# Patient Record
Sex: Female | Born: 1935 | Race: White | Hispanic: No | Smoking: Former smoker
Health system: Southern US, Community
[De-identification: ages and names within clinical notes are randomized; demographics above are authoritative.]

## PROBLEM LIST (undated history)

## (undated) DIAGNOSIS — F419 Anxiety disorder, unspecified: Secondary | ICD-10-CM

## (undated) DIAGNOSIS — K219 Gastro-esophageal reflux disease without esophagitis: Secondary | ICD-10-CM

## (undated) DIAGNOSIS — E119 Type 2 diabetes mellitus without complications: Secondary | ICD-10-CM

## (undated) DIAGNOSIS — I10 Essential (primary) hypertension: Secondary | ICD-10-CM

## (undated) DIAGNOSIS — E041 Nontoxic single thyroid nodule: Secondary | ICD-10-CM

## (undated) DIAGNOSIS — C801 Malignant (primary) neoplasm, unspecified: Secondary | ICD-10-CM

## (undated) DIAGNOSIS — G629 Polyneuropathy, unspecified: Secondary | ICD-10-CM

## (undated) DIAGNOSIS — D510 Vitamin B12 deficiency anemia due to intrinsic factor deficiency: Secondary | ICD-10-CM

## (undated) DIAGNOSIS — F329 Major depressive disorder, single episode, unspecified: Secondary | ICD-10-CM

## (undated) DIAGNOSIS — C099 Malignant neoplasm of tonsil, unspecified: Secondary | ICD-10-CM

## (undated) HISTORY — PX: CHOLECYSTECTOMY: SHX55

## (undated) HISTORY — DX: Vitamin B12 deficiency anemia due to intrinsic factor deficiency: D51.0

## (undated) HISTORY — DX: Essential (primary) hypertension: I10

## (undated) HISTORY — DX: Polyneuropathy, unspecified: G62.9

## (undated) HISTORY — DX: Malignant (primary) neoplasm, unspecified: C80.1

## (undated) HISTORY — PX: BLADDER SURGERY: SHX569

## (undated) HISTORY — PX: APPENDECTOMY: SHX54

## (undated) HISTORY — PX: RECTAL SURGERY: SHX760

## (undated) HISTORY — DX: Malignant neoplasm of tonsil, unspecified: C09.9

## (undated) HISTORY — PX: ABDOMINAL HYSTERECTOMY: SHX81

---

## 2009-11-27 ENCOUNTER — Inpatient Hospital Stay (HOSPITAL_COMMUNITY): Admission: EM | Admit: 2009-11-27 | Discharge: 2009-12-01 | Payer: Self-pay | Admitting: Emergency Medicine

## 2010-03-06 ENCOUNTER — Emergency Department (HOSPITAL_COMMUNITY): Admission: EM | Admit: 2010-03-06 | Discharge: 2010-03-06 | Payer: Self-pay | Admitting: Emergency Medicine

## 2010-03-11 ENCOUNTER — Ambulatory Visit (HOSPITAL_COMMUNITY)
Admission: RE | Admit: 2010-03-11 | Discharge: 2010-03-11 | Payer: Self-pay | Source: Home / Self Care | Admitting: Family Medicine

## 2010-03-24 ENCOUNTER — Ambulatory Visit (HOSPITAL_COMMUNITY)
Admission: RE | Admit: 2010-03-24 | Discharge: 2010-03-24 | Payer: Self-pay | Source: Home / Self Care | Admitting: Family Medicine

## 2010-09-15 LAB — COMPREHENSIVE METABOLIC PANEL
ALT: 17 U/L (ref 0–35)
AST: 28 U/L (ref 0–37)
Albumin: 3.5 g/dL (ref 3.5–5.2)
Alkaline Phosphatase: 45 U/L (ref 39–117)
CO2: 25 mEq/L (ref 19–32)
Calcium: 8.8 mg/dL (ref 8.4–10.5)
Chloride: 102 mEq/L (ref 96–112)
GFR calc non Af Amer: >60 mL/min (ref >60)
Total Bilirubin: 0.5 mg/dL (ref 0.3–1.2)
Total Protein: 6.1 g/dL (ref 6.0–8.3)

## 2010-09-15 LAB — DIFFERENTIAL
Basophils Relative: 0 % (ref 0–1)
Eosinophils Relative: 2 % (ref 0–5)
Lymphs Abs: 1.1 10*3/uL (ref 0.7–4.0)
Monocytes Relative: 15 % — ABNORMAL HIGH (ref 3–12)
Neutrophils Relative %: 65 % (ref 43–77)

## 2010-09-15 LAB — URINALYSIS, ROUTINE W REFLEX MICROSCOPIC
Protein, ur: NEGATIVE mg/dL
Urobilinogen, UA: 0.2 mg/dL (ref 0.0–1.0)
pH: 6.5 (ref 5.0–8.0)

## 2010-09-15 LAB — CBC
Hemoglobin: 12 g/dL (ref 12.0–15.0)
RDW: 12.6 % (ref 11.5–15.5)
WBC: 6 10*3/uL (ref 4.0–10.5)

## 2010-09-19 LAB — HEMOCCULT GUIAC POC 1CARD (OFFICE): Fecal Occult Bld: NEGATIVE

## 2010-09-19 LAB — CBC
HCT: 32.6 % — ABNORMAL LOW (ref 36.0–46.0)
HCT: 33.8 % — ABNORMAL LOW (ref 36.0–46.0)
Hemoglobin: 11 g/dL — ABNORMAL LOW (ref 12.0–15.0)
Hemoglobin: 11.5 g/dL — ABNORMAL LOW (ref 12.0–15.0)
MCHC: 33.8 g/dL (ref 30.0–36.0)
MCHC: 34.5 g/dL (ref 30.0–36.0)
MCV: 93.9 fL (ref 78.0–100.0)
MCV: 94.5 fL (ref 78.0–100.0)
Platelets: 215 10*3/uL (ref 150–400)
Platelets: 148 10*3/uL — ABNORMAL LOW (ref 150–400)
Platelets: 174 10*3/uL (ref 150–400)
Platelets: 178 10*3/uL (ref 150–400)
RBC: 3.81 MIL/uL — ABNORMAL LOW (ref 3.87–5.11)
RBC: 3.45 MIL/uL — ABNORMAL LOW (ref 3.87–5.11)
RDW: 12.1 % (ref 11.5–15.5)
RDW: 12.5 % (ref 11.5–15.5)
WBC: 11.9 10*3/uL — ABNORMAL HIGH (ref 4.0–10.5)
WBC: 10.6 10*3/uL — ABNORMAL HIGH (ref 4.0–10.5)
WBC: 8.5 10*3/uL (ref 4.0–10.5)

## 2010-09-19 LAB — CLOSTRIDIUM DIFFICILE EIA: C difficile Toxins A+B, EIA: NEGATIVE

## 2010-09-19 LAB — DIFFERENTIAL
Basophils Absolute: 0 10*3/uL (ref 0.0–0.1)
Basophils Absolute: 0 10*3/uL (ref 0.0–0.1)
Basophils Relative: 0 % (ref 0–1)
Basophils Relative: 0 % (ref 0–1)
Basophils Relative: 0 % (ref 0–1)
Basophils Relative: 1 % (ref 0–1)
Eosinophils Absolute: 0.1 10*3/uL (ref 0.0–0.7)
Eosinophils Absolute: 0.2 10*3/uL (ref 0.0–0.7)
Eosinophils Relative: 2 % (ref 0–5)
Eosinophils Relative: 1 % (ref 0–5)
Eosinophils Relative: 3 % (ref 0–5)
Lymphocytes Relative: 11 % — ABNORMAL LOW (ref 12–46)
Lymphocytes Relative: 12 % (ref 12–46)
Lymphocytes Relative: 15 % (ref 12–46)
Lymphs Abs: 1.3 10*3/uL (ref 0.7–4.0)
Monocytes Absolute: 0.9 10*3/uL (ref 0.1–1.0)
Monocytes Absolute: 1.3 10*3/uL — ABNORMAL HIGH (ref 0.1–1.0)
Monocytes Relative: 8 % (ref 3–12)
Monocytes Relative: 11 % (ref 3–12)
Monocytes Relative: 9 % (ref 3–12)
Neutro Abs: 9.3 10*3/uL — ABNORMAL HIGH (ref 1.7–7.7)
Neutro Abs: 6 10*3/uL (ref 1.7–7.7)
Neutro Abs: 8 10*3/uL — ABNORMAL HIGH (ref 1.7–7.7)
Neutrophils Relative %: 71 % (ref 43–77)
Neutrophils Relative %: 76 % (ref 43–77)

## 2010-09-19 LAB — BASIC METABOLIC PANEL
BUN: 7 mg/dL (ref 6–23)
CO2: 23 mEq/L (ref 19–32)
Calcium: 7.6 mg/dL — ABNORMAL LOW (ref 8.4–10.5)
Chloride: 110 mEq/L (ref 96–112)
Creatinine, Ser: 0.7 mg/dL (ref 0.4–1.2)
GFR calc Af Amer: >60 mL/min (ref >60)
GFR calc non Af Amer: >60 mL/min (ref >60)
GFR calc non Af Amer: >60 mL/min (ref >60)
Glucose, Bld: 129 mg/dL — ABNORMAL HIGH (ref 70–99)
Glucose, Bld: 135 mg/dL — ABNORMAL HIGH (ref 70–99)
Potassium: 4.1 mEq/L (ref 3.5–5.1)
Potassium: 3.4 mEq/L — ABNORMAL LOW (ref 3.5–5.1)
Sodium: 139 mEq/L (ref 135–145)

## 2010-09-19 LAB — COMPREHENSIVE METABOLIC PANEL
Albumin: 3 g/dL — ABNORMAL LOW (ref 3.5–5.2)
Alkaline Phosphatase: 37 U/L — ABNORMAL LOW (ref 39–117)
Alkaline Phosphatase: 43 U/L (ref 39–117)
BUN: 10 mg/dL (ref 6–23)
CO2: 27 mEq/L (ref 19–32)
CO2: 27 mEq/L (ref 19–32)
Calcium: 8.7 mg/dL (ref 8.4–10.5)
Chloride: 100 mEq/L (ref 96–112)
Chloride: 104 mEq/L (ref 96–112)
Creatinine, Ser: 0.78 mg/dL (ref 0.4–1.2)
Creatinine, Ser: 0.83 mg/dL (ref 0.4–1.2)
GFR calc Af Amer: >60 mL/min (ref >60)
GFR calc non Af Amer: >60 mL/min (ref >60)
GFR calc non Af Amer: >60 mL/min (ref >60)
Glucose, Bld: 158 mg/dL — ABNORMAL HIGH (ref 70–99)
Potassium: 2.8 mEq/L — ABNORMAL LOW (ref 3.5–5.1)
Potassium: 3.3 mEq/L — ABNORMAL LOW (ref 3.5–5.1)
Sodium: 136 mEq/L (ref 135–145)
Total Bilirubin: 0.7 mg/dL (ref 0.3–1.2)

## 2010-09-19 LAB — URINALYSIS, ROUTINE W REFLEX MICROSCOPIC
Glucose, UA: NEGATIVE mg/dL
Nitrite: NEGATIVE
Protein, ur: NEGATIVE mg/dL
pH: 5 (ref 5.0–8.0)

## 2010-09-19 LAB — LIPASE, BLOOD: Lipase: 19 U/L (ref 11–59)

## 2010-09-19 LAB — GLUCOSE, CAPILLARY
Glucose-Capillary: 116 mg/dL — ABNORMAL HIGH (ref 70–99)
Glucose-Capillary: 125 mg/dL — ABNORMAL HIGH (ref 70–99)
Glucose-Capillary: 156 mg/dL — ABNORMAL HIGH (ref 70–99)
Glucose-Capillary: 176 mg/dL — ABNORMAL HIGH (ref 70–99)

## 2010-09-19 LAB — MRSA PCR SCREENING: MRSA by PCR: NEGATIVE

## 2010-09-19 LAB — MAGNESIUM: Magnesium: 1.4 mg/dL — ABNORMAL LOW (ref 1.5–2.5)

## 2010-09-19 LAB — CULTURE, BLOOD (ROUTINE X 2): Report Status: 6022011

## 2011-06-20 DIAGNOSIS — M159 Polyosteoarthritis, unspecified: Secondary | ICD-10-CM | POA: Diagnosis not present

## 2011-06-20 DIAGNOSIS — M069 Rheumatoid arthritis, unspecified: Secondary | ICD-10-CM | POA: Diagnosis not present

## 2011-06-20 DIAGNOSIS — I951 Orthostatic hypotension: Secondary | ICD-10-CM | POA: Diagnosis not present

## 2011-07-06 DIAGNOSIS — I10 Essential (primary) hypertension: Secondary | ICD-10-CM | POA: Diagnosis not present

## 2011-07-06 DIAGNOSIS — E785 Hyperlipidemia, unspecified: Secondary | ICD-10-CM | POA: Diagnosis not present

## 2011-07-06 DIAGNOSIS — E119 Type 2 diabetes mellitus without complications: Secondary | ICD-10-CM | POA: Diagnosis not present

## 2011-07-06 DIAGNOSIS — D518 Other vitamin B12 deficiency anemias: Secondary | ICD-10-CM | POA: Diagnosis not present

## 2011-07-10 DIAGNOSIS — M159 Polyosteoarthritis, unspecified: Secondary | ICD-10-CM | POA: Diagnosis not present

## 2011-07-10 DIAGNOSIS — I951 Orthostatic hypotension: Secondary | ICD-10-CM | POA: Diagnosis not present

## 2011-07-10 DIAGNOSIS — M069 Rheumatoid arthritis, unspecified: Secondary | ICD-10-CM | POA: Diagnosis not present

## 2011-07-11 DIAGNOSIS — C44320 Squamous cell carcinoma of skin of unspecified parts of face: Secondary | ICD-10-CM | POA: Diagnosis not present

## 2011-07-11 DIAGNOSIS — L570 Actinic keratosis: Secondary | ICD-10-CM | POA: Diagnosis not present

## 2011-07-17 DIAGNOSIS — G5790 Unspecified mononeuropathy of unspecified lower limb: Secondary | ICD-10-CM | POA: Diagnosis not present

## 2011-07-17 DIAGNOSIS — B351 Tinea unguium: Secondary | ICD-10-CM | POA: Diagnosis not present

## 2011-07-17 DIAGNOSIS — M2040 Other hammer toe(s) (acquired), unspecified foot: Secondary | ICD-10-CM | POA: Diagnosis not present

## 2011-07-17 DIAGNOSIS — E1149 Type 2 diabetes mellitus with other diabetic neurological complication: Secondary | ICD-10-CM | POA: Diagnosis not present

## 2011-07-21 DIAGNOSIS — M159 Polyosteoarthritis, unspecified: Secondary | ICD-10-CM | POA: Diagnosis not present

## 2011-07-21 DIAGNOSIS — M069 Rheumatoid arthritis, unspecified: Secondary | ICD-10-CM | POA: Diagnosis not present

## 2011-07-21 DIAGNOSIS — I951 Orthostatic hypotension: Secondary | ICD-10-CM | POA: Diagnosis not present

## 2011-07-25 DIAGNOSIS — S62009A Unspecified fracture of navicular [scaphoid] bone of unspecified wrist, initial encounter for closed fracture: Secondary | ICD-10-CM | POA: Diagnosis not present

## 2011-07-25 DIAGNOSIS — S60219A Contusion of unspecified wrist, initial encounter: Secondary | ICD-10-CM | POA: Diagnosis not present

## 2011-07-25 DIAGNOSIS — S59919A Unspecified injury of unspecified forearm, initial encounter: Secondary | ICD-10-CM | POA: Diagnosis not present

## 2011-07-28 DIAGNOSIS — M159 Polyosteoarthritis, unspecified: Secondary | ICD-10-CM | POA: Diagnosis not present

## 2011-07-28 DIAGNOSIS — M069 Rheumatoid arthritis, unspecified: Secondary | ICD-10-CM | POA: Diagnosis not present

## 2011-07-28 DIAGNOSIS — I951 Orthostatic hypotension: Secondary | ICD-10-CM | POA: Diagnosis not present

## 2011-08-01 DIAGNOSIS — S62009A Unspecified fracture of navicular [scaphoid] bone of unspecified wrist, initial encounter for closed fracture: Secondary | ICD-10-CM | POA: Diagnosis not present

## 2011-08-01 DIAGNOSIS — S59909A Unspecified injury of unspecified elbow, initial encounter: Secondary | ICD-10-CM | POA: Diagnosis not present

## 2011-08-01 DIAGNOSIS — S60219A Contusion of unspecified wrist, initial encounter: Secondary | ICD-10-CM | POA: Diagnosis not present

## 2011-08-04 DIAGNOSIS — I951 Orthostatic hypotension: Secondary | ICD-10-CM | POA: Diagnosis not present

## 2011-08-04 DIAGNOSIS — M159 Polyosteoarthritis, unspecified: Secondary | ICD-10-CM | POA: Diagnosis not present

## 2011-08-04 DIAGNOSIS — M069 Rheumatoid arthritis, unspecified: Secondary | ICD-10-CM | POA: Diagnosis not present

## 2011-08-08 DIAGNOSIS — S6990XA Unspecified injury of unspecified wrist, hand and finger(s), initial encounter: Secondary | ICD-10-CM | POA: Diagnosis not present

## 2011-08-08 DIAGNOSIS — S59909A Unspecified injury of unspecified elbow, initial encounter: Secondary | ICD-10-CM | POA: Diagnosis not present

## 2011-08-08 DIAGNOSIS — S60219A Contusion of unspecified wrist, initial encounter: Secondary | ICD-10-CM | POA: Diagnosis not present

## 2011-08-08 DIAGNOSIS — S62009A Unspecified fracture of navicular [scaphoid] bone of unspecified wrist, initial encounter for closed fracture: Secondary | ICD-10-CM | POA: Diagnosis not present

## 2011-08-09 DIAGNOSIS — M069 Rheumatoid arthritis, unspecified: Secondary | ICD-10-CM | POA: Diagnosis not present

## 2011-08-09 DIAGNOSIS — I951 Orthostatic hypotension: Secondary | ICD-10-CM | POA: Diagnosis not present

## 2011-08-09 DIAGNOSIS — M159 Polyosteoarthritis, unspecified: Secondary | ICD-10-CM | POA: Diagnosis not present

## 2011-08-10 DIAGNOSIS — M159 Polyosteoarthritis, unspecified: Secondary | ICD-10-CM | POA: Diagnosis not present

## 2011-08-10 DIAGNOSIS — M069 Rheumatoid arthritis, unspecified: Secondary | ICD-10-CM | POA: Diagnosis not present

## 2011-08-10 DIAGNOSIS — I951 Orthostatic hypotension: Secondary | ICD-10-CM | POA: Diagnosis not present

## 2011-08-12 DIAGNOSIS — M069 Rheumatoid arthritis, unspecified: Secondary | ICD-10-CM | POA: Diagnosis not present

## 2011-08-12 DIAGNOSIS — I951 Orthostatic hypotension: Secondary | ICD-10-CM | POA: Diagnosis not present

## 2011-08-12 DIAGNOSIS — M159 Polyosteoarthritis, unspecified: Secondary | ICD-10-CM | POA: Diagnosis not present

## 2011-08-14 DIAGNOSIS — I10 Essential (primary) hypertension: Secondary | ICD-10-CM | POA: Diagnosis not present

## 2011-08-14 DIAGNOSIS — E785 Hyperlipidemia, unspecified: Secondary | ICD-10-CM | POA: Diagnosis not present

## 2011-08-14 DIAGNOSIS — E119 Type 2 diabetes mellitus without complications: Secondary | ICD-10-CM | POA: Diagnosis not present

## 2011-08-14 DIAGNOSIS — D518 Other vitamin B12 deficiency anemias: Secondary | ICD-10-CM | POA: Diagnosis not present

## 2011-08-15 DIAGNOSIS — I951 Orthostatic hypotension: Secondary | ICD-10-CM | POA: Diagnosis not present

## 2011-08-15 DIAGNOSIS — M159 Polyosteoarthritis, unspecified: Secondary | ICD-10-CM | POA: Diagnosis not present

## 2011-08-15 DIAGNOSIS — M069 Rheumatoid arthritis, unspecified: Secondary | ICD-10-CM | POA: Diagnosis not present

## 2011-08-16 DIAGNOSIS — M069 Rheumatoid arthritis, unspecified: Secondary | ICD-10-CM | POA: Diagnosis not present

## 2011-08-16 DIAGNOSIS — M159 Polyosteoarthritis, unspecified: Secondary | ICD-10-CM | POA: Diagnosis not present

## 2011-08-16 DIAGNOSIS — I951 Orthostatic hypotension: Secondary | ICD-10-CM | POA: Diagnosis not present

## 2011-08-18 DIAGNOSIS — I951 Orthostatic hypotension: Secondary | ICD-10-CM | POA: Diagnosis not present

## 2011-08-18 DIAGNOSIS — M069 Rheumatoid arthritis, unspecified: Secondary | ICD-10-CM | POA: Diagnosis not present

## 2011-08-18 DIAGNOSIS — M159 Polyosteoarthritis, unspecified: Secondary | ICD-10-CM | POA: Diagnosis not present

## 2011-08-19 DIAGNOSIS — M159 Polyosteoarthritis, unspecified: Secondary | ICD-10-CM | POA: Diagnosis not present

## 2011-08-19 DIAGNOSIS — Z4789 Encounter for other orthopedic aftercare: Secondary | ICD-10-CM | POA: Diagnosis not present

## 2011-08-19 DIAGNOSIS — S62109A Fracture of unspecified carpal bone, unspecified wrist, initial encounter for closed fracture: Secondary | ICD-10-CM | POA: Diagnosis not present

## 2011-08-19 DIAGNOSIS — E119 Type 2 diabetes mellitus without complications: Secondary | ICD-10-CM | POA: Diagnosis not present

## 2011-08-19 DIAGNOSIS — M069 Rheumatoid arthritis, unspecified: Secondary | ICD-10-CM | POA: Diagnosis not present

## 2011-08-19 DIAGNOSIS — I159 Secondary hypertension, unspecified: Secondary | ICD-10-CM | POA: Diagnosis not present

## 2011-08-21 DIAGNOSIS — Z4789 Encounter for other orthopedic aftercare: Secondary | ICD-10-CM | POA: Diagnosis not present

## 2011-08-21 DIAGNOSIS — M069 Rheumatoid arthritis, unspecified: Secondary | ICD-10-CM | POA: Diagnosis not present

## 2011-08-21 DIAGNOSIS — I159 Secondary hypertension, unspecified: Secondary | ICD-10-CM | POA: Diagnosis not present

## 2011-08-21 DIAGNOSIS — M159 Polyosteoarthritis, unspecified: Secondary | ICD-10-CM | POA: Diagnosis not present

## 2011-08-21 DIAGNOSIS — E119 Type 2 diabetes mellitus without complications: Secondary | ICD-10-CM | POA: Diagnosis not present

## 2011-08-21 DIAGNOSIS — S62109A Fracture of unspecified carpal bone, unspecified wrist, initial encounter for closed fracture: Secondary | ICD-10-CM | POA: Diagnosis not present

## 2011-08-22 DIAGNOSIS — Z4789 Encounter for other orthopedic aftercare: Secondary | ICD-10-CM | POA: Diagnosis not present

## 2011-08-22 DIAGNOSIS — M069 Rheumatoid arthritis, unspecified: Secondary | ICD-10-CM | POA: Diagnosis not present

## 2011-08-22 DIAGNOSIS — I159 Secondary hypertension, unspecified: Secondary | ICD-10-CM | POA: Diagnosis not present

## 2011-08-22 DIAGNOSIS — S62109A Fracture of unspecified carpal bone, unspecified wrist, initial encounter for closed fracture: Secondary | ICD-10-CM | POA: Diagnosis not present

## 2011-08-22 DIAGNOSIS — E119 Type 2 diabetes mellitus without complications: Secondary | ICD-10-CM | POA: Diagnosis not present

## 2011-08-22 DIAGNOSIS — M159 Polyosteoarthritis, unspecified: Secondary | ICD-10-CM | POA: Diagnosis not present

## 2011-08-24 DIAGNOSIS — I159 Secondary hypertension, unspecified: Secondary | ICD-10-CM | POA: Diagnosis not present

## 2011-08-24 DIAGNOSIS — E119 Type 2 diabetes mellitus without complications: Secondary | ICD-10-CM | POA: Diagnosis not present

## 2011-08-24 DIAGNOSIS — Z4789 Encounter for other orthopedic aftercare: Secondary | ICD-10-CM | POA: Diagnosis not present

## 2011-08-24 DIAGNOSIS — M069 Rheumatoid arthritis, unspecified: Secondary | ICD-10-CM | POA: Diagnosis not present

## 2011-08-24 DIAGNOSIS — M159 Polyosteoarthritis, unspecified: Secondary | ICD-10-CM | POA: Diagnosis not present

## 2011-08-24 DIAGNOSIS — S62109A Fracture of unspecified carpal bone, unspecified wrist, initial encounter for closed fracture: Secondary | ICD-10-CM | POA: Diagnosis not present

## 2011-08-25 DIAGNOSIS — M159 Polyosteoarthritis, unspecified: Secondary | ICD-10-CM | POA: Diagnosis not present

## 2011-08-25 DIAGNOSIS — M069 Rheumatoid arthritis, unspecified: Secondary | ICD-10-CM | POA: Diagnosis not present

## 2011-08-25 DIAGNOSIS — I159 Secondary hypertension, unspecified: Secondary | ICD-10-CM | POA: Diagnosis not present

## 2011-08-25 DIAGNOSIS — Z4789 Encounter for other orthopedic aftercare: Secondary | ICD-10-CM | POA: Diagnosis not present

## 2011-08-25 DIAGNOSIS — S62109A Fracture of unspecified carpal bone, unspecified wrist, initial encounter for closed fracture: Secondary | ICD-10-CM | POA: Diagnosis not present

## 2011-08-25 DIAGNOSIS — E119 Type 2 diabetes mellitus without complications: Secondary | ICD-10-CM | POA: Diagnosis not present

## 2011-08-28 DIAGNOSIS — R4789 Other speech disturbances: Secondary | ICD-10-CM | POA: Diagnosis not present

## 2011-08-28 DIAGNOSIS — I10 Essential (primary) hypertension: Secondary | ICD-10-CM | POA: Diagnosis not present

## 2011-08-28 DIAGNOSIS — Z85819 Personal history of malignant neoplasm of unspecified site of lip, oral cavity, and pharynx: Secondary | ICD-10-CM | POA: Diagnosis not present

## 2011-08-28 DIAGNOSIS — N390 Urinary tract infection, site not specified: Secondary | ICD-10-CM | POA: Diagnosis not present

## 2011-08-28 DIAGNOSIS — Z9181 History of falling: Secondary | ICD-10-CM | POA: Diagnosis not present

## 2011-08-28 DIAGNOSIS — R262 Difficulty in walking, not elsewhere classified: Secondary | ICD-10-CM | POA: Diagnosis not present

## 2011-08-28 DIAGNOSIS — IMO0001 Reserved for inherently not codable concepts without codable children: Secondary | ICD-10-CM | POA: Diagnosis not present

## 2011-08-28 DIAGNOSIS — F3289 Other specified depressive episodes: Secondary | ICD-10-CM | POA: Diagnosis not present

## 2011-08-28 DIAGNOSIS — E119 Type 2 diabetes mellitus without complications: Secondary | ICD-10-CM | POA: Diagnosis not present

## 2011-08-28 DIAGNOSIS — F329 Major depressive disorder, single episode, unspecified: Secondary | ICD-10-CM | POA: Diagnosis not present

## 2011-08-28 DIAGNOSIS — G8190 Hemiplegia, unspecified affecting unspecified side: Secondary | ICD-10-CM | POA: Diagnosis not present

## 2011-08-28 DIAGNOSIS — I669 Occlusion and stenosis of unspecified cerebral artery: Secondary | ICD-10-CM | POA: Diagnosis not present

## 2011-08-28 DIAGNOSIS — G609 Hereditary and idiopathic neuropathy, unspecified: Secondary | ICD-10-CM | POA: Diagnosis not present

## 2011-08-28 DIAGNOSIS — R279 Unspecified lack of coordination: Secondary | ICD-10-CM | POA: Diagnosis not present

## 2011-08-28 DIAGNOSIS — R269 Unspecified abnormalities of gait and mobility: Secondary | ICD-10-CM | POA: Diagnosis not present

## 2011-08-28 DIAGNOSIS — Z5189 Encounter for other specified aftercare: Secondary | ICD-10-CM | POA: Diagnosis not present

## 2011-08-28 DIAGNOSIS — W19XXXA Unspecified fall, initial encounter: Secondary | ICD-10-CM | POA: Diagnosis not present

## 2011-08-28 DIAGNOSIS — I6350 Cerebral infarction due to unspecified occlusion or stenosis of unspecified cerebral artery: Secondary | ICD-10-CM | POA: Diagnosis not present

## 2011-08-28 DIAGNOSIS — M1990 Unspecified osteoarthritis, unspecified site: Secondary | ICD-10-CM | POA: Diagnosis not present

## 2011-08-28 DIAGNOSIS — R51 Headache: Secondary | ICD-10-CM | POA: Diagnosis not present

## 2011-08-28 DIAGNOSIS — I6789 Other cerebrovascular disease: Secondary | ICD-10-CM | POA: Diagnosis not present

## 2011-08-28 DIAGNOSIS — F411 Generalized anxiety disorder: Secondary | ICD-10-CM | POA: Diagnosis not present

## 2011-08-28 DIAGNOSIS — D649 Anemia, unspecified: Secondary | ICD-10-CM | POA: Diagnosis not present

## 2011-08-28 DIAGNOSIS — R55 Syncope and collapse: Secondary | ICD-10-CM | POA: Diagnosis not present

## 2011-08-28 DIAGNOSIS — S5290XD Unspecified fracture of unspecified forearm, subsequent encounter for closed fracture with routine healing: Secondary | ICD-10-CM | POA: Diagnosis not present

## 2011-08-31 DIAGNOSIS — M1990 Unspecified osteoarthritis, unspecified site: Secondary | ICD-10-CM | POA: Diagnosis not present

## 2011-08-31 DIAGNOSIS — E1149 Type 2 diabetes mellitus with other diabetic neurological complication: Secondary | ICD-10-CM | POA: Diagnosis not present

## 2011-08-31 DIAGNOSIS — Z5189 Encounter for other specified aftercare: Secondary | ICD-10-CM | POA: Diagnosis not present

## 2011-08-31 DIAGNOSIS — I10 Essential (primary) hypertension: Secondary | ICD-10-CM | POA: Diagnosis not present

## 2011-08-31 DIAGNOSIS — R262 Difficulty in walking, not elsewhere classified: Secondary | ICD-10-CM | POA: Diagnosis not present

## 2011-08-31 DIAGNOSIS — W19XXXA Unspecified fall, initial encounter: Secondary | ICD-10-CM | POA: Diagnosis not present

## 2011-08-31 DIAGNOSIS — R269 Unspecified abnormalities of gait and mobility: Secondary | ICD-10-CM | POA: Diagnosis not present

## 2011-08-31 DIAGNOSIS — D649 Anemia, unspecified: Secondary | ICD-10-CM | POA: Diagnosis not present

## 2011-08-31 DIAGNOSIS — N390 Urinary tract infection, site not specified: Secondary | ICD-10-CM | POA: Diagnosis not present

## 2011-08-31 DIAGNOSIS — G5790 Unspecified mononeuropathy of unspecified lower limb: Secondary | ICD-10-CM | POA: Diagnosis not present

## 2011-08-31 DIAGNOSIS — R4789 Other speech disturbances: Secondary | ICD-10-CM | POA: Diagnosis not present

## 2011-08-31 DIAGNOSIS — S5290XD Unspecified fracture of unspecified forearm, subsequent encounter for closed fracture with routine healing: Secondary | ICD-10-CM | POA: Diagnosis not present

## 2011-08-31 DIAGNOSIS — F411 Generalized anxiety disorder: Secondary | ICD-10-CM | POA: Diagnosis not present

## 2011-08-31 DIAGNOSIS — J309 Allergic rhinitis, unspecified: Secondary | ICD-10-CM | POA: Diagnosis not present

## 2011-08-31 DIAGNOSIS — R279 Unspecified lack of coordination: Secondary | ICD-10-CM | POA: Diagnosis not present

## 2011-08-31 DIAGNOSIS — B351 Tinea unguium: Secondary | ICD-10-CM | POA: Diagnosis not present

## 2011-08-31 DIAGNOSIS — Z4789 Encounter for other orthopedic aftercare: Secondary | ICD-10-CM | POA: Diagnosis not present

## 2011-08-31 DIAGNOSIS — IMO0001 Reserved for inherently not codable concepts without codable children: Secondary | ICD-10-CM | POA: Diagnosis not present

## 2011-08-31 DIAGNOSIS — H8110 Benign paroxysmal vertigo, unspecified ear: Secondary | ICD-10-CM | POA: Diagnosis not present

## 2011-08-31 DIAGNOSIS — G5600 Carpal tunnel syndrome, unspecified upper limb: Secondary | ICD-10-CM | POA: Diagnosis not present

## 2011-08-31 DIAGNOSIS — E119 Type 2 diabetes mellitus without complications: Secondary | ICD-10-CM | POA: Diagnosis not present

## 2011-09-05 DIAGNOSIS — Z4789 Encounter for other orthopedic aftercare: Secondary | ICD-10-CM | POA: Diagnosis not present

## 2011-09-05 DIAGNOSIS — E119 Type 2 diabetes mellitus without complications: Secondary | ICD-10-CM | POA: Diagnosis not present

## 2011-09-05 DIAGNOSIS — N390 Urinary tract infection, site not specified: Secondary | ICD-10-CM | POA: Diagnosis not present

## 2011-09-05 DIAGNOSIS — R269 Unspecified abnormalities of gait and mobility: Secondary | ICD-10-CM | POA: Diagnosis not present

## 2011-09-05 DIAGNOSIS — J309 Allergic rhinitis, unspecified: Secondary | ICD-10-CM | POA: Diagnosis not present

## 2011-09-18 DIAGNOSIS — E1149 Type 2 diabetes mellitus with other diabetic neurological complication: Secondary | ICD-10-CM | POA: Diagnosis not present

## 2011-09-18 DIAGNOSIS — B351 Tinea unguium: Secondary | ICD-10-CM | POA: Diagnosis not present

## 2011-09-18 DIAGNOSIS — G5790 Unspecified mononeuropathy of unspecified lower limb: Secondary | ICD-10-CM | POA: Diagnosis not present

## 2011-09-22 DIAGNOSIS — G5600 Carpal tunnel syndrome, unspecified upper limb: Secondary | ICD-10-CM | POA: Diagnosis not present

## 2011-09-22 DIAGNOSIS — H8110 Benign paroxysmal vertigo, unspecified ear: Secondary | ICD-10-CM | POA: Diagnosis not present

## 2011-10-02 DIAGNOSIS — I159 Secondary hypertension, unspecified: Secondary | ICD-10-CM | POA: Diagnosis not present

## 2011-10-02 DIAGNOSIS — M069 Rheumatoid arthritis, unspecified: Secondary | ICD-10-CM | POA: Diagnosis not present

## 2011-10-02 DIAGNOSIS — Z4789 Encounter for other orthopedic aftercare: Secondary | ICD-10-CM | POA: Diagnosis not present

## 2011-10-02 DIAGNOSIS — S62109A Fracture of unspecified carpal bone, unspecified wrist, initial encounter for closed fracture: Secondary | ICD-10-CM | POA: Diagnosis not present

## 2011-10-02 DIAGNOSIS — M159 Polyosteoarthritis, unspecified: Secondary | ICD-10-CM | POA: Diagnosis not present

## 2011-10-02 DIAGNOSIS — E119 Type 2 diabetes mellitus without complications: Secondary | ICD-10-CM | POA: Diagnosis not present

## 2011-10-03 DIAGNOSIS — S62109A Fracture of unspecified carpal bone, unspecified wrist, initial encounter for closed fracture: Secondary | ICD-10-CM | POA: Diagnosis not present

## 2011-10-03 DIAGNOSIS — I159 Secondary hypertension, unspecified: Secondary | ICD-10-CM | POA: Diagnosis not present

## 2011-10-03 DIAGNOSIS — E119 Type 2 diabetes mellitus without complications: Secondary | ICD-10-CM | POA: Diagnosis not present

## 2011-10-03 DIAGNOSIS — Z4789 Encounter for other orthopedic aftercare: Secondary | ICD-10-CM | POA: Diagnosis not present

## 2011-10-03 DIAGNOSIS — M069 Rheumatoid arthritis, unspecified: Secondary | ICD-10-CM | POA: Diagnosis not present

## 2011-10-03 DIAGNOSIS — M159 Polyosteoarthritis, unspecified: Secondary | ICD-10-CM | POA: Diagnosis not present

## 2011-10-05 DIAGNOSIS — S60219A Contusion of unspecified wrist, initial encounter: Secondary | ICD-10-CM | POA: Diagnosis not present

## 2011-10-05 DIAGNOSIS — Z4789 Encounter for other orthopedic aftercare: Secondary | ICD-10-CM | POA: Diagnosis not present

## 2011-10-05 DIAGNOSIS — M069 Rheumatoid arthritis, unspecified: Secondary | ICD-10-CM | POA: Diagnosis not present

## 2011-10-05 DIAGNOSIS — S59909A Unspecified injury of unspecified elbow, initial encounter: Secondary | ICD-10-CM | POA: Diagnosis not present

## 2011-10-05 DIAGNOSIS — S62009A Unspecified fracture of navicular [scaphoid] bone of unspecified wrist, initial encounter for closed fracture: Secondary | ICD-10-CM | POA: Diagnosis not present

## 2011-10-05 DIAGNOSIS — E119 Type 2 diabetes mellitus without complications: Secondary | ICD-10-CM | POA: Diagnosis not present

## 2011-10-05 DIAGNOSIS — I159 Secondary hypertension, unspecified: Secondary | ICD-10-CM | POA: Diagnosis not present

## 2011-10-05 DIAGNOSIS — S59919A Unspecified injury of unspecified forearm, initial encounter: Secondary | ICD-10-CM | POA: Diagnosis not present

## 2011-10-05 DIAGNOSIS — M159 Polyosteoarthritis, unspecified: Secondary | ICD-10-CM | POA: Diagnosis not present

## 2011-10-05 DIAGNOSIS — S62109A Fracture of unspecified carpal bone, unspecified wrist, initial encounter for closed fracture: Secondary | ICD-10-CM | POA: Diagnosis not present

## 2011-10-06 DIAGNOSIS — M659 Synovitis and tenosynovitis, unspecified: Secondary | ICD-10-CM | POA: Diagnosis not present

## 2011-10-06 DIAGNOSIS — M79609 Pain in unspecified limb: Secondary | ICD-10-CM | POA: Diagnosis not present

## 2011-10-09 DIAGNOSIS — E119 Type 2 diabetes mellitus without complications: Secondary | ICD-10-CM | POA: Diagnosis not present

## 2011-10-09 DIAGNOSIS — I10 Essential (primary) hypertension: Secondary | ICD-10-CM | POA: Diagnosis not present

## 2011-10-09 DIAGNOSIS — D518 Other vitamin B12 deficiency anemias: Secondary | ICD-10-CM | POA: Diagnosis not present

## 2011-10-23 DIAGNOSIS — G4700 Insomnia, unspecified: Secondary | ICD-10-CM | POA: Diagnosis not present

## 2011-10-23 DIAGNOSIS — I10 Essential (primary) hypertension: Secondary | ICD-10-CM | POA: Diagnosis not present

## 2011-10-23 DIAGNOSIS — G5790 Unspecified mononeuropathy of unspecified lower limb: Secondary | ICD-10-CM | POA: Diagnosis not present

## 2011-10-25 DIAGNOSIS — I10 Essential (primary) hypertension: Secondary | ICD-10-CM | POA: Diagnosis not present

## 2011-10-25 DIAGNOSIS — G609 Hereditary and idiopathic neuropathy, unspecified: Secondary | ICD-10-CM | POA: Diagnosis not present

## 2011-10-25 DIAGNOSIS — D510 Vitamin B12 deficiency anemia due to intrinsic factor deficiency: Secondary | ICD-10-CM | POA: Diagnosis not present

## 2011-10-25 DIAGNOSIS — Z794 Long term (current) use of insulin: Secondary | ICD-10-CM | POA: Diagnosis not present

## 2011-10-25 DIAGNOSIS — E119 Type 2 diabetes mellitus without complications: Secondary | ICD-10-CM | POA: Diagnosis not present

## 2011-10-25 DIAGNOSIS — R279 Unspecified lack of coordination: Secondary | ICD-10-CM | POA: Diagnosis not present

## 2011-10-28 DIAGNOSIS — D510 Vitamin B12 deficiency anemia due to intrinsic factor deficiency: Secondary | ICD-10-CM | POA: Diagnosis not present

## 2011-10-28 DIAGNOSIS — Z794 Long term (current) use of insulin: Secondary | ICD-10-CM | POA: Diagnosis not present

## 2011-10-28 DIAGNOSIS — I10 Essential (primary) hypertension: Secondary | ICD-10-CM | POA: Diagnosis not present

## 2011-10-28 DIAGNOSIS — G609 Hereditary and idiopathic neuropathy, unspecified: Secondary | ICD-10-CM | POA: Diagnosis not present

## 2011-10-28 DIAGNOSIS — R279 Unspecified lack of coordination: Secondary | ICD-10-CM | POA: Diagnosis not present

## 2011-10-28 DIAGNOSIS — E119 Type 2 diabetes mellitus without complications: Secondary | ICD-10-CM | POA: Diagnosis not present

## 2011-11-01 DIAGNOSIS — I10 Essential (primary) hypertension: Secondary | ICD-10-CM | POA: Diagnosis not present

## 2011-11-01 DIAGNOSIS — Z794 Long term (current) use of insulin: Secondary | ICD-10-CM | POA: Diagnosis not present

## 2011-11-01 DIAGNOSIS — E119 Type 2 diabetes mellitus without complications: Secondary | ICD-10-CM | POA: Diagnosis not present

## 2011-11-01 DIAGNOSIS — R279 Unspecified lack of coordination: Secondary | ICD-10-CM | POA: Diagnosis not present

## 2011-11-01 DIAGNOSIS — G609 Hereditary and idiopathic neuropathy, unspecified: Secondary | ICD-10-CM | POA: Diagnosis not present

## 2011-11-01 DIAGNOSIS — D510 Vitamin B12 deficiency anemia due to intrinsic factor deficiency: Secondary | ICD-10-CM | POA: Diagnosis not present

## 2011-11-02 DIAGNOSIS — E119 Type 2 diabetes mellitus without complications: Secondary | ICD-10-CM | POA: Diagnosis not present

## 2011-11-02 DIAGNOSIS — G609 Hereditary and idiopathic neuropathy, unspecified: Secondary | ICD-10-CM | POA: Diagnosis not present

## 2011-11-02 DIAGNOSIS — I10 Essential (primary) hypertension: Secondary | ICD-10-CM | POA: Diagnosis not present

## 2011-11-02 DIAGNOSIS — R279 Unspecified lack of coordination: Secondary | ICD-10-CM | POA: Diagnosis not present

## 2011-11-02 DIAGNOSIS — D510 Vitamin B12 deficiency anemia due to intrinsic factor deficiency: Secondary | ICD-10-CM | POA: Diagnosis not present

## 2011-11-02 DIAGNOSIS — Z794 Long term (current) use of insulin: Secondary | ICD-10-CM | POA: Diagnosis not present

## 2011-11-04 DIAGNOSIS — D510 Vitamin B12 deficiency anemia due to intrinsic factor deficiency: Secondary | ICD-10-CM | POA: Diagnosis not present

## 2011-11-04 DIAGNOSIS — R279 Unspecified lack of coordination: Secondary | ICD-10-CM | POA: Diagnosis not present

## 2011-11-04 DIAGNOSIS — Z794 Long term (current) use of insulin: Secondary | ICD-10-CM | POA: Diagnosis not present

## 2011-11-04 DIAGNOSIS — E119 Type 2 diabetes mellitus without complications: Secondary | ICD-10-CM | POA: Diagnosis not present

## 2011-11-04 DIAGNOSIS — G609 Hereditary and idiopathic neuropathy, unspecified: Secondary | ICD-10-CM | POA: Diagnosis not present

## 2011-11-04 DIAGNOSIS — I10 Essential (primary) hypertension: Secondary | ICD-10-CM | POA: Diagnosis not present

## 2011-11-06 DIAGNOSIS — D510 Vitamin B12 deficiency anemia due to intrinsic factor deficiency: Secondary | ICD-10-CM | POA: Diagnosis not present

## 2011-11-06 DIAGNOSIS — R279 Unspecified lack of coordination: Secondary | ICD-10-CM | POA: Diagnosis not present

## 2011-11-06 DIAGNOSIS — Z794 Long term (current) use of insulin: Secondary | ICD-10-CM | POA: Diagnosis not present

## 2011-11-06 DIAGNOSIS — G609 Hereditary and idiopathic neuropathy, unspecified: Secondary | ICD-10-CM | POA: Diagnosis not present

## 2011-11-06 DIAGNOSIS — E119 Type 2 diabetes mellitus without complications: Secondary | ICD-10-CM | POA: Diagnosis not present

## 2011-11-06 DIAGNOSIS — I10 Essential (primary) hypertension: Secondary | ICD-10-CM | POA: Diagnosis not present

## 2011-11-06 DIAGNOSIS — I69993 Ataxia following unspecified cerebrovascular disease: Secondary | ICD-10-CM | POA: Diagnosis not present

## 2011-11-08 DIAGNOSIS — D510 Vitamin B12 deficiency anemia due to intrinsic factor deficiency: Secondary | ICD-10-CM | POA: Diagnosis not present

## 2011-11-08 DIAGNOSIS — Z794 Long term (current) use of insulin: Secondary | ICD-10-CM | POA: Diagnosis not present

## 2011-11-08 DIAGNOSIS — G609 Hereditary and idiopathic neuropathy, unspecified: Secondary | ICD-10-CM | POA: Diagnosis not present

## 2011-11-08 DIAGNOSIS — R279 Unspecified lack of coordination: Secondary | ICD-10-CM | POA: Diagnosis not present

## 2011-11-08 DIAGNOSIS — I10 Essential (primary) hypertension: Secondary | ICD-10-CM | POA: Diagnosis not present

## 2011-11-08 DIAGNOSIS — E119 Type 2 diabetes mellitus without complications: Secondary | ICD-10-CM | POA: Diagnosis not present

## 2011-11-10 DIAGNOSIS — D510 Vitamin B12 deficiency anemia due to intrinsic factor deficiency: Secondary | ICD-10-CM | POA: Diagnosis not present

## 2011-11-10 DIAGNOSIS — R279 Unspecified lack of coordination: Secondary | ICD-10-CM | POA: Diagnosis not present

## 2011-11-10 DIAGNOSIS — Z794 Long term (current) use of insulin: Secondary | ICD-10-CM | POA: Diagnosis not present

## 2011-11-10 DIAGNOSIS — I10 Essential (primary) hypertension: Secondary | ICD-10-CM | POA: Diagnosis not present

## 2011-11-10 DIAGNOSIS — G609 Hereditary and idiopathic neuropathy, unspecified: Secondary | ICD-10-CM | POA: Diagnosis not present

## 2011-11-10 DIAGNOSIS — E119 Type 2 diabetes mellitus without complications: Secondary | ICD-10-CM | POA: Diagnosis not present

## 2011-11-14 DIAGNOSIS — I10 Essential (primary) hypertension: Secondary | ICD-10-CM | POA: Diagnosis not present

## 2011-11-14 DIAGNOSIS — D510 Vitamin B12 deficiency anemia due to intrinsic factor deficiency: Secondary | ICD-10-CM | POA: Diagnosis not present

## 2011-11-14 DIAGNOSIS — E119 Type 2 diabetes mellitus without complications: Secondary | ICD-10-CM | POA: Diagnosis not present

## 2011-11-14 DIAGNOSIS — R279 Unspecified lack of coordination: Secondary | ICD-10-CM | POA: Diagnosis not present

## 2011-11-14 DIAGNOSIS — G609 Hereditary and idiopathic neuropathy, unspecified: Secondary | ICD-10-CM | POA: Diagnosis not present

## 2011-11-14 DIAGNOSIS — Z794 Long term (current) use of insulin: Secondary | ICD-10-CM | POA: Diagnosis not present

## 2011-11-15 DIAGNOSIS — I10 Essential (primary) hypertension: Secondary | ICD-10-CM | POA: Diagnosis not present

## 2011-11-15 DIAGNOSIS — Z794 Long term (current) use of insulin: Secondary | ICD-10-CM | POA: Diagnosis not present

## 2011-11-15 DIAGNOSIS — R279 Unspecified lack of coordination: Secondary | ICD-10-CM | POA: Diagnosis not present

## 2011-11-15 DIAGNOSIS — G609 Hereditary and idiopathic neuropathy, unspecified: Secondary | ICD-10-CM | POA: Diagnosis not present

## 2011-11-15 DIAGNOSIS — E119 Type 2 diabetes mellitus without complications: Secondary | ICD-10-CM | POA: Diagnosis not present

## 2011-11-15 DIAGNOSIS — D510 Vitamin B12 deficiency anemia due to intrinsic factor deficiency: Secondary | ICD-10-CM | POA: Diagnosis not present

## 2011-11-20 DIAGNOSIS — R279 Unspecified lack of coordination: Secondary | ICD-10-CM | POA: Diagnosis not present

## 2011-11-20 DIAGNOSIS — D510 Vitamin B12 deficiency anemia due to intrinsic factor deficiency: Secondary | ICD-10-CM | POA: Diagnosis not present

## 2011-11-20 DIAGNOSIS — G609 Hereditary and idiopathic neuropathy, unspecified: Secondary | ICD-10-CM | POA: Diagnosis not present

## 2011-11-20 DIAGNOSIS — Z794 Long term (current) use of insulin: Secondary | ICD-10-CM | POA: Diagnosis not present

## 2011-11-20 DIAGNOSIS — I10 Essential (primary) hypertension: Secondary | ICD-10-CM | POA: Diagnosis not present

## 2011-11-20 DIAGNOSIS — E119 Type 2 diabetes mellitus without complications: Secondary | ICD-10-CM | POA: Diagnosis not present

## 2011-11-22 DIAGNOSIS — I10 Essential (primary) hypertension: Secondary | ICD-10-CM | POA: Diagnosis not present

## 2011-11-22 DIAGNOSIS — Z794 Long term (current) use of insulin: Secondary | ICD-10-CM | POA: Diagnosis not present

## 2011-11-22 DIAGNOSIS — G609 Hereditary and idiopathic neuropathy, unspecified: Secondary | ICD-10-CM | POA: Diagnosis not present

## 2011-11-22 DIAGNOSIS — D510 Vitamin B12 deficiency anemia due to intrinsic factor deficiency: Secondary | ICD-10-CM | POA: Diagnosis not present

## 2011-11-22 DIAGNOSIS — E119 Type 2 diabetes mellitus without complications: Secondary | ICD-10-CM | POA: Diagnosis not present

## 2011-11-22 DIAGNOSIS — R279 Unspecified lack of coordination: Secondary | ICD-10-CM | POA: Diagnosis not present

## 2011-11-24 DIAGNOSIS — R358 Other polyuria: Secondary | ICD-10-CM | POA: Diagnosis not present

## 2011-11-24 DIAGNOSIS — I10 Essential (primary) hypertension: Secondary | ICD-10-CM | POA: Diagnosis not present

## 2011-11-24 DIAGNOSIS — R3589 Other polyuria: Secondary | ICD-10-CM | POA: Diagnosis not present

## 2011-11-24 DIAGNOSIS — R52 Pain, unspecified: Secondary | ICD-10-CM | POA: Diagnosis not present

## 2011-11-24 DIAGNOSIS — R300 Dysuria: Secondary | ICD-10-CM | POA: Diagnosis not present

## 2011-11-24 DIAGNOSIS — J301 Allergic rhinitis due to pollen: Secondary | ICD-10-CM | POA: Diagnosis not present

## 2011-11-28 DIAGNOSIS — E119 Type 2 diabetes mellitus without complications: Secondary | ICD-10-CM | POA: Diagnosis not present

## 2011-11-28 DIAGNOSIS — Z794 Long term (current) use of insulin: Secondary | ICD-10-CM | POA: Diagnosis not present

## 2011-11-28 DIAGNOSIS — I10 Essential (primary) hypertension: Secondary | ICD-10-CM | POA: Diagnosis not present

## 2011-11-28 DIAGNOSIS — R279 Unspecified lack of coordination: Secondary | ICD-10-CM | POA: Diagnosis not present

## 2011-11-28 DIAGNOSIS — D510 Vitamin B12 deficiency anemia due to intrinsic factor deficiency: Secondary | ICD-10-CM | POA: Diagnosis not present

## 2011-11-28 DIAGNOSIS — G609 Hereditary and idiopathic neuropathy, unspecified: Secondary | ICD-10-CM | POA: Diagnosis not present

## 2011-11-29 DIAGNOSIS — Z794 Long term (current) use of insulin: Secondary | ICD-10-CM | POA: Diagnosis not present

## 2011-11-29 DIAGNOSIS — D510 Vitamin B12 deficiency anemia due to intrinsic factor deficiency: Secondary | ICD-10-CM | POA: Diagnosis not present

## 2011-11-29 DIAGNOSIS — G609 Hereditary and idiopathic neuropathy, unspecified: Secondary | ICD-10-CM | POA: Diagnosis not present

## 2011-11-29 DIAGNOSIS — I10 Essential (primary) hypertension: Secondary | ICD-10-CM | POA: Diagnosis not present

## 2011-11-29 DIAGNOSIS — R279 Unspecified lack of coordination: Secondary | ICD-10-CM | POA: Diagnosis not present

## 2011-11-29 DIAGNOSIS — E119 Type 2 diabetes mellitus without complications: Secondary | ICD-10-CM | POA: Diagnosis not present

## 2011-12-06 DIAGNOSIS — G609 Hereditary and idiopathic neuropathy, unspecified: Secondary | ICD-10-CM | POA: Diagnosis not present

## 2011-12-06 DIAGNOSIS — R279 Unspecified lack of coordination: Secondary | ICD-10-CM | POA: Diagnosis not present

## 2011-12-06 DIAGNOSIS — Z794 Long term (current) use of insulin: Secondary | ICD-10-CM | POA: Diagnosis not present

## 2011-12-06 DIAGNOSIS — E119 Type 2 diabetes mellitus without complications: Secondary | ICD-10-CM | POA: Diagnosis not present

## 2011-12-06 DIAGNOSIS — I10 Essential (primary) hypertension: Secondary | ICD-10-CM | POA: Diagnosis not present

## 2011-12-06 DIAGNOSIS — D510 Vitamin B12 deficiency anemia due to intrinsic factor deficiency: Secondary | ICD-10-CM | POA: Diagnosis not present

## 2011-12-25 DIAGNOSIS — M2550 Pain in unspecified joint: Secondary | ICD-10-CM | POA: Diagnosis not present

## 2011-12-25 DIAGNOSIS — R7309 Other abnormal glucose: Secondary | ICD-10-CM | POA: Diagnosis not present

## 2011-12-25 DIAGNOSIS — R32 Unspecified urinary incontinence: Secondary | ICD-10-CM | POA: Diagnosis not present

## 2011-12-25 DIAGNOSIS — D510 Vitamin B12 deficiency anemia due to intrinsic factor deficiency: Secondary | ICD-10-CM | POA: Diagnosis not present

## 2012-01-09 DIAGNOSIS — I10 Essential (primary) hypertension: Secondary | ICD-10-CM | POA: Diagnosis not present

## 2012-01-09 DIAGNOSIS — M2550 Pain in unspecified joint: Secondary | ICD-10-CM | POA: Diagnosis not present

## 2012-01-29 DIAGNOSIS — M545 Low back pain: Secondary | ICD-10-CM | POA: Diagnosis not present

## 2012-01-29 DIAGNOSIS — D510 Vitamin B12 deficiency anemia due to intrinsic factor deficiency: Secondary | ICD-10-CM | POA: Diagnosis not present

## 2012-01-29 DIAGNOSIS — I10 Essential (primary) hypertension: Secondary | ICD-10-CM | POA: Diagnosis not present

## 2012-02-15 DIAGNOSIS — R609 Edema, unspecified: Secondary | ICD-10-CM | POA: Diagnosis not present

## 2012-03-06 DIAGNOSIS — Z23 Encounter for immunization: Secondary | ICD-10-CM | POA: Diagnosis not present

## 2012-03-06 DIAGNOSIS — D518 Other vitamin B12 deficiency anemias: Secondary | ICD-10-CM | POA: Diagnosis not present

## 2012-03-06 DIAGNOSIS — E119 Type 2 diabetes mellitus without complications: Secondary | ICD-10-CM | POA: Diagnosis not present

## 2012-03-06 DIAGNOSIS — I10 Essential (primary) hypertension: Secondary | ICD-10-CM | POA: Diagnosis not present

## 2012-04-04 DIAGNOSIS — D518 Other vitamin B12 deficiency anemias: Secondary | ICD-10-CM | POA: Diagnosis not present

## 2012-04-04 DIAGNOSIS — E119 Type 2 diabetes mellitus without complications: Secondary | ICD-10-CM | POA: Diagnosis not present

## 2012-04-04 DIAGNOSIS — I10 Essential (primary) hypertension: Secondary | ICD-10-CM | POA: Diagnosis not present

## 2012-05-01 DIAGNOSIS — R5383 Other fatigue: Secondary | ICD-10-CM | POA: Diagnosis not present

## 2012-05-01 DIAGNOSIS — D518 Other vitamin B12 deficiency anemias: Secondary | ICD-10-CM | POA: Diagnosis not present

## 2012-05-01 DIAGNOSIS — E119 Type 2 diabetes mellitus without complications: Secondary | ICD-10-CM | POA: Diagnosis not present

## 2012-05-06 DIAGNOSIS — E042 Nontoxic multinodular goiter: Secondary | ICD-10-CM | POA: Diagnosis not present

## 2012-05-06 DIAGNOSIS — I10 Essential (primary) hypertension: Secondary | ICD-10-CM | POA: Diagnosis not present

## 2012-05-06 DIAGNOSIS — D518 Other vitamin B12 deficiency anemias: Secondary | ICD-10-CM | POA: Diagnosis not present

## 2012-05-22 DIAGNOSIS — R498 Other voice and resonance disorders: Secondary | ICD-10-CM | POA: Diagnosis not present

## 2012-05-27 DIAGNOSIS — E119 Type 2 diabetes mellitus without complications: Secondary | ICD-10-CM | POA: Diagnosis not present

## 2012-05-28 DIAGNOSIS — D485 Neoplasm of uncertain behavior of skin: Secondary | ICD-10-CM | POA: Diagnosis not present

## 2012-05-28 DIAGNOSIS — L578 Other skin changes due to chronic exposure to nonionizing radiation: Secondary | ICD-10-CM | POA: Diagnosis not present

## 2012-05-28 DIAGNOSIS — Z85820 Personal history of malignant melanoma of skin: Secondary | ICD-10-CM | POA: Diagnosis not present

## 2012-05-28 DIAGNOSIS — L820 Inflamed seborrheic keratosis: Secondary | ICD-10-CM | POA: Diagnosis not present

## 2012-05-28 DIAGNOSIS — L570 Actinic keratosis: Secondary | ICD-10-CM | POA: Diagnosis not present

## 2012-05-28 DIAGNOSIS — C44711 Basal cell carcinoma of skin of unspecified lower limb, including hip: Secondary | ICD-10-CM | POA: Diagnosis not present

## 2012-05-28 DIAGNOSIS — Z85828 Personal history of other malignant neoplasm of skin: Secondary | ICD-10-CM | POA: Diagnosis not present

## 2012-06-04 DIAGNOSIS — E042 Nontoxic multinodular goiter: Secondary | ICD-10-CM | POA: Diagnosis not present

## 2012-06-04 DIAGNOSIS — D518 Other vitamin B12 deficiency anemias: Secondary | ICD-10-CM | POA: Diagnosis not present

## 2012-06-04 DIAGNOSIS — R1310 Dysphagia, unspecified: Secondary | ICD-10-CM | POA: Diagnosis not present

## 2012-06-05 DIAGNOSIS — L820 Inflamed seborrheic keratosis: Secondary | ICD-10-CM | POA: Diagnosis not present

## 2012-06-05 DIAGNOSIS — C44711 Basal cell carcinoma of skin of unspecified lower limb, including hip: Secondary | ICD-10-CM | POA: Diagnosis not present

## 2012-07-02 DIAGNOSIS — D518 Other vitamin B12 deficiency anemias: Secondary | ICD-10-CM | POA: Diagnosis not present

## 2012-07-10 DIAGNOSIS — R197 Diarrhea, unspecified: Secondary | ICD-10-CM | POA: Diagnosis not present

## 2012-07-10 DIAGNOSIS — R1310 Dysphagia, unspecified: Secondary | ICD-10-CM | POA: Diagnosis not present

## 2012-07-10 DIAGNOSIS — Z86010 Personal history of colonic polyps: Secondary | ICD-10-CM | POA: Diagnosis not present

## 2012-07-17 DIAGNOSIS — R1310 Dysphagia, unspecified: Secondary | ICD-10-CM | POA: Diagnosis not present

## 2012-07-17 DIAGNOSIS — R197 Diarrhea, unspecified: Secondary | ICD-10-CM | POA: Diagnosis not present

## 2012-07-17 DIAGNOSIS — K2980 Duodenitis without bleeding: Secondary | ICD-10-CM | POA: Diagnosis not present

## 2012-07-17 DIAGNOSIS — D490 Neoplasm of unspecified behavior of digestive system: Secondary | ICD-10-CM | POA: Diagnosis not present

## 2012-08-20 DIAGNOSIS — I6529 Occlusion and stenosis of unspecified carotid artery: Secondary | ICD-10-CM | POA: Diagnosis not present

## 2012-08-22 DIAGNOSIS — I10 Essential (primary) hypertension: Secondary | ICD-10-CM | POA: Diagnosis not present

## 2012-08-22 DIAGNOSIS — E119 Type 2 diabetes mellitus without complications: Secondary | ICD-10-CM | POA: Diagnosis not present

## 2012-08-22 DIAGNOSIS — J019 Acute sinusitis, unspecified: Secondary | ICD-10-CM | POA: Diagnosis not present

## 2012-08-22 DIAGNOSIS — D518 Other vitamin B12 deficiency anemias: Secondary | ICD-10-CM | POA: Diagnosis not present

## 2012-09-10 DIAGNOSIS — I10 Essential (primary) hypertension: Secondary | ICD-10-CM | POA: Diagnosis not present

## 2012-09-10 DIAGNOSIS — E785 Hyperlipidemia, unspecified: Secondary | ICD-10-CM | POA: Diagnosis not present

## 2012-09-17 DIAGNOSIS — D518 Other vitamin B12 deficiency anemias: Secondary | ICD-10-CM | POA: Diagnosis not present

## 2012-09-17 DIAGNOSIS — J019 Acute sinusitis, unspecified: Secondary | ICD-10-CM | POA: Diagnosis not present

## 2012-09-17 DIAGNOSIS — I10 Essential (primary) hypertension: Secondary | ICD-10-CM | POA: Diagnosis not present

## 2012-09-17 DIAGNOSIS — E119 Type 2 diabetes mellitus without complications: Secondary | ICD-10-CM | POA: Diagnosis not present

## 2012-10-16 DIAGNOSIS — D518 Other vitamin B12 deficiency anemias: Secondary | ICD-10-CM | POA: Diagnosis not present

## 2012-10-28 DIAGNOSIS — S82409A Unspecified fracture of shaft of unspecified fibula, initial encounter for closed fracture: Secondary | ICD-10-CM | POA: Diagnosis not present

## 2012-11-12 DIAGNOSIS — S8290XD Unspecified fracture of unspecified lower leg, subsequent encounter for closed fracture with routine healing: Secondary | ICD-10-CM | POA: Diagnosis not present

## 2012-11-18 ENCOUNTER — Encounter: Payer: Self-pay | Admitting: *Deleted

## 2012-11-20 ENCOUNTER — Ambulatory Visit (INDEPENDENT_AMBULATORY_CARE_PROVIDER_SITE_OTHER): Payer: Medicare Other | Admitting: Family Medicine

## 2012-11-20 ENCOUNTER — Encounter: Payer: Self-pay | Admitting: Family Medicine

## 2012-11-20 VITALS — BP 130/68 | HR 80

## 2012-11-20 DIAGNOSIS — E119 Type 2 diabetes mellitus without complications: Secondary | ICD-10-CM | POA: Diagnosis not present

## 2012-11-20 DIAGNOSIS — E785 Hyperlipidemia, unspecified: Secondary | ICD-10-CM

## 2012-11-20 DIAGNOSIS — R109 Unspecified abdominal pain: Secondary | ICD-10-CM | POA: Diagnosis not present

## 2012-11-20 DIAGNOSIS — D510 Vitamin B12 deficiency anemia due to intrinsic factor deficiency: Secondary | ICD-10-CM | POA: Diagnosis not present

## 2012-11-20 DIAGNOSIS — N289 Disorder of kidney and ureter, unspecified: Secondary | ICD-10-CM | POA: Diagnosis not present

## 2012-11-20 DIAGNOSIS — Z0000 Encounter for general adult medical examination without abnormal findings: Secondary | ICD-10-CM

## 2012-11-20 MED ORDER — PRAVASTATIN SODIUM 20 MG PO TABS: 20.0000 mg | ORAL_TABLET | Freq: Every day | ORAL | Status: DC

## 2012-11-20 MED ORDER — HYOSCYAMINE SULFATE 0.125 MG SL SUBL: 0.1250 mg | SUBLINGUAL_TABLET | Freq: Three times a day (TID) | SUBLINGUAL | Status: DC | PRN

## 2012-11-20 MED ORDER — LOSARTAN POTASSIUM 100 MG PO TABS: 100.0000 mg | ORAL_TABLET | Freq: Every day | ORAL | Status: DC

## 2012-11-20 MED ORDER — FLUTICASONE PROPIONATE 50 MCG/ACT NA SUSP: 1.0000 | Freq: Every day | NASAL | Status: DC

## 2012-11-20 MED ORDER — GLIPIZIDE 5 MG PO TABS: 5.0000 mg | ORAL_TABLET | Freq: Two times a day (BID) | ORAL | Status: DC

## 2012-11-20 MED ORDER — CITALOPRAM HYDROBROMIDE 20 MG PO TABS: 20.0000 mg | ORAL_TABLET | Freq: Every day | ORAL | Status: DC

## 2012-11-20 MED ORDER — NABUMETONE 500 MG PO TABS: 500.0000 mg | ORAL_TABLET | Freq: Two times a day (BID) | ORAL | Status: DC | PRN

## 2012-11-20 MED ORDER — TRAMADOL HCL 50 MG PO TABS: 50.0000 mg | ORAL_TABLET | Freq: Three times a day (TID) | ORAL | Status: DC | PRN

## 2012-11-20 MED ORDER — METFORMIN HCL 500 MG PO TABS: 500.0000 mg | ORAL_TABLET | Freq: Two times a day (BID) | ORAL | Status: DC

## 2012-11-20 MED ORDER — MECLIZINE HCL 12.5 MG PO TABS: 12.5000 mg | ORAL_TABLET | ORAL | Status: DC | PRN

## 2012-11-20 MED ORDER — PANTOPRAZOLE SODIUM 20 MG PO TBEC: 20.0000 mg | DELAYED_RELEASE_TABLET | Freq: Every day | ORAL | Status: DC

## 2012-11-20 MED ORDER — METOPROLOL SUCCINATE ER 25 MG PO TB24: 25.0000 mg | ORAL_TABLET | Freq: Every day | ORAL | Status: DC

## 2012-11-20 MED ORDER — CETIRIZINE HCL 10 MG PO TABS: 10.0000 mg | ORAL_TABLET | Freq: Every day | ORAL | Status: DC

## 2012-11-20 MED ORDER — BUSPIRONE HCL 15 MG PO TABS: ORAL_TABLET | ORAL | Status: DC

## 2012-11-20 MED ORDER — CYANOCOBALAMIN 1000 MCG/ML IJ SOLN
1000.0000 ug | Freq: Once | INTRAMUSCULAR | Status: AC
  Administered 2012-11-20: 1000 ug via INTRAMUSCULAR

## 2012-11-20 MED ORDER — CLONIDINE HCL 0.2 MG PO TABS: 0.2000 mg | ORAL_TABLET | Freq: Two times a day (BID) | ORAL | Status: DC

## 2012-11-20 NOTE — Progress Notes (Signed)
  Subjective:    Patient ID: Meghan Duran, female    DOB: May 06, 1924, 79 y.o.   MRN: [id number]  Diabetes She presents for her follow-up diabetic visit. She has type 2 diabetes mellitus. She is compliant with treatment all of the time. Her breakfast blood glucose range is generally 110-130 mg/dl. Her dinner blood glucose is taken between 4-5 pm. Her dinner blood glucose range is generally 110-130 mg/dl. She sees a podiatrist.Eye exam current: November 2013.  Hyperlipidemia-she does try to follow a good diet. Takes a medicine on a regular basis. Had recent lab work. Hypertension-takes a medicine on a regular basis denies any problem no headaches. Needs refills. Abdominal bloating intermittently she has a feeling of bloating it's not severe pain no bloody stools denies vomiting diarrhea sweats or chills with it. Has fracture of the right ankle she is followed by orthopedics difficulty getting around with her boot She will be going into a nursing home/rest home [facility] she will followup here every 3-4 months. She has a history of pernicious anemia need some monthly B12 shots  Family history noncontributory please see sheet   Dr. Charlett Blake June 2 wering moonboot for 2 more weeks Review of Systems She denies chest tightness pressure pain shortness of breath no swelling in the legs. Some joint pains and discomfort please see above.    Objective:   Physical Exam Blood pressure good, lungs are clear no crackles heart is regular pulse normal abdomen is soft extremities has moonboot on the right foot left side is normal       Assessment & Plan:  DM-Overall she's doing good with this follow diet closely. Take her medications. Will see back in the proximal Lane 3-4 months. Recheck A1c. hyperlip-Continue diet continue medication. Monitor blood work twice yearly HTN-Overall doing good continue current medications. Watch diet closely abd pain-Bloating. Try Levsin SL when necessary see if this doesn't  help no need for any extensive testing currently.

## 2012-12-03 DIAGNOSIS — S8290XD Unspecified fracture of unspecified lower leg, subsequent encounter for closed fracture with routine healing: Secondary | ICD-10-CM | POA: Diagnosis not present

## 2012-12-10 DIAGNOSIS — M19079 Primary osteoarthritis, unspecified ankle and foot: Secondary | ICD-10-CM | POA: Diagnosis not present

## 2012-12-13 ENCOUNTER — Other Ambulatory Visit (INDEPENDENT_AMBULATORY_CARE_PROVIDER_SITE_OTHER): Payer: Medicare Other | Admitting: *Deleted

## 2012-12-13 DIAGNOSIS — D509 Iron deficiency anemia, unspecified: Secondary | ICD-10-CM | POA: Diagnosis not present

## 2012-12-13 DIAGNOSIS — N390 Urinary tract infection, site not specified: Secondary | ICD-10-CM | POA: Diagnosis not present

## 2012-12-13 DIAGNOSIS — Z0000 Encounter for general adult medical examination without abnormal findings: Secondary | ICD-10-CM | POA: Diagnosis not present

## 2012-12-13 DIAGNOSIS — D510 Vitamin B12 deficiency anemia due to intrinsic factor deficiency: Secondary | ICD-10-CM | POA: Diagnosis not present

## 2012-12-13 LAB — POC HEMOCCULT BLD/STL (HOME/3-CARD/SCREEN)
Card #2 Fecal Occult Blod, POC: POSITIVE
Card #3 Fecal Occult Blood, POC: POSITIVE

## 2012-12-23 NOTE — Progress Notes (Signed)
[facility] with Lupita Leash her daughter 12/23/12

## 2012-12-24 ENCOUNTER — Other Ambulatory Visit: Payer: Self-pay | Admitting: Family Medicine

## 2012-12-24 ENCOUNTER — Ambulatory Visit (INDEPENDENT_AMBULATORY_CARE_PROVIDER_SITE_OTHER): Payer: Medicare Other | Admitting: *Deleted

## 2012-12-24 DIAGNOSIS — D510 Vitamin B12 deficiency anemia due to intrinsic factor deficiency: Secondary | ICD-10-CM

## 2012-12-24 DIAGNOSIS — Z0000 Encounter for general adult medical examination without abnormal findings: Secondary | ICD-10-CM | POA: Diagnosis not present

## 2012-12-24 DIAGNOSIS — D509 Iron deficiency anemia, unspecified: Secondary | ICD-10-CM | POA: Diagnosis not present

## 2012-12-24 DIAGNOSIS — R195 Other fecal abnormalities: Secondary | ICD-10-CM

## 2012-12-24 DIAGNOSIS — N390 Urinary tract infection, site not specified: Secondary | ICD-10-CM | POA: Diagnosis not present

## 2012-12-24 MED ORDER — CYANOCOBALAMIN 1000 MCG/ML IJ SOLN
1000.0000 ug | Freq: Once | INTRAMUSCULAR | Status: AC
  Administered 2012-12-24: 1000 ug via INTRAMUSCULAR

## 2012-12-25 ENCOUNTER — Ambulatory Visit (INDEPENDENT_AMBULATORY_CARE_PROVIDER_SITE_OTHER): Payer: Medicare Other | Admitting: Family Medicine

## 2012-12-25 ENCOUNTER — Encounter: Payer: Self-pay | Admitting: Family Medicine

## 2012-12-25 VITALS — BP 162/78 | Temp 98.0°F | Ht 65.5 in | Wt 197.1 lb

## 2012-12-25 DIAGNOSIS — N390 Urinary tract infection, site not specified: Secondary | ICD-10-CM

## 2012-12-25 DIAGNOSIS — D510 Vitamin B12 deficiency anemia due to intrinsic factor deficiency: Secondary | ICD-10-CM | POA: Diagnosis not present

## 2012-12-25 DIAGNOSIS — Z0000 Encounter for general adult medical examination without abnormal findings: Secondary | ICD-10-CM | POA: Diagnosis not present

## 2012-12-25 DIAGNOSIS — D509 Iron deficiency anemia, unspecified: Secondary | ICD-10-CM | POA: Diagnosis not present

## 2012-12-25 LAB — POCT URINALYSIS DIPSTICK

## 2012-12-25 MED ORDER — NITROFURANTOIN MONOHYD MACRO 100 MG PO CAPS: 100.0000 mg | ORAL_CAPSULE | Freq: Two times a day (BID) | ORAL | Status: AC

## 2012-12-25 NOTE — Progress Notes (Signed)
  Subjective:    Patient ID: Meghan Duran, female    DOB: 1924/05/11, 79 y.o.   MRN: [id number]  Urinary Tract Infection  This is a new problem. The current episode started 1 to 4 weeks ago. The problem occurs every urination. The problem has been unchanged. The quality of the pain is described as aching. The pain is mild. There has been no fever. Associated symptoms include frequency. Associated symptoms comments: Back pain. She has tried nothing for the symptoms. The treatment provided no relief.   Recent Hemoccult cards positive denies abdominal pain sweats chills vomiting.   Review of Systems  Genitourinary: Positive for frequency.   patient recently had 3 Hemoccult cards which were positive she states this is been ongoing problems she relates that her intestinal Dr. in [place] has done an endoscopy in 2 years ago did a colonoscopy and she was told no further testing necessary and she would just watch it. She denies heavy bleeding she does state she has hemorrhoids. She denies sweats chills. She relates she is 79 years old and does not want further tests     Objective:   Physical Exam  Lungs are clear no respiratory distress heart regular pulse normal skin warm dry extremities no edema Urinalysis with wbc's      Assessment & Plan:  UTI-Macrobid twice a day for 7 days Hemoccult positive cards-patient does not want further testing in terms a colonoscopy or GI referral. Lab tests were ordered if they come back dramatically iron deficient she might be willing to see gastroenterology. She hopes to see her doctor in [place]. This would be later this fall or December.

## 2012-12-27 ENCOUNTER — Encounter: Payer: Self-pay | Admitting: *Deleted

## 2012-12-27 ENCOUNTER — Telehealth: Payer: Self-pay | Admitting: Family Medicine

## 2012-12-27 ENCOUNTER — Other Ambulatory Visit: Payer: Self-pay | Admitting: *Deleted

## 2012-12-27 MED ORDER — CIPROFLOXACIN HCL 250 MG PO TABS: 250.0000 mg | ORAL_TABLET | Freq: Two times a day (BID) | ORAL | Status: AC

## 2012-12-27 NOTE — Telephone Encounter (Signed)
pts daughter called stating she is having an issue with her Macrobid that was prescribed this week, e.g. Diarrhea and slightly swollen lips. Please advise

## 2012-12-27 NOTE — Telephone Encounter (Signed)
Stop macrobid. Document allergy. cipro 250 bid for five days

## 2012-12-27 NOTE — Telephone Encounter (Signed)
cipro 250 one bid for 5 days sent to [facility] to delivery to [place]. D/c macrobid. Allergy documented in chart. Pt's daughter notified on her voicemail.

## 2013-01-08 ENCOUNTER — Telehealth: Payer: Self-pay | Admitting: Family Medicine

## 2013-01-08 MED ORDER — SULFAMETHOXAZOLE-TMP DS 800-160 MG PO TABS: 1.0000 | ORAL_TABLET | Freq: Two times a day (BID) | ORAL | Status: DC

## 2013-01-08 NOTE — Telephone Encounter (Signed)
Meghan Duran states Meghan Duran still has symptoms of UTI.  Has uncontrollable Urine, Painful Urination, etc.  Completed all antibiotics.  Can we please call in a new round of medication to [facility] in [place]  Please call Daughter for question.  Thanks

## 2013-01-08 NOTE — Telephone Encounter (Signed)
Use Bactrim DS one bid for 7 days, plz call in, fu if ongoing

## 2013-01-08 NOTE — Telephone Encounter (Signed)
Med sent electronically to [facility]. Patient notified.

## 2013-01-14 ENCOUNTER — Ambulatory Visit: Payer: Medicare Other | Admitting: Family Medicine

## 2013-01-21 ENCOUNTER — Ambulatory Visit (INDEPENDENT_AMBULATORY_CARE_PROVIDER_SITE_OTHER): Payer: Medicare Other

## 2013-01-21 DIAGNOSIS — D649 Anemia, unspecified: Secondary | ICD-10-CM | POA: Diagnosis not present

## 2013-01-21 MED ORDER — CYANOCOBALAMIN 1000 MCG/ML IJ SOLN
1000.0000 ug | Freq: Once | INTRAMUSCULAR | Status: AC
  Administered 2013-01-21: 1000 ug via INTRAMUSCULAR

## 2013-02-06 ENCOUNTER — Telehealth: Payer: Self-pay | Admitting: *Deleted

## 2013-02-06 NOTE — Telephone Encounter (Signed)
Form on chart dropped off by daughter for diabetic shoes. Does she need appt first or can you fill out form. Last seen 11/20/12 for diabetic check up.

## 2013-02-07 NOTE — Telephone Encounter (Signed)
Left message with gentleman to have Meghan Duran return our call.

## 2013-02-07 NOTE — Telephone Encounter (Signed)
Needs face to face for [place] to specifically address if she desire she can do this on her next visit or set up a visit to do so

## 2013-02-11 NOTE — Telephone Encounter (Signed)
daughter informed and scheduled office visit.

## 2013-02-17 ENCOUNTER — Other Ambulatory Visit: Payer: Self-pay | Admitting: Family Medicine

## 2013-02-17 DIAGNOSIS — D509 Iron deficiency anemia, unspecified: Secondary | ICD-10-CM | POA: Diagnosis not present

## 2013-02-17 DIAGNOSIS — N390 Urinary tract infection, site not specified: Secondary | ICD-10-CM | POA: Diagnosis not present

## 2013-02-17 LAB — CBC WITH DIFFERENTIAL/PLATELET
Basophils Relative: 1 % (ref 0–1)
Hemoglobin: 11.8 g/dL — ABNORMAL LOW (ref 12.0–15.0)
MCHC: 31.9 g/dL (ref 30.0–36.0)
Monocytes Relative: 9 % (ref 3–12)
Neutro Abs: 3 10*3/uL (ref 1.7–7.7)
Neutrophils Relative %: 55 % (ref 43–77)
RBC: 4.03 MIL/uL (ref 3.87–5.11)
WBC: 5.5 10*3/uL (ref 4.0–10.5)

## 2013-02-17 LAB — IRON AND TIBC
%SAT: 31 % (ref 20–55)
TIBC: 315 ug/dL (ref 250–470)

## 2013-02-18 LAB — FERRITIN: Ferritin: 29 ng/mL (ref 10–291)

## 2013-02-19 ENCOUNTER — Ambulatory Visit (INDEPENDENT_AMBULATORY_CARE_PROVIDER_SITE_OTHER): Payer: Medicare Other | Admitting: Family Medicine

## 2013-02-19 ENCOUNTER — Ambulatory Visit: Payer: Medicare Other

## 2013-02-19 ENCOUNTER — Encounter: Payer: Self-pay | Admitting: Family Medicine

## 2013-02-19 VITALS — BP 132/72 | Ht 65.0 in | Wt 203.8 lb

## 2013-02-19 DIAGNOSIS — R300 Dysuria: Secondary | ICD-10-CM | POA: Diagnosis not present

## 2013-02-19 DIAGNOSIS — E119 Type 2 diabetes mellitus without complications: Secondary | ICD-10-CM | POA: Diagnosis not present

## 2013-02-19 DIAGNOSIS — D510 Vitamin B12 deficiency anemia due to intrinsic factor deficiency: Secondary | ICD-10-CM

## 2013-02-19 DIAGNOSIS — R195 Other fecal abnormalities: Secondary | ICD-10-CM | POA: Insufficient documentation

## 2013-02-19 DIAGNOSIS — N289 Disorder of kidney and ureter, unspecified: Secondary | ICD-10-CM

## 2013-02-19 DIAGNOSIS — E1142 Type 2 diabetes mellitus with diabetic polyneuropathy: Secondary | ICD-10-CM

## 2013-02-19 DIAGNOSIS — E1149 Type 2 diabetes mellitus with other diabetic neurological complication: Secondary | ICD-10-CM

## 2013-02-19 DIAGNOSIS — E1140 Type 2 diabetes mellitus with diabetic neuropathy, unspecified: Secondary | ICD-10-CM | POA: Insufficient documentation

## 2013-02-19 LAB — POCT URINALYSIS DIPSTICK: pH, UA: 5

## 2013-02-19 MED ORDER — CYANOCOBALAMIN 1000 MCG/ML IJ SOLN
1000.0000 ug | Freq: Once | INTRAMUSCULAR | Status: AC
  Administered 2013-02-19: 1000 ug via INTRAMUSCULAR

## 2013-02-19 NOTE — Progress Notes (Signed)
  Subjective:    Patient ID: Meghan Duran, female    DOB: 1923/09/13, 79 y.o.   MRN: [id number]  HPI Patient is here for an medication check up, prescription for diabetic shoes, and B12 shot She does have pernicious anemia she needs her B12 shot She relates she has been doing good job watching her diet and taking her medications for diabetes denies excessive thirst urination or blurred vision She does relate bilateral foot pain some burning moderate numbness in the feet cannot feel her feet. She has ataxia issues has difficult time walking without a cane or a walker. May well benefit from some physical therapy for gait training. Patient has concerns about foot and leg pain, sore spot on top of head that feels like an knot, frequent urination, and wants to discuss getting physical therapy  She does stay at a nursing home currently. She possibly will be going back to [place] in December. Does not smoke. Review of Systems See above. Denies chest tightness pressure pain shortness breath.   [facility] Objective:   Physical Exam Lungs are clear no crackles heart is regular. She does have some tenderness on the left superior scalp. There are no lesions seen. No blisters. Extremities trace edema in the ankles foot exam noted on the diabetic foot exam evaluation. Patient does have difficulty in standing she has ataxia I believe she would benefit from physical therapy.  Proper dietary measures and importance of medications discussed       Assessment & Plan:  Phys Th-referral to [facility] in [facility] for ataxia gait training.  Heme-patient relates she has not seen any blood in her stools her Hemoccult cards were positive x3 she does not want a colonoscopy but she agrees to trying to stop anti-inflammatory for the next few weeks to recheck Hemoccult cards then followup in a few weeks. Quite possibly if those are negative we don't need to do a colonoscopy if positive we  will ReoPro to the issue again Fu 1 month  Watch for shingles-and discomfort on top her head could be early sign of shingles family was warned what to watch for they'll let us now  Diabetes controlled good followup 3 months on this  Pernicious anemia B12 today to again in one month  Bilateral neuropathy in the feet along with foot pain and discomfort and bunion on the left foot I believe this patient would benefit from diabetic shoes.  No evidence of UTI on urinary exam

## 2013-02-19 NOTE — Patient Instructions (Signed)
Stop Relafen for now (nabumetone)  Do the stool cards   Then recheck 1 month   (playing Let's Make a Deal )

## 2013-02-20 ENCOUNTER — Ambulatory Visit: Payer: Medicare Other | Admitting: Family Medicine

## 2013-02-25 ENCOUNTER — Telehealth: Payer: Self-pay | Admitting: Family Medicine

## 2013-02-25 NOTE — Telephone Encounter (Signed)
Patients daughter is calling to see if the referral for PT at [facility] has been done. I cannot see anything in the chart.

## 2013-02-25 NOTE — Telephone Encounter (Signed)
Left message on voicemail notifying Meghan Duran that order was faxed to [facility] for PT and that the receptionist will be contacting her to schedule appt.

## 2013-02-26 ENCOUNTER — Telehealth: Payer: Self-pay | Admitting: Family Medicine

## 2013-02-26 NOTE — Telephone Encounter (Signed)
Was completed and faxed

## 2013-02-26 NOTE — Telephone Encounter (Signed)
Form needs to be completed for Diabetic Shoes.  Please send information to [facility].

## 2013-03-05 ENCOUNTER — Other Ambulatory Visit (INDEPENDENT_AMBULATORY_CARE_PROVIDER_SITE_OTHER): Payer: Medicare Other | Admitting: *Deleted

## 2013-03-05 DIAGNOSIS — Z0000 Encounter for general adult medical examination without abnormal findings: Secondary | ICD-10-CM

## 2013-03-05 DIAGNOSIS — I69993 Ataxia following unspecified cerebrovascular disease: Secondary | ICD-10-CM | POA: Diagnosis not present

## 2013-03-05 DIAGNOSIS — R262 Difficulty in walking, not elsewhere classified: Secondary | ICD-10-CM | POA: Diagnosis not present

## 2013-03-05 DIAGNOSIS — M6281 Muscle weakness (generalized): Secondary | ICD-10-CM | POA: Diagnosis not present

## 2013-03-12 DIAGNOSIS — M6281 Muscle weakness (generalized): Secondary | ICD-10-CM | POA: Diagnosis not present

## 2013-03-12 DIAGNOSIS — R262 Difficulty in walking, not elsewhere classified: Secondary | ICD-10-CM | POA: Diagnosis not present

## 2013-03-12 DIAGNOSIS — I69993 Ataxia following unspecified cerebrovascular disease: Secondary | ICD-10-CM | POA: Diagnosis not present

## 2013-03-25 ENCOUNTER — Encounter: Payer: Self-pay | Admitting: Family Medicine

## 2013-03-25 ENCOUNTER — Ambulatory Visit (INDEPENDENT_AMBULATORY_CARE_PROVIDER_SITE_OTHER): Payer: Medicare Other | Admitting: Family Medicine

## 2013-03-25 VITALS — BP 128/60 | Ht 65.0 in | Wt 203.8 lb

## 2013-03-25 DIAGNOSIS — R195 Other fecal abnormalities: Secondary | ICD-10-CM

## 2013-03-25 DIAGNOSIS — Z23 Encounter for immunization: Secondary | ICD-10-CM | POA: Diagnosis not present

## 2013-03-25 DIAGNOSIS — D510 Vitamin B12 deficiency anemia due to intrinsic factor deficiency: Secondary | ICD-10-CM | POA: Diagnosis not present

## 2013-03-25 DIAGNOSIS — R109 Unspecified abdominal pain: Secondary | ICD-10-CM

## 2013-03-25 MED ORDER — CITALOPRAM HYDROBROMIDE 40 MG PO TABS: 40.0000 mg | ORAL_TABLET | Freq: Every day | ORAL | Status: DC

## 2013-03-25 MED ORDER — CYANOCOBALAMIN 1000 MCG/ML IJ SOLN
1000.0000 ug | Freq: Once | INTRAMUSCULAR | Status: AC
  Administered 2013-03-25: 1000 ug via INTRAMUSCULAR

## 2013-03-25 MED ORDER — PANTOPRAZOLE SODIUM 40 MG PO TBEC: 40.0000 mg | DELAYED_RELEASE_TABLET | Freq: Every day | ORAL | Status: DC

## 2013-03-25 NOTE — Progress Notes (Signed)
  Subjective:    Patient ID: Meghan Duran, female    DOB: Nov 28, 1923, 79 y.o.   MRN: [id number]  HPI Patient is here today b/c she is having pain in her left leg.  Patient also complains of some abdominal pains also some hip pains states at times she's not feeling good she is eating well. She denies any rectal bleeding. She also says she is feeling depressed.  Patient with intermittent spells of feeling depressed not suicidal. In a stressful situation living in a rest home where there is a lot of conflict going on.  PMH patient with diabetic neuropathy renal insufficiency diabetes depression issues Family history noncontributory ROS negative for chest pains tightness shortness of breath. Denies rectal bleeding denies diarrhea Review of Systems See above    Objective:   Physical Exam  Lungs are clear hearts regular abdomen is soft no guarding or rebound her weight is stable extremities no edema      Assessment & Plan:  #1 mild depression increase Celexa from 20 mg to 40 mg followup in one month #2 pernicious anemia B12 shot given #3 patient with history of anemia hemoglobin today looks good her weight is stable I do not feel that this patient needs any type of CAT scan or colonoscopy current #4 leg pain nonspecific followup accordingly no testing necessary currently #5 abdominal pain nonspecific increase acid blocker from 20 mg to 40 mg if rectal bleeding vomiting or other problems followup immediately

## 2013-04-07 DIAGNOSIS — C44529 Squamous cell carcinoma of skin of other part of trunk: Secondary | ICD-10-CM | POA: Diagnosis not present

## 2013-04-07 DIAGNOSIS — L723 Sebaceous cyst: Secondary | ICD-10-CM | POA: Diagnosis not present

## 2013-04-07 DIAGNOSIS — L821 Other seborrheic keratosis: Secondary | ICD-10-CM | POA: Diagnosis not present

## 2013-04-07 DIAGNOSIS — D485 Neoplasm of uncertain behavior of skin: Secondary | ICD-10-CM | POA: Diagnosis not present

## 2013-04-07 DIAGNOSIS — L570 Actinic keratosis: Secondary | ICD-10-CM | POA: Diagnosis not present

## 2013-04-22 ENCOUNTER — Encounter: Payer: Self-pay | Admitting: Family Medicine

## 2013-04-22 ENCOUNTER — Ambulatory Visit (INDEPENDENT_AMBULATORY_CARE_PROVIDER_SITE_OTHER): Payer: Medicare Other | Admitting: Family Medicine

## 2013-04-22 VITALS — BP 132/80 | Ht 65.0 in | Wt 201.0 lb

## 2013-04-22 DIAGNOSIS — Z79899 Other long term (current) drug therapy: Secondary | ICD-10-CM | POA: Diagnosis not present

## 2013-04-22 DIAGNOSIS — R531 Weakness: Secondary | ICD-10-CM

## 2013-04-22 DIAGNOSIS — D649 Anemia, unspecified: Secondary | ICD-10-CM | POA: Diagnosis not present

## 2013-04-22 DIAGNOSIS — E119 Type 2 diabetes mellitus without complications: Secondary | ICD-10-CM

## 2013-04-22 DIAGNOSIS — N289 Disorder of kidney and ureter, unspecified: Secondary | ICD-10-CM

## 2013-04-22 DIAGNOSIS — R5381 Other malaise: Secondary | ICD-10-CM

## 2013-04-22 DIAGNOSIS — E782 Mixed hyperlipidemia: Secondary | ICD-10-CM

## 2013-04-22 MED ORDER — CYANOCOBALAMIN 1000 MCG/ML IJ SOLN
1000.0000 ug | Freq: Once | INTRAMUSCULAR | Status: AC
  Administered 2013-04-22: 1000 ug via INTRAMUSCULAR

## 2013-04-22 NOTE — Patient Instructions (Addendum)
If needing Ibuprofen then hold off on Relafen ( Nubematone)  Tylenol 500 mg, 1 to 2 tablet as needed very 6 hours as needed , no greater than 6 in a day is safe

## 2013-04-22 NOTE — Progress Notes (Addendum)
  Subjective:    Patient ID: Meghan Duran, female    DOB: [birth year], 79 y.o.   MRN: [id number]  HPIHere for a check up. A1C today 4.4. Patient denies low blood sugars. She denies any complications she does try to eat healthy. Concerns about fibromyalgia pain and nerve pain. She described having several days worse she was having significant pain and discomfort in the trapezius muscles as well as the left arm and also the right leg and even in the left leg she attributes this to fibromyalgia as well as possible pinched nerve. She states your last for several days then it gradually gets better then it'll come back again. She typically will take either ibuprofen or Tylenol for her. She has a history of diabetes renal insufficiency, pernicious anemia, hyperlipidemia. Patient had Hemoccult cards earlier this year which were negative. Family history noncontributory Social stays at her rest home This patient has been essentially having a very difficult time ambulating because of the pain in her legs and weakness. She also has a difficult time getting out of the rest home and trying to get in a car is very difficult. PMH see above Family history noncontributory Social doesn't smoke  B12 shot given today.  Review of Systems See above denies chest tightness pressure pain shortness breath vomiting diarrhea rectal bleeding    Objective:   Physical Exam Face to face evaluation was done for the patient in order to help approved for physical therapy  Patient looks to feel okay today lungs are clear no crackles heart is regular. Extremities no edema skin warm dry pulses are normal in the arms Patient has difficult time standing and difficult time walking because of weakness and pain in her legs  It should be noted when the patient came in that a diabetic foot exam was completed. She does have dense neuropathy in her feet. She also has some mild deformities with bunions. She also tends to have a flat foot.  Diminished pulses are also noted in the feet. No ulcers or infections are noted. Patient would benefit from diabetic shoes with inserts.      Assessment & Plan:  Diabetes - Plan: POCT glycosylated hemoglobin (Hb A1C), Lipid panel, Microalbumin, urine  Anemia - Plan: cyanocobalamin ((VITAMIN B-12)) injection 1,000 mcg  Weakness - Plan: Ambulatory referral to Home Health  Renal insufficiency - Plan: Basic metabolic panel  Encounter for long-term (current) use of other medications - Plan: Hepatic function panel  Mixed hyperlipidemia  Patient was cautioned to avoid using too much anti-inflammatories. We will consult physical therapy through home health to see patient will qualify for hopefully evaluation and treat. More than likely to physical therapy per week for the next 3-4 weeks to help with strengthening and treatment of ataxia.

## 2013-04-30 ENCOUNTER — Other Ambulatory Visit: Payer: Self-pay | Admitting: Family Medicine

## 2013-04-30 DIAGNOSIS — C44529 Squamous cell carcinoma of skin of other part of trunk: Secondary | ICD-10-CM | POA: Diagnosis not present

## 2013-04-30 DIAGNOSIS — D045 Carcinoma in situ of skin of trunk: Secondary | ICD-10-CM | POA: Diagnosis not present

## 2013-04-30 NOTE — Telephone Encounter (Signed)
May refill all of these x4

## 2013-05-14 ENCOUNTER — Telehealth: Payer: Self-pay | Admitting: Family Medicine

## 2013-05-14 NOTE — Telephone Encounter (Signed)
Pt needs sent to [facility] the notes from the office appt that states she needs diabetic shoes

## 2013-05-15 DIAGNOSIS — Z79899 Other long term (current) drug therapy: Secondary | ICD-10-CM | POA: Diagnosis not present

## 2013-05-15 DIAGNOSIS — N289 Disorder of kidney and ureter, unspecified: Secondary | ICD-10-CM | POA: Diagnosis not present

## 2013-05-15 DIAGNOSIS — E119 Type 2 diabetes mellitus without complications: Secondary | ICD-10-CM | POA: Diagnosis not present

## 2013-05-15 LAB — BASIC METABOLIC PANEL
BUN: 18 mg/dL (ref 6–23)
CO2: 26 mEq/L (ref 19–32)
Chloride: 103 mEq/L (ref 96–112)
Glucose, Bld: 164 mg/dL — ABNORMAL HIGH (ref 70–99)
Potassium: 4.8 mEq/L (ref 3.5–5.3)

## 2013-05-15 LAB — LIPID PANEL
Cholesterol: 240 mg/dL — ABNORMAL HIGH (ref 0–200)
LDL Cholesterol: 163 mg/dL — ABNORMAL HIGH (ref 0–99)
Total CHOL/HDL Ratio: 5.1 Ratio
VLDL: 30 mg/dL (ref 0–40)

## 2013-05-15 LAB — HEPATIC FUNCTION PANEL
Bilirubin, Direct: 0.1 mg/dL (ref 0.0–0.3)
Indirect Bilirubin: 0.5 mg/dL (ref 0.0–0.9)
Total Bilirubin: 0.6 mg/dL (ref 0.3–1.2)

## 2013-05-21 ENCOUNTER — Other Ambulatory Visit: Payer: Self-pay | Admitting: Family Medicine

## 2013-05-27 ENCOUNTER — Other Ambulatory Visit: Payer: Self-pay | Admitting: Family Medicine

## 2013-05-27 ENCOUNTER — Ambulatory Visit (INDEPENDENT_AMBULATORY_CARE_PROVIDER_SITE_OTHER): Payer: Medicare Other | Admitting: Family Medicine

## 2013-05-27 ENCOUNTER — Encounter: Payer: Self-pay | Admitting: Family Medicine

## 2013-05-27 VITALS — BP 130/68 | Ht 65.0 in | Wt 201.0 lb

## 2013-05-27 DIAGNOSIS — E538 Deficiency of other specified B group vitamins: Secondary | ICD-10-CM

## 2013-05-27 DIAGNOSIS — N289 Disorder of kidney and ureter, unspecified: Secondary | ICD-10-CM

## 2013-05-27 DIAGNOSIS — E785 Hyperlipidemia, unspecified: Secondary | ICD-10-CM

## 2013-05-27 DIAGNOSIS — D510 Vitamin B12 deficiency anemia due to intrinsic factor deficiency: Secondary | ICD-10-CM

## 2013-05-27 DIAGNOSIS — E119 Type 2 diabetes mellitus without complications: Secondary | ICD-10-CM

## 2013-05-27 MED ORDER — GLIPIZIDE 5 MG PO TABS: 2.5000 mg | ORAL_TABLET | Freq: Two times a day (BID) | ORAL | Status: DC

## 2013-05-27 MED ORDER — CYANOCOBALAMIN 1000 MCG/ML IJ SOLN
1000.0000 ug | Freq: Once | INTRAMUSCULAR | Status: AC
  Administered 2013-05-27: 1000 ug via INTRAMUSCULAR

## 2013-05-27 MED ORDER — PRAVASTATIN SODIUM 40 MG PO TABS: 40.0000 mg | ORAL_TABLET | Freq: Every evening | ORAL | Status: DC

## 2013-05-27 NOTE — Progress Notes (Signed)
  Subjective:    Patient ID: Meghan Duran, female    DOB: 12-Apr-1924, 79 y.o.   MRN: [id number]  HPI Patient is here today for a follow up visit on her diabetes. A1C was done on 04/22/13 and it was 4.4. Patient also needs B12 injection done today. Patient states her only concern is fatigue. She has been experiencing fatigue for about 3 months now. Also notes difficulty with her acid reflux and diverticulitis. Patient has also completed her routine blood work.  Sleeping a lot Feels fatiqued Trying to eat properly, no low spells with sugar Bowels moving well pmh reviewed [facility] reviewed Had B 12 today  Review of Systems  Constitutional: Negative for fever, activity change and appetite change.  HENT: Negative for congestion and ear pain.   Respiratory: Negative for apnea and cough.   Gastrointestinal: Negative for abdominal pain.       Objective:   Physical Exam  Constitutional: She appears well-developed and well-nourished.  HENT:  Head: Normocephalic.  Eyes: Pupils are equal, round, and reactive to light.  Neck: No thyromegaly present.  Cardiovascular: Normal rate, regular rhythm and normal heart sounds.   No murmur heard. Pulmonary/Chest: Effort normal and breath sounds normal. No respiratory distress. She has no wheezes.  Abdominal: Soft. She exhibits no distension and no mass. There is no tenderness.  Musculoskeletal: She exhibits edema (trace in the ankles). She exhibits no tenderness.  Lymphadenopathy:    She has no cervical adenopathy.  Neurological: She is alert. She exhibits normal muscle tone.  Skin: Skin is warm and dry.  Psychiatric: She has a normal mood and affect. Her behavior is normal.          Assessment & Plan:  #1 hyperlipidemia-subpar control increase pravastatin to 40 mg daily. Recheck lipid liver profile in 3-4 months. If any problems report back. #2 pernicious anemia B12 shot given today continue this #3 diabetes under very good control patient  denies low sugar spells. Patient states that she tries the healthy. I would recommend that her diabetic medicine be reduced down to a half a tablet glipizide twice daily. We will notify the patient of this changed. #4 trace pedal edema-no need to change medications.

## 2013-06-05 DIAGNOSIS — I70209 Unspecified atherosclerosis of native arteries of extremities, unspecified extremity: Secondary | ICD-10-CM | POA: Diagnosis not present

## 2013-06-05 DIAGNOSIS — M2040 Other hammer toe(s) (acquired), unspecified foot: Secondary | ICD-10-CM | POA: Diagnosis not present

## 2013-06-05 DIAGNOSIS — B351 Tinea unguium: Secondary | ICD-10-CM | POA: Diagnosis not present

## 2013-06-05 DIAGNOSIS — E1159 Type 2 diabetes mellitus with other circulatory complications: Secondary | ICD-10-CM | POA: Diagnosis not present

## 2013-06-23 ENCOUNTER — Other Ambulatory Visit: Payer: Self-pay | Admitting: Family Medicine

## 2013-06-23 DIAGNOSIS — M159 Polyosteoarthritis, unspecified: Secondary | ICD-10-CM | POA: Diagnosis not present

## 2013-06-23 DIAGNOSIS — D518 Other vitamin B12 deficiency anemias: Secondary | ICD-10-CM | POA: Diagnosis not present

## 2013-07-15 DIAGNOSIS — H04129 Dry eye syndrome of unspecified lacrimal gland: Secondary | ICD-10-CM | POA: Diagnosis not present

## 2013-07-15 DIAGNOSIS — E119 Type 2 diabetes mellitus without complications: Secondary | ICD-10-CM | POA: Diagnosis not present

## 2013-07-17 ENCOUNTER — Other Ambulatory Visit: Payer: Self-pay | Admitting: Family Medicine

## 2013-07-22 DIAGNOSIS — I951 Orthostatic hypotension: Secondary | ICD-10-CM | POA: Diagnosis not present

## 2013-07-22 DIAGNOSIS — R5381 Other malaise: Secondary | ICD-10-CM | POA: Diagnosis not present

## 2013-07-22 DIAGNOSIS — D518 Other vitamin B12 deficiency anemias: Secondary | ICD-10-CM | POA: Diagnosis not present

## 2013-07-22 DIAGNOSIS — R5383 Other fatigue: Secondary | ICD-10-CM | POA: Diagnosis not present

## 2013-07-22 DIAGNOSIS — E785 Hyperlipidemia, unspecified: Secondary | ICD-10-CM | POA: Diagnosis not present

## 2013-07-22 DIAGNOSIS — I10 Essential (primary) hypertension: Secondary | ICD-10-CM | POA: Diagnosis not present

## 2013-07-24 DIAGNOSIS — D518 Other vitamin B12 deficiency anemias: Secondary | ICD-10-CM | POA: Diagnosis not present

## 2013-07-24 DIAGNOSIS — E119 Type 2 diabetes mellitus without complications: Secondary | ICD-10-CM | POA: Diagnosis not present

## 2013-07-24 DIAGNOSIS — I10 Essential (primary) hypertension: Secondary | ICD-10-CM | POA: Diagnosis not present

## 2013-07-24 DIAGNOSIS — E785 Hyperlipidemia, unspecified: Secondary | ICD-10-CM | POA: Diagnosis not present

## 2013-07-25 DIAGNOSIS — R42 Dizziness and giddiness: Secondary | ICD-10-CM | POA: Diagnosis not present

## 2013-07-25 DIAGNOSIS — J019 Acute sinusitis, unspecified: Secondary | ICD-10-CM | POA: Diagnosis not present

## 2013-07-25 DIAGNOSIS — G609 Hereditary and idiopathic neuropathy, unspecified: Secondary | ICD-10-CM | POA: Diagnosis not present

## 2013-07-25 DIAGNOSIS — Z9181 History of falling: Secondary | ICD-10-CM | POA: Diagnosis not present

## 2013-07-25 DIAGNOSIS — I10 Essential (primary) hypertension: Secondary | ICD-10-CM | POA: Diagnosis not present

## 2013-07-25 DIAGNOSIS — E119 Type 2 diabetes mellitus without complications: Secondary | ICD-10-CM | POA: Diagnosis not present

## 2013-07-28 DIAGNOSIS — G609 Hereditary and idiopathic neuropathy, unspecified: Secondary | ICD-10-CM | POA: Diagnosis not present

## 2013-07-28 DIAGNOSIS — J019 Acute sinusitis, unspecified: Secondary | ICD-10-CM | POA: Diagnosis not present

## 2013-07-28 DIAGNOSIS — Z9181 History of falling: Secondary | ICD-10-CM | POA: Diagnosis not present

## 2013-07-28 DIAGNOSIS — E119 Type 2 diabetes mellitus without complications: Secondary | ICD-10-CM | POA: Diagnosis not present

## 2013-07-28 DIAGNOSIS — I10 Essential (primary) hypertension: Secondary | ICD-10-CM | POA: Diagnosis not present

## 2013-07-28 DIAGNOSIS — R42 Dizziness and giddiness: Secondary | ICD-10-CM | POA: Diagnosis not present

## 2013-07-29 DIAGNOSIS — G609 Hereditary and idiopathic neuropathy, unspecified: Secondary | ICD-10-CM | POA: Diagnosis not present

## 2013-07-29 DIAGNOSIS — R42 Dizziness and giddiness: Secondary | ICD-10-CM | POA: Diagnosis not present

## 2013-07-29 DIAGNOSIS — J019 Acute sinusitis, unspecified: Secondary | ICD-10-CM | POA: Diagnosis not present

## 2013-07-29 DIAGNOSIS — Z9181 History of falling: Secondary | ICD-10-CM | POA: Diagnosis not present

## 2013-07-29 DIAGNOSIS — I10 Essential (primary) hypertension: Secondary | ICD-10-CM | POA: Diagnosis not present

## 2013-07-29 DIAGNOSIS — E119 Type 2 diabetes mellitus without complications: Secondary | ICD-10-CM | POA: Diagnosis not present

## 2013-07-30 DIAGNOSIS — G609 Hereditary and idiopathic neuropathy, unspecified: Secondary | ICD-10-CM | POA: Diagnosis not present

## 2013-07-30 DIAGNOSIS — R42 Dizziness and giddiness: Secondary | ICD-10-CM | POA: Diagnosis not present

## 2013-07-30 DIAGNOSIS — Z9181 History of falling: Secondary | ICD-10-CM | POA: Diagnosis not present

## 2013-07-30 DIAGNOSIS — I10 Essential (primary) hypertension: Secondary | ICD-10-CM | POA: Diagnosis not present

## 2013-07-30 DIAGNOSIS — E119 Type 2 diabetes mellitus without complications: Secondary | ICD-10-CM | POA: Diagnosis not present

## 2013-07-30 DIAGNOSIS — J019 Acute sinusitis, unspecified: Secondary | ICD-10-CM | POA: Diagnosis not present

## 2013-08-01 DIAGNOSIS — G609 Hereditary and idiopathic neuropathy, unspecified: Secondary | ICD-10-CM | POA: Diagnosis not present

## 2013-08-01 DIAGNOSIS — J019 Acute sinusitis, unspecified: Secondary | ICD-10-CM | POA: Diagnosis not present

## 2013-08-01 DIAGNOSIS — E119 Type 2 diabetes mellitus without complications: Secondary | ICD-10-CM | POA: Diagnosis not present

## 2013-08-01 DIAGNOSIS — Z9181 History of falling: Secondary | ICD-10-CM | POA: Diagnosis not present

## 2013-08-01 DIAGNOSIS — R42 Dizziness and giddiness: Secondary | ICD-10-CM | POA: Diagnosis not present

## 2013-08-01 DIAGNOSIS — I10 Essential (primary) hypertension: Secondary | ICD-10-CM | POA: Diagnosis not present

## 2013-08-04 DIAGNOSIS — J019 Acute sinusitis, unspecified: Secondary | ICD-10-CM | POA: Diagnosis not present

## 2013-08-04 DIAGNOSIS — Z9181 History of falling: Secondary | ICD-10-CM | POA: Diagnosis not present

## 2013-08-04 DIAGNOSIS — E119 Type 2 diabetes mellitus without complications: Secondary | ICD-10-CM | POA: Diagnosis not present

## 2013-08-04 DIAGNOSIS — I10 Essential (primary) hypertension: Secondary | ICD-10-CM | POA: Diagnosis not present

## 2013-08-04 DIAGNOSIS — R42 Dizziness and giddiness: Secondary | ICD-10-CM | POA: Diagnosis not present

## 2013-08-04 DIAGNOSIS — G609 Hereditary and idiopathic neuropathy, unspecified: Secondary | ICD-10-CM | POA: Diagnosis not present

## 2013-08-06 DIAGNOSIS — J019 Acute sinusitis, unspecified: Secondary | ICD-10-CM | POA: Diagnosis not present

## 2013-08-06 DIAGNOSIS — Z9181 History of falling: Secondary | ICD-10-CM | POA: Diagnosis not present

## 2013-08-06 DIAGNOSIS — I10 Essential (primary) hypertension: Secondary | ICD-10-CM | POA: Diagnosis not present

## 2013-08-06 DIAGNOSIS — G609 Hereditary and idiopathic neuropathy, unspecified: Secondary | ICD-10-CM | POA: Diagnosis not present

## 2013-08-06 DIAGNOSIS — E119 Type 2 diabetes mellitus without complications: Secondary | ICD-10-CM | POA: Diagnosis not present

## 2013-08-06 DIAGNOSIS — R42 Dizziness and giddiness: Secondary | ICD-10-CM | POA: Diagnosis not present

## 2013-08-07 DIAGNOSIS — I70209 Unspecified atherosclerosis of native arteries of extremities, unspecified extremity: Secondary | ICD-10-CM | POA: Diagnosis not present

## 2013-08-07 DIAGNOSIS — B351 Tinea unguium: Secondary | ICD-10-CM | POA: Diagnosis not present

## 2013-08-07 DIAGNOSIS — E1159 Type 2 diabetes mellitus with other circulatory complications: Secondary | ICD-10-CM | POA: Diagnosis not present

## 2013-08-08 DIAGNOSIS — I10 Essential (primary) hypertension: Secondary | ICD-10-CM | POA: Diagnosis not present

## 2013-08-08 DIAGNOSIS — R42 Dizziness and giddiness: Secondary | ICD-10-CM | POA: Diagnosis not present

## 2013-08-08 DIAGNOSIS — J019 Acute sinusitis, unspecified: Secondary | ICD-10-CM | POA: Diagnosis not present

## 2013-08-08 DIAGNOSIS — G609 Hereditary and idiopathic neuropathy, unspecified: Secondary | ICD-10-CM | POA: Diagnosis not present

## 2013-08-08 DIAGNOSIS — E119 Type 2 diabetes mellitus without complications: Secondary | ICD-10-CM | POA: Diagnosis not present

## 2013-08-08 DIAGNOSIS — Z9181 History of falling: Secondary | ICD-10-CM | POA: Diagnosis not present

## 2013-08-11 DIAGNOSIS — G609 Hereditary and idiopathic neuropathy, unspecified: Secondary | ICD-10-CM | POA: Diagnosis not present

## 2013-08-11 DIAGNOSIS — Z9181 History of falling: Secondary | ICD-10-CM | POA: Diagnosis not present

## 2013-08-11 DIAGNOSIS — J019 Acute sinusitis, unspecified: Secondary | ICD-10-CM | POA: Diagnosis not present

## 2013-08-11 DIAGNOSIS — I10 Essential (primary) hypertension: Secondary | ICD-10-CM | POA: Diagnosis not present

## 2013-08-11 DIAGNOSIS — E119 Type 2 diabetes mellitus without complications: Secondary | ICD-10-CM | POA: Diagnosis not present

## 2013-08-11 DIAGNOSIS — R42 Dizziness and giddiness: Secondary | ICD-10-CM | POA: Diagnosis not present

## 2013-08-14 DIAGNOSIS — R1310 Dysphagia, unspecified: Secondary | ICD-10-CM | POA: Diagnosis not present

## 2013-08-18 DIAGNOSIS — Z9181 History of falling: Secondary | ICD-10-CM | POA: Diagnosis not present

## 2013-08-18 DIAGNOSIS — E119 Type 2 diabetes mellitus without complications: Secondary | ICD-10-CM | POA: Diagnosis not present

## 2013-08-18 DIAGNOSIS — J019 Acute sinusitis, unspecified: Secondary | ICD-10-CM | POA: Diagnosis not present

## 2013-08-18 DIAGNOSIS — R42 Dizziness and giddiness: Secondary | ICD-10-CM | POA: Diagnosis not present

## 2013-08-18 DIAGNOSIS — G609 Hereditary and idiopathic neuropathy, unspecified: Secondary | ICD-10-CM | POA: Diagnosis not present

## 2013-08-18 DIAGNOSIS — I10 Essential (primary) hypertension: Secondary | ICD-10-CM | POA: Diagnosis not present

## 2013-08-19 DIAGNOSIS — J019 Acute sinusitis, unspecified: Secondary | ICD-10-CM | POA: Diagnosis not present

## 2013-08-19 DIAGNOSIS — Z9181 History of falling: Secondary | ICD-10-CM | POA: Diagnosis not present

## 2013-08-19 DIAGNOSIS — E119 Type 2 diabetes mellitus without complications: Secondary | ICD-10-CM | POA: Diagnosis not present

## 2013-08-19 DIAGNOSIS — I10 Essential (primary) hypertension: Secondary | ICD-10-CM | POA: Diagnosis not present

## 2013-08-19 DIAGNOSIS — G609 Hereditary and idiopathic neuropathy, unspecified: Secondary | ICD-10-CM | POA: Diagnosis not present

## 2013-08-19 DIAGNOSIS — R42 Dizziness and giddiness: Secondary | ICD-10-CM | POA: Diagnosis not present

## 2013-08-23 DIAGNOSIS — R42 Dizziness and giddiness: Secondary | ICD-10-CM | POA: Diagnosis not present

## 2013-08-23 DIAGNOSIS — I10 Essential (primary) hypertension: Secondary | ICD-10-CM | POA: Diagnosis not present

## 2013-08-23 DIAGNOSIS — J019 Acute sinusitis, unspecified: Secondary | ICD-10-CM | POA: Diagnosis not present

## 2013-08-23 DIAGNOSIS — G609 Hereditary and idiopathic neuropathy, unspecified: Secondary | ICD-10-CM | POA: Diagnosis not present

## 2013-08-23 DIAGNOSIS — E119 Type 2 diabetes mellitus without complications: Secondary | ICD-10-CM | POA: Diagnosis not present

## 2013-08-23 DIAGNOSIS — Z9181 History of falling: Secondary | ICD-10-CM | POA: Diagnosis not present

## 2013-08-25 DIAGNOSIS — D518 Other vitamin B12 deficiency anemias: Secondary | ICD-10-CM | POA: Diagnosis not present

## 2013-08-25 DIAGNOSIS — R1084 Generalized abdominal pain: Secondary | ICD-10-CM | POA: Diagnosis not present

## 2013-08-26 DIAGNOSIS — Z9181 History of falling: Secondary | ICD-10-CM | POA: Diagnosis not present

## 2013-08-26 DIAGNOSIS — R42 Dizziness and giddiness: Secondary | ICD-10-CM | POA: Diagnosis not present

## 2013-08-26 DIAGNOSIS — I10 Essential (primary) hypertension: Secondary | ICD-10-CM | POA: Diagnosis not present

## 2013-08-26 DIAGNOSIS — E119 Type 2 diabetes mellitus without complications: Secondary | ICD-10-CM | POA: Diagnosis not present

## 2013-08-26 DIAGNOSIS — J019 Acute sinusitis, unspecified: Secondary | ICD-10-CM | POA: Diagnosis not present

## 2013-08-26 DIAGNOSIS — G609 Hereditary and idiopathic neuropathy, unspecified: Secondary | ICD-10-CM | POA: Diagnosis not present

## 2013-08-27 DIAGNOSIS — I6529 Occlusion and stenosis of unspecified carotid artery: Secondary | ICD-10-CM | POA: Diagnosis not present

## 2013-08-27 DIAGNOSIS — I10 Essential (primary) hypertension: Secondary | ICD-10-CM | POA: Diagnosis not present

## 2013-08-28 ENCOUNTER — Other Ambulatory Visit: Payer: Self-pay | Admitting: Family Medicine

## 2013-08-28 DIAGNOSIS — R42 Dizziness and giddiness: Secondary | ICD-10-CM | POA: Diagnosis not present

## 2013-08-28 DIAGNOSIS — J019 Acute sinusitis, unspecified: Secondary | ICD-10-CM | POA: Diagnosis not present

## 2013-08-28 DIAGNOSIS — E119 Type 2 diabetes mellitus without complications: Secondary | ICD-10-CM | POA: Diagnosis not present

## 2013-08-28 DIAGNOSIS — G609 Hereditary and idiopathic neuropathy, unspecified: Secondary | ICD-10-CM | POA: Diagnosis not present

## 2013-08-28 DIAGNOSIS — Z9181 History of falling: Secondary | ICD-10-CM | POA: Diagnosis not present

## 2013-08-28 DIAGNOSIS — I10 Essential (primary) hypertension: Secondary | ICD-10-CM | POA: Diagnosis not present

## 2013-09-01 DIAGNOSIS — I10 Essential (primary) hypertension: Secondary | ICD-10-CM | POA: Diagnosis not present

## 2013-09-01 DIAGNOSIS — R42 Dizziness and giddiness: Secondary | ICD-10-CM | POA: Diagnosis not present

## 2013-09-01 DIAGNOSIS — J019 Acute sinusitis, unspecified: Secondary | ICD-10-CM | POA: Diagnosis not present

## 2013-09-01 DIAGNOSIS — Z9181 History of falling: Secondary | ICD-10-CM | POA: Diagnosis not present

## 2013-09-01 DIAGNOSIS — G609 Hereditary and idiopathic neuropathy, unspecified: Secondary | ICD-10-CM | POA: Diagnosis not present

## 2013-09-01 DIAGNOSIS — E119 Type 2 diabetes mellitus without complications: Secondary | ICD-10-CM | POA: Diagnosis not present

## 2013-09-03 DIAGNOSIS — I10 Essential (primary) hypertension: Secondary | ICD-10-CM | POA: Diagnosis not present

## 2013-09-03 DIAGNOSIS — J019 Acute sinusitis, unspecified: Secondary | ICD-10-CM | POA: Diagnosis not present

## 2013-09-03 DIAGNOSIS — G609 Hereditary and idiopathic neuropathy, unspecified: Secondary | ICD-10-CM | POA: Diagnosis not present

## 2013-09-03 DIAGNOSIS — Z9181 History of falling: Secondary | ICD-10-CM | POA: Diagnosis not present

## 2013-09-03 DIAGNOSIS — R42 Dizziness and giddiness: Secondary | ICD-10-CM | POA: Diagnosis not present

## 2013-09-03 DIAGNOSIS — E119 Type 2 diabetes mellitus without complications: Secondary | ICD-10-CM | POA: Diagnosis not present

## 2013-09-05 DIAGNOSIS — I10 Essential (primary) hypertension: Secondary | ICD-10-CM | POA: Diagnosis not present

## 2013-09-05 DIAGNOSIS — E119 Type 2 diabetes mellitus without complications: Secondary | ICD-10-CM | POA: Diagnosis not present

## 2013-09-05 DIAGNOSIS — Z9181 History of falling: Secondary | ICD-10-CM | POA: Diagnosis not present

## 2013-09-05 DIAGNOSIS — J019 Acute sinusitis, unspecified: Secondary | ICD-10-CM | POA: Diagnosis not present

## 2013-09-05 DIAGNOSIS — G609 Hereditary and idiopathic neuropathy, unspecified: Secondary | ICD-10-CM | POA: Diagnosis not present

## 2013-09-05 DIAGNOSIS — R42 Dizziness and giddiness: Secondary | ICD-10-CM | POA: Diagnosis not present

## 2013-09-08 DIAGNOSIS — I10 Essential (primary) hypertension: Secondary | ICD-10-CM | POA: Diagnosis not present

## 2013-09-08 DIAGNOSIS — R42 Dizziness and giddiness: Secondary | ICD-10-CM | POA: Diagnosis not present

## 2013-09-08 DIAGNOSIS — E119 Type 2 diabetes mellitus without complications: Secondary | ICD-10-CM | POA: Diagnosis not present

## 2013-09-08 DIAGNOSIS — G609 Hereditary and idiopathic neuropathy, unspecified: Secondary | ICD-10-CM | POA: Diagnosis not present

## 2013-09-08 DIAGNOSIS — J019 Acute sinusitis, unspecified: Secondary | ICD-10-CM | POA: Diagnosis not present

## 2013-09-08 DIAGNOSIS — Z9181 History of falling: Secondary | ICD-10-CM | POA: Diagnosis not present

## 2013-09-15 DIAGNOSIS — D518 Other vitamin B12 deficiency anemias: Secondary | ICD-10-CM | POA: Diagnosis not present

## 2013-09-17 DIAGNOSIS — E119 Type 2 diabetes mellitus without complications: Secondary | ICD-10-CM | POA: Diagnosis not present

## 2013-09-17 DIAGNOSIS — Z9181 History of falling: Secondary | ICD-10-CM | POA: Diagnosis not present

## 2013-09-17 DIAGNOSIS — I10 Essential (primary) hypertension: Secondary | ICD-10-CM | POA: Diagnosis not present

## 2013-09-17 DIAGNOSIS — G609 Hereditary and idiopathic neuropathy, unspecified: Secondary | ICD-10-CM | POA: Diagnosis not present

## 2013-09-17 DIAGNOSIS — J019 Acute sinusitis, unspecified: Secondary | ICD-10-CM | POA: Diagnosis not present

## 2013-09-17 DIAGNOSIS — R42 Dizziness and giddiness: Secondary | ICD-10-CM | POA: Diagnosis not present

## 2013-09-21 ENCOUNTER — Observation Stay (HOSPITAL_COMMUNITY)
Admission: EM | Admit: 2013-09-21 | Discharge: 2013-09-23 | Disposition: A | Discharge status: Home / Self Care | Payer: Medicare Other | Attending: Family Medicine | Admitting: Family Medicine

## 2013-09-21 ENCOUNTER — Emergency Department (HOSPITAL_COMMUNITY): Payer: Medicare Other

## 2013-09-21 ENCOUNTER — Encounter (HOSPITAL_COMMUNITY): Payer: Self-pay | Admitting: Emergency Medicine

## 2013-09-21 DIAGNOSIS — F329 Major depressive disorder, single episode, unspecified: Secondary | ICD-10-CM | POA: Diagnosis not present

## 2013-09-21 DIAGNOSIS — E860 Dehydration: Secondary | ICD-10-CM

## 2013-09-21 DIAGNOSIS — I10 Essential (primary) hypertension: Secondary | ICD-10-CM

## 2013-09-21 DIAGNOSIS — F3289 Other specified depressive episodes: Secondary | ICD-10-CM | POA: Insufficient documentation

## 2013-09-21 DIAGNOSIS — F32A Depression, unspecified: Secondary | ICD-10-CM | POA: Diagnosis present

## 2013-09-21 DIAGNOSIS — N179 Acute kidney failure, unspecified: Secondary | ICD-10-CM | POA: Diagnosis not present

## 2013-09-21 DIAGNOSIS — E785 Hyperlipidemia, unspecified: Secondary | ICD-10-CM

## 2013-09-21 DIAGNOSIS — F411 Generalized anxiety disorder: Secondary | ICD-10-CM | POA: Diagnosis not present

## 2013-09-21 DIAGNOSIS — N289 Disorder of kidney and ureter, unspecified: Secondary | ICD-10-CM | POA: Diagnosis not present

## 2013-09-21 DIAGNOSIS — E1140 Type 2 diabetes mellitus with diabetic neuropathy, unspecified: Secondary | ICD-10-CM

## 2013-09-21 DIAGNOSIS — E119 Type 2 diabetes mellitus without complications: Secondary | ICD-10-CM

## 2013-09-21 DIAGNOSIS — F419 Anxiety disorder, unspecified: Secondary | ICD-10-CM

## 2013-09-21 DIAGNOSIS — N390 Urinary tract infection, site not specified: Secondary | ICD-10-CM

## 2013-09-21 DIAGNOSIS — E1142 Type 2 diabetes mellitus with diabetic polyneuropathy: Secondary | ICD-10-CM | POA: Insufficient documentation

## 2013-09-21 DIAGNOSIS — K219 Gastro-esophageal reflux disease without esophagitis: Secondary | ICD-10-CM

## 2013-09-21 DIAGNOSIS — R109 Unspecified abdominal pain: Secondary | ICD-10-CM

## 2013-09-21 DIAGNOSIS — E041 Nontoxic single thyroid nodule: Secondary | ICD-10-CM

## 2013-09-21 DIAGNOSIS — R531 Weakness: Secondary | ICD-10-CM | POA: Diagnosis present

## 2013-09-21 DIAGNOSIS — G459 Transient cerebral ischemic attack, unspecified: Principal | ICD-10-CM

## 2013-09-21 DIAGNOSIS — Z87891 Personal history of nicotine dependence: Secondary | ICD-10-CM | POA: Diagnosis not present

## 2013-09-21 DIAGNOSIS — E669 Obesity, unspecified: Secondary | ICD-10-CM | POA: Diagnosis not present

## 2013-09-21 DIAGNOSIS — R29818 Other symptoms and signs involving the nervous system: Secondary | ICD-10-CM | POA: Diagnosis not present

## 2013-09-21 DIAGNOSIS — D510 Vitamin B12 deficiency anemia due to intrinsic factor deficiency: Secondary | ICD-10-CM | POA: Diagnosis not present

## 2013-09-21 DIAGNOSIS — R195 Other fecal abnormalities: Secondary | ICD-10-CM

## 2013-09-21 DIAGNOSIS — E1149 Type 2 diabetes mellitus with other diabetic neurological complication: Secondary | ICD-10-CM | POA: Diagnosis not present

## 2013-09-21 HISTORY — DX: Gastro-esophageal reflux disease without esophagitis: K21.9

## 2013-09-21 HISTORY — DX: Essential (primary) hypertension: I10

## 2013-09-21 HISTORY — DX: Major depressive disorder, single episode, unspecified: F32.9

## 2013-09-21 HISTORY — DX: Type 2 diabetes mellitus without complications: E11.9

## 2013-09-21 HISTORY — DX: Nontoxic single thyroid nodule: E04.1

## 2013-09-21 HISTORY — DX: Anxiety disorder, unspecified: F41.9

## 2013-09-21 LAB — DIFFERENTIAL
Basophils Absolute: 0.1 10*3/uL (ref 0.0–0.1)
Basophils Relative: 1 % (ref 0–1)
Eosinophils Absolute: 0.4 10*3/uL (ref 0.0–0.7)
Eosinophils Relative: 5 % (ref 0–5)
LYMPHS ABS: 2.4 10*3/uL (ref 0.7–4.0)
LYMPHS PCT: 32 % (ref 12–46)
MONO ABS: 0.9 10*3/uL (ref 0.1–1.0)
Monocytes Relative: 12 % (ref 3–12)
NEUTROS ABS: 3.9 10*3/uL (ref 1.7–7.7)
Neutrophils Relative %: 51 % (ref 43–77)

## 2013-09-21 LAB — CBC
HEMATOCRIT: 37.1 % (ref 36.0–46.0)
HEMOGLOBIN: 12.2 g/dL (ref 12.0–15.0)
MCH: 31.2 pg (ref 26.0–34.0)
MCHC: 32.9 g/dL (ref 30.0–36.0)
MCV: 94.9 fL (ref 78.0–100.0)
Platelets: 232 10*3/uL (ref 150–400)
RBC: 3.91 MIL/uL (ref 3.87–5.11)
RDW: 13 % (ref 11.5–15.5)
WBC: 7.7 10*3/uL (ref 4.0–10.5)

## 2013-09-21 LAB — I-STAT CHEM 8, ED
BUN: 28 mg/dL — AB (ref 6–23)
CALCIUM ION: 1.17 mmol/L (ref 1.13–1.30)
CHLORIDE: 105 meq/L (ref 96–112)
CREATININE: 1.5 mg/dL — AB (ref 0.50–1.10)
Glucose, Bld: 85 mg/dL (ref 70–99)
HCT: 38 % (ref 36.0–46.0)
Hemoglobin: 12.9 g/dL (ref 12.0–15.0)
Potassium: 3.9 mEq/L (ref 3.7–5.3)
Sodium: 142 mEq/L (ref 137–147)
TCO2: 23 mmol/L (ref 0–100)

## 2013-09-21 LAB — APTT: aPTT: 27 seconds (ref 24–37)

## 2013-09-21 LAB — PROTIME-INR
INR: 0.88 (ref 0.00–1.49)
Prothrombin Time: 11.8 seconds (ref 11.6–15.2)

## 2013-09-21 NOTE — ED Provider Notes (Signed)
CSN: [id number]     Arrival date & time 09/21/13  2252 History  This chart was scribed for Sharyon Cable, MD by Zettie Pho, ED Scribe. This patient was seen in room APA05/APA05 and the patient's care was started at 11:04 PM.    Chief Complaint  Patient presents with  . Stroke Symptoms   Patient is a 80 y.o. female presenting with extremity weakness. The history is provided by the patient. No language interpreter was used.  Extremity Weakness This is a new problem. The current episode started less than 1 hour ago. The problem occurs constantly. The problem has been gradually improving. Pertinent negatives include no headaches and no shortness of breath. Nothing aggravates the symptoms. Nothing relieves the symptoms. She has tried nothing for the symptoms.   HPI Comments: Meghan Duran is a 80 y.o. female who presents to the Emergency Department complaining of stroke-like symptoms including weakness to the right arm and difficulty speaking onset about 30 minutes ago that she states has been gradually improving. Patient reports that she uses a walker for normal, daily ambulation, but that she tripped and fell earlier today, causing her to have some persistent, unsteady gait. Patient denies loss of consciousness. Her daughter reports that the patient has had intermittent episodes of diaphoresis. She denies headache, cough, shortness of breath. She denies history of stroke. Patient has a history of HTN, cancer, pernicious anemia, and neuropathy.   Past Medical History  Diagnosis Date  . Hypertension   . Cancer   . Pernicious anemia   . Neuropathy     Feet and legs   No past surgical history on file. Family History  Problem Relation Age of Onset  . Diabetes Sister   . Heart attack Sister    History  Substance Use Topics  . Smoking status: Former Smoker    Quit date: 11/20/1985  . Smokeless tobacco: Not on file     Comment: Over 30 years ago  . Alcohol Use: No   OB History    Grav Para Term Preterm Abortions TAB SAB Ect Mult Living                 Review of Systems  Respiratory: Negative for cough and shortness of breath.   Musculoskeletal: Positive for extremity weakness and gait problem.  Neurological: Positive for speech difficulty and weakness. Negative for headaches.  All other systems reviewed and are negative.   Allergies  Amoxil; Cymbalta; Macrobid; and Tramadol  Home Medications   Current Outpatient Rx  Name  Route  Sig  Dispense  Refill  . busPIRone (BUSPAR) 15 MG tablet      TAKE ONE TABLET BY MOUTH TWICE DAILY   60 tablet   4   . cetirizine (ZYRTEC) 10 MG tablet   Oral   Take 1 tablet (10 mg total) by mouth daily.   30 tablet   5   . citalopram (CELEXA) 40 MG tablet      TAKE ONE TABLET BY MOUTH ONCE DAILY   30 tablet   5   . cloNIDine (CATAPRES) 0.2 MG tablet      TAKE ONE TABLET BY MOUTH TWICE DAILY   60 tablet   5   . fluticasone (FLONASE) 50 MCG/ACT nasal spray   Nasal   Place 1 spray into the nose daily.   16 g   5   . glipiZIDE (GLUCOTROL) 5 MG tablet   Oral   Take 0.5 tablets (2.5 mg total) by  mouth 2 (two) times daily before a meal.   30 tablet   5     New dose, reduced due to A1C @4 .4   . hyoscyamine (LEVSIN SL) 0.125 MG SL tablet   Sublingual   Place 1 tablet (0.125 mg total) under the tongue 3 (three) times daily as needed for cramping.   60 tablet   5   . losartan (COZAAR) 100 MG tablet      TAKE ONE TABLET BY MOUTH ONCE DAILY   30 tablet   5   . losartan (COZAAR) 100 MG tablet      TAKE ONE TABLET BY MOUTH ONCE DAILY   30 tablet   5   . meclizine (ANTIVERT) 12.5 MG tablet      TAKE ONE TABLET BY MOUTH TWICE DAILY AS NEEDED   60 tablet   4   . metFORMIN (GLUCOPHAGE) 500 MG tablet      TAKE ONE TABLET BY MOUTH TWICE DAILY WITH MEALS   60 tablet   5   . metoprolol succinate (TOPROL-XL) 25 MG 24 hr tablet      TAKE ONE TABLET BY MOUTH ONCE DAILY   30 tablet   4   . nabumetone  (RELAFEN) 500 MG tablet      TAKE ONE TABLET BY MOUTH TWICE DAILY AS NEEDED FOR PAIN   60 tablet   4   . pantoprazole (PROTONIX) 40 MG tablet   Oral   Take 1 tablet (40 mg total) by mouth daily.   30 tablet   5     May need to update her bubble packs   . pravastatin (PRAVACHOL) 40 MG tablet   Oral   Take 1 tablet (40 mg total) by mouth every evening.   30 tablet   11     New dose   . QC ALL DAY ALLERGY 10 MG tablet      TAKE ONE TABLET BY MOUTH DAILY   30 tablet   4   . TRUETRACK TEST test strip      TEST ONCE A DAY   100 each   4    Triage Vitals: BP 142/68  Pulse 82  Temp(Src) 97.7 F (36.5 C) (Oral)  Resp 16  Ht 5' 5.5" (1.664 m)  Wt 194 lb (87.998 kg)  BMI 31.78 kg/m2  SpO2 100%  Physical Exam  Nursing note and vitals reviewed.  CONSTITUTIONAL: Well developed/well nourished HEAD: Normocephalic/atraumatic EYES: EOMI/PERRL, no nystagmus, no visual field deficit  ENMT: Mucous membranes moist NECK: supple no meningeal signs, no bruits Spine - no CTL tenderness, No bruising/crepitance/stepoffs noted to spine CV: S1/S2 noted, no murmurs/rubs/gallops noted LUNGS: Lungs are clear to auscultation bilaterally, no apparent distress ABDOMEN: soft, nontender, no rebound or guarding GU:no cva tenderness NEURO:Awake/alert, facies symmetric, no arm or leg drift is noted Equal strength with shoulder abduction, elbow flex/extension, wrist flex/extension in upper extremities and equal hand grips bilaterally Equal 5/5 strength with hip flexion,knee flex/extension, foot dorsi/plantar flexion Cranial nerves 3/4/5/6/01/08/09/11/12 tested and intact Sensation to light touch intact in all extremities EXTREMITIES: pulses normal, full ROM, no signs of trauma to upper or lower extremities SKIN: warm, color normal PSYCH: no abnormalities of mood noted  ED Course  Procedures   DIAGNOSTIC STUDIES: Oxygen Saturation is 100% on room air, normal by my interpretation.     COORDINATION OF CARE: 11:07 PM- Will order blood labs (ethanol, I-stat chem 8, protime-INR, APTT, CBC, CMP, troponin), UA, EKG, and a head CT.  Discussed that patient may need to be admitted. Discussed treatment plan with patient at bedside and patient verbalized agreement.   tPA in stroke considered but not given due to: Symptoms resolved  12:27 AM- Patient reports that she still currently feels a little weak with associated paresthesias to the right forearm and hand. Upon recheck, patient still has normal strength and sensation throughout. Discussed that CT results were normal and that symptoms are likely due to a TIA. Discussed that the patient will need to be admitted to the hospital for further evaluation/treatment. Discussed that UA also indicates a UTI, but patient denies any associated symptoms. Will start patient on cipro to treat. Discussed treatment plan with patient at bedside and patient verbalized agreement.    1:11 AM D/w dr Grandville Silos, [facility] Will admit for TIA workup as well as UTI   Labs Review Labs Reviewed  COMPREHENSIVE METABOLIC PANEL - Abnormal; Notable for the following:    BUN 31 (*)    Creatinine, Ser 1.43 (*)    GFR calc non Af Amer 32 (*)    GFR calc Af Amer 37 (*)    All other components within normal limits  URINE RAPID DRUG SCREEN (HOSP PERFORMED) - Abnormal; Notable for the following:    Amphetamines POSITIVE (*)    All other components within normal limits  URINALYSIS, ROUTINE W REFLEX MICROSCOPIC - Abnormal; Notable for the following:    Specific Gravity, Urine >1.030 (*)    Hgb urine dipstick TRACE (*)    Bilirubin Urine SMALL (*)    Leukocytes, UA SMALL (*)    All other components within normal limits  URINE MICROSCOPIC-ADD ON - Abnormal; Notable for the following:    Squamous Epithelial / LPF FEW (*)    Bacteria, UA MANY (*)    All other components within normal limits  I-STAT CHEM 8, ED - Abnormal; Notable for the following:    BUN 28 (*)     Creatinine, Ser 1.50 (*)    All other components within normal limits  ETHANOL  PROTIME-INR  APTT  CBC  DIFFERENTIAL  TROPONIN I   Imaging Review Ct Head Wo Contrast  09/22/2013   CLINICAL DATA:  Right arm weakness.  Slurred speech.  EXAM: CT HEAD WITHOUT CONTRAST  TECHNIQUE: Contiguous axial images were obtained from the base of the skull through the vertex without intravenous contrast.  COMPARISON:  Head CT 03/06/2010.  FINDINGS: Mild cerebral and cerebellar atrophy. Patchy and confluent areas of decreased attenuation are noted throughout the deep and periventricular white matter of the cerebral hemispheres bilaterally, compatible with chronic microvascular ischemic disease. No acute intracranial abnormalities. Specifically, no evidence of acute intracranial hemorrhage, no definite findings of acute/subacute cerebral ischemia, no mass, mass effect, hydrocephalus or abnormal intra or extra-axial fluid collections. Visualized paranasal sinuses and mastoids are well pneumatized, with exception of opacification of the right frontal sinus. No acute displaced skull fractures are identified.  IMPRESSION: 1. No acute intracranial abnormalities. 2. Mild cerebral and cerebellar atrophy with chronic microvascular ischemic changes in the cerebral white matter. 3. Opacification of the right frontal sinus. Although this may be chronic, clinical correlation for signs and symptoms of acute sinusitis is recommended.   Electronically Signed   By: Vinnie Langton M.D.   On: 09/22/2013 00:20     EKG Interpretation   Date/Time:  Sunday September 21 2013 23:20:18 EDT Ventricular Rate:  81 PR Interval:  178 QRS Duration: 76 QT Interval:  410 QTC Calculation: 476 R Axis:  50 Text Interpretation:  Normal sinus rhythm Normal ECG When compared with  ECG of 06-Mar-2010 11:26, QT has lengthened Confirmed by Christy Gentles  MD,  Zandon Talton ([id number]) on 09/21/2013 11:36:10 PM      MDM   Final diagnoses:  TIA (transient  ischemic attack)  Dehydration  UTI (lower urinary tract infection)    Nursing notes including past medical history and social history reviewed and considered in documentation Labs/vital reviewed and considered   I personally performed the services described in this documentation, which was scribed in my presence. The recorded information has been reviewed and is accurate.       Sharyon Cable, MD 09/22/13 0111

## 2013-09-21 NOTE — ED Notes (Signed)
Patient states she went to get up and was unable to move right arm and had slurred speech at approximately 2215 tonight. States symptoms have resolved prior to arrival to ED.

## 2013-09-22 ENCOUNTER — Observation Stay (HOSPITAL_COMMUNITY): Payer: Medicare Other

## 2013-09-22 ENCOUNTER — Encounter (HOSPITAL_COMMUNITY): Payer: Self-pay | Admitting: Internal Medicine

## 2013-09-22 DIAGNOSIS — R259 Unspecified abnormal involuntary movements: Secondary | ICD-10-CM | POA: Diagnosis not present

## 2013-09-22 DIAGNOSIS — N179 Acute kidney failure, unspecified: Secondary | ICD-10-CM

## 2013-09-22 DIAGNOSIS — K219 Gastro-esophageal reflux disease without esophagitis: Secondary | ICD-10-CM

## 2013-09-22 DIAGNOSIS — E041 Nontoxic single thyroid nodule: Secondary | ICD-10-CM | POA: Diagnosis present

## 2013-09-22 DIAGNOSIS — F329 Major depressive disorder, single episode, unspecified: Secondary | ICD-10-CM | POA: Diagnosis present

## 2013-09-22 DIAGNOSIS — I6340 Cerebral infarction due to embolism of unspecified cerebral artery: Secondary | ICD-10-CM | POA: Diagnosis not present

## 2013-09-22 DIAGNOSIS — G459 Transient cerebral ischemic attack, unspecified: Secondary | ICD-10-CM | POA: Diagnosis not present

## 2013-09-22 DIAGNOSIS — F419 Anxiety disorder, unspecified: Secondary | ICD-10-CM | POA: Diagnosis present

## 2013-09-22 DIAGNOSIS — I6529 Occlusion and stenosis of unspecified carotid artery: Secondary | ICD-10-CM | POA: Diagnosis not present

## 2013-09-22 DIAGNOSIS — R29818 Other symptoms and signs involving the nervous system: Secondary | ICD-10-CM | POA: Diagnosis not present

## 2013-09-22 DIAGNOSIS — I10 Essential (primary) hypertension: Secondary | ICD-10-CM

## 2013-09-22 DIAGNOSIS — R091 Pleurisy: Secondary | ICD-10-CM | POA: Diagnosis not present

## 2013-09-22 DIAGNOSIS — N390 Urinary tract infection, site not specified: Secondary | ICD-10-CM | POA: Diagnosis not present

## 2013-09-22 DIAGNOSIS — I672 Cerebral atherosclerosis: Secondary | ICD-10-CM | POA: Diagnosis not present

## 2013-09-22 DIAGNOSIS — M6281 Muscle weakness (generalized): Secondary | ICD-10-CM | POA: Diagnosis not present

## 2013-09-22 DIAGNOSIS — R269 Unspecified abnormalities of gait and mobility: Secondary | ICD-10-CM | POA: Diagnosis not present

## 2013-09-22 DIAGNOSIS — R531 Weakness: Secondary | ICD-10-CM | POA: Diagnosis present

## 2013-09-22 DIAGNOSIS — F32A Depression, unspecified: Secondary | ICD-10-CM

## 2013-09-22 DIAGNOSIS — I059 Rheumatic mitral valve disease, unspecified: Secondary | ICD-10-CM | POA: Diagnosis not present

## 2013-09-22 DIAGNOSIS — E119 Type 2 diabetes mellitus without complications: Secondary | ICD-10-CM

## 2013-09-22 HISTORY — DX: Depression, unspecified: F32.A

## 2013-09-22 HISTORY — DX: Essential (primary) hypertension: I10

## 2013-09-22 HISTORY — DX: Gastro-esophageal reflux disease without esophagitis: K21.9

## 2013-09-22 HISTORY — DX: Anxiety disorder, unspecified: F41.9

## 2013-09-22 LAB — LIPID PANEL
CHOLESTEROL: 123 mg/dL (ref 0–200)
HDL: 49 mg/dL (ref >39)
LDL Cholesterol: 56 mg/dL (ref 0–99)
Total CHOL/HDL Ratio: 2.5 RATIO
Triglycerides: 90 mg/dL (ref <150)
VLDL: 18 mg/dL (ref 0–40)

## 2013-09-22 LAB — COMPREHENSIVE METABOLIC PANEL
ALT: 16 U/L (ref 0–35)
AST: 22 U/L (ref 0–37)
Albumin: 3.7 g/dL (ref 3.5–5.2)
Alkaline Phosphatase: 52 U/L (ref 39–117)
BILIRUBIN TOTAL: 0.3 mg/dL (ref 0.3–1.2)
BUN: 31 mg/dL — ABNORMAL HIGH (ref 6–23)
CALCIUM: 9.4 mg/dL (ref 8.4–10.5)
CHLORIDE: 103 meq/L (ref 96–112)
CO2: 25 meq/L (ref 19–32)
Creatinine, Ser: 1.43 mg/dL — ABNORMAL HIGH (ref 0.50–1.10)
GFR, EST AFRICAN AMERICAN: 37 mL/min — AB (ref >90)
GFR, EST NON AFRICAN AMERICAN: 32 mL/min — AB (ref >90)
GLUCOSE: 91 mg/dL (ref 70–99)
Potassium: 4.2 mEq/L (ref 3.7–5.3)
Sodium: 142 mEq/L (ref 137–147)
Total Protein: 7 g/dL (ref 6.0–8.3)

## 2013-09-22 LAB — RAPID URINE DRUG SCREEN, HOSP PERFORMED
Amphetamines: POSITIVE — AB
BENZODIAZEPINES: NOT DETECTED
Barbiturates: NOT DETECTED
Cocaine: NOT DETECTED
Opiates: NOT DETECTED
Tetrahydrocannabinol: NOT DETECTED

## 2013-09-22 LAB — HEMOGLOBIN A1C
HEMOGLOBIN A1C: 6.3 % — AB (ref <5.7)
HEMOGLOBIN A1C: 6.3 % — AB (ref <5.7)
MEAN PLASMA GLUCOSE: 134 mg/dL — AB (ref <117)
MEAN PLASMA GLUCOSE: 134 mg/dL — AB (ref <117)

## 2013-09-22 LAB — BASIC METABOLIC PANEL
BUN: 31 mg/dL — ABNORMAL HIGH (ref 6–23)
CO2: 26 meq/L (ref 19–32)
Calcium: 8.2 mg/dL — ABNORMAL LOW (ref 8.4–10.5)
Chloride: 103 mEq/L (ref 96–112)
Creatinine, Ser: 1.39 mg/dL — ABNORMAL HIGH (ref 0.50–1.10)
GFR calc Af Amer: 38 mL/min — ABNORMAL LOW (ref >90)
GFR calc non Af Amer: 33 mL/min — ABNORMAL LOW (ref >90)
GLUCOSE: 180 mg/dL — AB (ref 70–99)
Potassium: 3.9 mEq/L (ref 3.7–5.3)
Sodium: 140 mEq/L (ref 137–147)

## 2013-09-22 LAB — CBC
HCT: 29.5 % — ABNORMAL LOW (ref 36.0–46.0)
HEMOGLOBIN: 9.9 g/dL — AB (ref 12.0–15.0)
MCH: 32.1 pg (ref 26.0–34.0)
MCHC: 33.6 g/dL (ref 30.0–36.0)
MCV: 95.8 fL (ref 78.0–100.0)
Platelets: 157 10*3/uL (ref 150–400)
RBC: 3.08 MIL/uL — AB (ref 3.87–5.11)
RDW: 13.1 % (ref 11.5–15.5)
WBC: 5.5 10*3/uL (ref 4.0–10.5)

## 2013-09-22 LAB — URINE MICROSCOPIC-ADD ON

## 2013-09-22 LAB — TROPONIN I

## 2013-09-22 LAB — URINALYSIS, ROUTINE W REFLEX MICROSCOPIC
GLUCOSE, UA: NEGATIVE mg/dL
KETONES UR: NEGATIVE mg/dL
NITRITE: NEGATIVE
PH: 5.5 (ref 5.0–8.0)
Protein, ur: NEGATIVE mg/dL
Specific Gravity, Urine: >1.03 — ABNORMAL HIGH (ref 1.005–1.030)
Urobilinogen, UA: 0.2 mg/dL (ref 0.0–1.0)

## 2013-09-22 LAB — GLUCOSE, CAPILLARY
GLUCOSE-CAPILLARY: 113 mg/dL — AB (ref 70–99)
GLUCOSE-CAPILLARY: 122 mg/dL — AB (ref 70–99)
Glucose-Capillary: 122 mg/dL — ABNORMAL HIGH (ref 70–99)
Glucose-Capillary: 129 mg/dL — ABNORMAL HIGH (ref 70–99)

## 2013-09-22 LAB — SODIUM, URINE, RANDOM: Sodium, Ur: 56 mEq/L

## 2013-09-22 LAB — ETHANOL: Alcohol, Ethyl (B): <11 mg/dL (ref 0–11)

## 2013-09-22 LAB — CREATININE, URINE, RANDOM: Creatinine, Urine: 133.43 mg/dL

## 2013-09-22 MED ORDER — SIMVASTATIN 20 MG PO TABS
20.0000 mg | ORAL_TABLET | Freq: Every day | ORAL | Status: DC
Start: 2013-09-22 — End: 2013-09-23
  Administered 2013-09-22: 20 mg via ORAL
  Filled 2013-09-22: qty 1

## 2013-09-22 MED ORDER — SODIUM CHLORIDE 0.9 % IV BOLUS (SEPSIS)
500.0000 mL | Freq: Once | INTRAVENOUS | Status: AC
  Administered 2013-09-22: 500 mL via INTRAVENOUS

## 2013-09-22 MED ORDER — HYOSCYAMINE SULFATE 0.125 MG SL SUBL: 0.1250 mg | SUBLINGUAL_TABLET | Freq: Three times a day (TID) | SUBLINGUAL | Status: DC | PRN

## 2013-09-22 MED ORDER — SENNOSIDES-DOCUSATE SODIUM 8.6-50 MG PO TABS: 1.0000 | ORAL_TABLET | Freq: Every evening | ORAL | Status: DC | PRN

## 2013-09-22 MED ORDER — SODIUM CHLORIDE 0.9 % IV SOLN
INTRAVENOUS | Status: DC
  Administered 2013-09-22 (×2): via INTRAVENOUS

## 2013-09-22 MED ORDER — CLONIDINE HCL 0.2 MG PO TABS
0.2000 mg | ORAL_TABLET | Freq: Two times a day (BID) | ORAL | Status: DC
  Administered 2013-09-22 – 2013-09-23 (×4): 0.2 mg via ORAL
  Filled 2013-09-22 (×4): qty 1

## 2013-09-22 MED ORDER — ASPIRIN 300 MG RE SUPP
300.0000 mg | Freq: Every day | RECTAL | Status: DC
  Filled 2013-09-22 (×3): qty 1

## 2013-09-22 MED ORDER — CIPROFLOXACIN IN D5W 400 MG/200ML IV SOLN
400.0000 mg | Freq: Once | INTRAVENOUS | Status: AC
  Administered 2013-09-22: 400 mg via INTRAVENOUS
  Filled 2013-09-22: qty 200

## 2013-09-22 MED ORDER — LOSARTAN POTASSIUM 50 MG PO TABS
100.0000 mg | ORAL_TABLET | Freq: Every day | ORAL | Status: DC
  Administered 2013-09-22 – 2013-09-23 (×2): 100 mg via ORAL
  Filled 2013-09-22 (×2): qty 2

## 2013-09-22 MED ORDER — ENOXAPARIN SODIUM 40 MG/0.4ML ~~LOC~~ SOLN
40.0000 mg | SUBCUTANEOUS | Status: DC
  Administered 2013-09-23: 40 mg via SUBCUTANEOUS
  Filled 2013-09-22: qty 0.4

## 2013-09-22 MED ORDER — ASPIRIN 325 MG PO TABS
325.0000 mg | ORAL_TABLET | Freq: Every day | ORAL | Status: DC
  Administered 2013-09-22 – 2013-09-23 (×2): 325 mg via ORAL
  Filled 2013-09-22 (×2): qty 1

## 2013-09-22 MED ORDER — NABUMETONE 500 MG PO TABS
500.0000 mg | ORAL_TABLET | Freq: Two times a day (BID) | ORAL | Status: DC | PRN
  Filled 2013-09-22: qty 1

## 2013-09-22 MED ORDER — PANTOPRAZOLE SODIUM 40 MG PO TBEC: 40.0000 mg | DELAYED_RELEASE_TABLET | Freq: Every day | ORAL | Status: DC

## 2013-09-22 MED ORDER — PANTOPRAZOLE SODIUM 40 MG PO TBEC
40.0000 mg | DELAYED_RELEASE_TABLET | Freq: Every day | ORAL | Status: DC
Start: 2013-09-22 — End: 2013-09-23
  Administered 2013-09-22 – 2013-09-23 (×2): 40 mg via ORAL
  Filled 2013-09-22 (×2): qty 1

## 2013-09-22 MED ORDER — ENOXAPARIN SODIUM 30 MG/0.3ML ~~LOC~~ SOLN
30.0000 mg | SUBCUTANEOUS | Status: DC
  Administered 2013-09-22: 30 mg via SUBCUTANEOUS
  Filled 2013-09-22: qty 0.3

## 2013-09-22 MED ORDER — ACETAMINOPHEN 650 MG RE SUPP: 650.0000 mg | Freq: Four times a day (QID) | RECTAL | Status: DC | PRN

## 2013-09-22 MED ORDER — CIPROFLOXACIN IN D5W 200 MG/100ML IV SOLN
200.0000 mg | Freq: Two times a day (BID) | INTRAVENOUS | Status: DC
  Filled 2013-09-22: qty 100

## 2013-09-22 MED ORDER — ACETAMINOPHEN 325 MG PO TABS: 650.0000 mg | ORAL_TABLET | Freq: Four times a day (QID) | ORAL | Status: DC | PRN

## 2013-09-22 MED ORDER — STROKE: EARLY STAGES OF RECOVERY BOOK
Freq: Once | Status: AC
  Administered 2013-09-22: 1
  Filled 2013-09-22: qty 1

## 2013-09-22 MED ORDER — INSULIN ASPART 100 UNIT/ML ~~LOC~~ SOLN
0.0000 [IU] | Freq: Three times a day (TID) | SUBCUTANEOUS | Status: DC
  Administered 2013-09-22 – 2013-09-23 (×3): 1 [IU] via SUBCUTANEOUS
  Administered 2013-09-23: 2 [IU] via SUBCUTANEOUS

## 2013-09-22 MED ORDER — FLUTICASONE PROPIONATE 50 MCG/ACT NA SUSP
1.0000 | Freq: Every day | NASAL | Status: DC
  Administered 2013-09-22 – 2013-09-23 (×2): 1 via NASAL
  Filled 2013-09-22: qty 16

## 2013-09-22 MED ORDER — METOPROLOL SUCCINATE ER 25 MG PO TB24
25.0000 mg | ORAL_TABLET | Freq: Every day | ORAL | Status: DC
  Administered 2013-09-22 – 2013-09-23 (×2): 25 mg via ORAL
  Filled 2013-09-22 (×2): qty 1

## 2013-09-22 MED ORDER — LORATADINE 10 MG PO TABS
10.0000 mg | ORAL_TABLET | Freq: Every day | ORAL | Status: DC
  Administered 2013-09-22 – 2013-09-23 (×2): 10 mg via ORAL
  Filled 2013-09-22 (×2): qty 1

## 2013-09-22 MED ORDER — CIPROFLOXACIN HCL 250 MG PO TABS
250.0000 mg | ORAL_TABLET | Freq: Two times a day (BID) | ORAL | Status: DC
  Administered 2013-09-22 – 2013-09-23 (×3): 250 mg via ORAL
  Filled 2013-09-22 (×3): qty 1

## 2013-09-22 MED ORDER — BUSPIRONE HCL 5 MG PO TABS
15.0000 mg | ORAL_TABLET | Freq: Two times a day (BID) | ORAL | Status: DC
  Administered 2013-09-22 – 2013-09-23 (×3): 15 mg via ORAL
  Filled 2013-09-22 (×3): qty 3

## 2013-09-22 MED ORDER — CITALOPRAM HYDROBROMIDE 20 MG PO TABS
40.0000 mg | ORAL_TABLET | Freq: Every day | ORAL | Status: DC
  Administered 2013-09-22 – 2013-09-23 (×2): 40 mg via ORAL
  Filled 2013-09-22 (×2): qty 2

## 2013-09-22 MED ORDER — MECLIZINE HCL 12.5 MG PO TABS: 12.5000 mg | ORAL_TABLET | Freq: Two times a day (BID) | ORAL | Status: DC | PRN

## 2013-09-22 NOTE — H&P (Signed)
[facility] History and Physical  Meghan Duran DGU:[id number] DOB: 1924/01/17 DOA: 09/21/2013  Referring physician: Dr. Christy Gentles PCP: [name], MD   Chief Complaint: Right-sided weakness and slurred speech  HPI: Meghan Duran is a 80 y.o. female  With history of hypertension, hyperlipidemia, diabetes mellitus, depression/anxiety who presented to the ED with sudden onset of right-sided weakness and numbness and slurred speech. Patient states that she was getting ready for bed around 10 PM the night prior to admission when she noted sudden onset of right upper extremity weakness and numbness as well as slurred speech. Patient stated that she hadn't felt well throughout the day. Patient denies any visual abnormalities and no gait abnormalities. Patient presented immediately to the emergency room approximately within 30 minutes of her symptoms. Patient's symptoms improved rapidly and a such she was deemed not a TPA candidate. Patient denied any fevers, no chills, no nausea, no vomiting, no chest pain, no shortness of breath, no dysuria, no diarrhea, no constipation. Patient states recently got over a bout of diverticulitis. Patient was in the emergency room CT of the head which was done was negative for any acute abnormalities. Basic metabolic profile done at a BUN of 31 creatinine of 1.43 otherwise was within normal limits. First set of troponin was negative. CBC was unremarkable. Urinalysis done was nitrite negative small leukocytes too numerous to count WBCs 3-6 RBCs many bacteria. EKG done showed a normal sinus rhythm. We were called to admit the patient for further evaluation and management.   Review of Systems: As per history of present illness otherwise negative Constitutional:  No weight loss, night sweats, Fevers, chills, fatigue.  HEENT:  No headaches, Difficulty swallowing,Tooth/dental problems,Sore throat,  No sneezing, itching, ear ache, nasal congestion, post nasal  drip,  Cardio-vascular:  No chest pain, Orthopnea, PND, swelling in lower extremities, anasarca, dizziness, palpitations  GI:  No heartburn, indigestion, abdominal pain, nausea, vomiting, diarrhea, change in bowel habits, loss of appetite  Resp:  No shortness of breath with exertion or at rest. No excess mucus, no productive cough, No non-productive cough, No coughing up of blood.No change in color of mucus.No wheezing.No chest wall deformity  Skin:  no rash or lesions.  GU:  no dysuria, change in color of urine, no urgency or frequency. No flank pain.  Musculoskeletal:  No joint pain or swelling. No decreased range of motion. No back pain.  Psych:  No change in mood or affect. No depression or anxiety. No memory loss.   Past Medical History  Diagnosis Date  . Hypertension   . Cancer   . Pernicious anemia   . Neuropathy     Feet and legs  . Diabetes mellitus without complication   . Depression 09/22/2013  . Anxiety 09/22/2013  . GERD (gastroesophageal reflux disease) 09/22/2013  . HTN (hypertension) 09/22/2013   Past Surgical History  Procedure Laterality Date  . Abdominal hysterectomy    . Cholecystectomy    . Appendectomy    . Bladder surgery    . Rectal surgery     Social History:  reports that she quit smoking about 27 years ago. She does not have any smokeless tobacco history on file. She reports that she does not drink alcohol or use illicit drugs.  Allergies  Allergen Reactions  . Amoxil [Amoxicillin]   . Cymbalta [Duloxetine Hcl]     Side effects  . Macrobid [Nitrofurantoin Macrocrystal]     Diarrhea and swollen lips  . Tramadol     dizzy  Family History  Problem Relation Age of Onset  . Diabetes Sister   . Heart attack Sister      Prior to Admission medications   Medication Sig Start Date End Date Taking? Authorizing Provider  busPIRone (BUSPAR) 15 MG tablet TAKE ONE TABLET BY MOUTH TWICE DAILY 04/30/13   [name], MD  cetirizine (ZYRTEC) 10 MG  tablet Take 1 tablet (10 mg total) by mouth daily. 11/20/12 11/20/13  [name], MD  citalopram (CELEXA) 40 MG tablet TAKE ONE TABLET BY MOUTH ONCE DAILY    [name], MD  cloNIDine (CATAPRES) 0.2 MG tablet TAKE ONE TABLET BY MOUTH TWICE DAILY 07/17/13   [name], MD  fluticasone (FLONASE) 50 MCG/ACT nasal spray Place 1 spray into the nose daily. 11/20/12 11/20/13  [name], MD  glipiZIDE (GLUCOTROL) 5 MG tablet Take 0.5 tablets (2.5 mg total) by mouth 2 (two) times daily before a meal. 05/27/13   [name], MD  hyoscyamine (LEVSIN SL) 0.125 MG SL tablet Place 1 tablet (0.125 mg total) under the tongue 3 (three) times daily as needed for cramping. 11/20/12   [name], MD  losartan (COZAAR) 100 MG tablet TAKE ONE TABLET BY MOUTH ONCE DAILY 06/23/13   [name], MD  losartan (COZAAR) 100 MG tablet TAKE ONE TABLET BY MOUTH ONCE DAILY 07/17/13   [name], MD  meclizine (ANTIVERT) 12.5 MG tablet TAKE ONE TABLET BY MOUTH TWICE DAILY AS NEEDED 04/30/13   [name], MD  metFORMIN (GLUCOPHAGE) 500 MG tablet TAKE ONE TABLET BY MOUTH TWICE DAILY WITH MEALS 07/17/13   [name], MD  metoprolol succinate (TOPROL-XL) 25 MG 24 hr tablet TAKE ONE TABLET BY MOUTH ONCE DAILY 04/30/13   [name], MD  nabumetone (RELAFEN) 500 MG tablet TAKE ONE TABLET BY MOUTH TWICE DAILY AS NEEDED FOR PAIN 04/30/13   [name], MD  pantoprazole (PROTONIX) 40 MG tablet Take 1 tablet (40 mg total) by mouth daily. 03/25/13 03/25/14  [name], MD  pravastatin (PRAVACHOL) 40 MG tablet Take 1 tablet (40 mg total) by mouth every evening. 05/27/13 05/27/14  [name], MD  QC ALL DAY ALLERGY 10 MG tablet TAKE ONE TABLET BY MOUTH DAILY 04/30/13   [name], MD  TRUETRACK TEST test strip TEST ONCE A DAY 04/30/13   [name], MD   Physical Exam: Filed Vitals:   09/22/13 0136  BP: 130/52  Pulse: 85  Temp:   Resp: 18    BP 130/52  Pulse 85  Temp(Src) 97.8 F  (36.6 C) (Oral)  Resp 18  Ht 5' 5.5" (1.664 m)  Wt 87.998 kg (194 lb)  BMI 31.78 kg/m2  SpO2 95%  General:  Appears calm and comfortable. Well-developed well-nourished in no acute cardiopulmonary distress. Eyes: EOMI, PERRLA, normal lids, irises & conjunctiva ENT: grossly normal hearing, lips & tongue Neck: no LAD, masses or thyromegaly Cardiovascular: RRR, no m/r/g. No LE edema. Respiratory: CTA bilaterally, no w/r/r. Normal respiratory effort. Abdomen: soft, nd, positive bowel sounds. Slight discomfort in tenderness in the right lower quadrant to palpation. No rebound no guarding. Skin: no rash or induration seen on limited exam Musculoskeletal: grossly normal tone BUE/BLE Psychiatric: grossly normal mood and affect, speech fluent and appropriate Neurologic: Alert and oriented x3. Cranial nerves II through XII are grossly intact. Sensation is intact. Visual fields are intact. Unable to elicit reflexes symmetrically and is diffusely. 5/5 BUE  strength, 5/5 BLE strength. Gait not tested secondary to safety.          Labs on Admission:  Basic Metabolic Panel:  Recent Labs Lab 09/21/13 2326 09/21/13 2336  NA 142 142  K 4.2 3.9  CL 103 105  CO2 25  --   GLUCOSE 91 85  BUN 31* 28*  CREATININE 1.43* 1.50*  CALCIUM 9.4  --    Liver Function Tests:  Recent Labs Lab 09/21/13 2326  AST 22  ALT 16  ALKPHOS 52  BILITOT 0.3  PROT 7.0  ALBUMIN 3.7   No results found for this basename: LIPASE, AMYLASE,  in the last 168 hours No results found for this basename: AMMONIA,  in the last 168 hours CBC:  Recent Labs Lab 09/21/13 2326 09/21/13 2336  WBC 7.7  --   NEUTROABS 3.9  --   HGB 12.2 12.9  HCT 37.1 38.0  MCV 94.9  --   PLT 232  --    Cardiac Enzymes:  Recent Labs Lab 09/21/13 2326  TROPONINI <0.30    BNP (last 3 results) No results found for this basename: PROBNP,  in the last 8760 hours CBG: No results found for this basename: GLUCAP,  in the last 168  hours  Radiological Exams on Admission: Ct Head Wo Contrast  09/22/2013   CLINICAL DATA:  Right arm weakness.  Slurred speech.  EXAM: CT HEAD WITHOUT CONTRAST  TECHNIQUE: Contiguous axial images were obtained from the base of the skull through the vertex without intravenous contrast.  COMPARISON:  Head CT 03/06/2010.  FINDINGS: Mild cerebral and cerebellar atrophy. Patchy and confluent areas of decreased attenuation are noted throughout the deep and periventricular white matter of the cerebral hemispheres bilaterally, compatible with chronic microvascular ischemic disease. No acute intracranial abnormalities. Specifically, no evidence of acute intracranial hemorrhage, no definite findings of acute/subacute cerebral ischemia, no mass, mass effect, hydrocephalus or abnormal intra or extra-axial fluid collections. Visualized paranasal sinuses and mastoids are well pneumatized, with exception of opacification of the right frontal sinus. No acute displaced skull fractures are identified.  IMPRESSION: 1. No acute intracranial abnormalities. 2. Mild cerebral and cerebellar atrophy with chronic microvascular ischemic changes in the cerebral white matter. 3. Opacification of the right frontal sinus. Although this may be chronic, clinical correlation for signs and symptoms of acute sinusitis is recommended.   Electronically Signed   By: Vinnie Langton M.D.   On: 09/22/2013 00:20    EKG: Independently reviewed. Normal sinus rhythm  Assessment/Plan Principal Problem:   TIA (transient ischemic attack) Active Problems:   Right sided weakness   UTI (lower urinary tract infection)   ARF (acute renal failure)   Pernicious anemia   DM II (diabetes mellitus, type II), controlled   Renal insufficiency   Hyperlipemia   Diabetic neuropathy, type II diabetes mellitus   Depression   Anxiety   GERD (gastroesophageal reflux disease)   HTN (hypertension)   #1 probable TIA/right-sided weakness Patient had  presented with right-sided weakness and numbness with some associated slurred speech that improved rapidly when she arrived to the ED was close to her baseline by the time of my interview. Patient was deemed not a TPA candidate secondary to rapid improvement of her symptoms. Patient does have multiple risk factors of hypertension hyperlipidemia and diabetes. Will admit the patient to telemetry. CT of the head which was done was negative for any acute abnormalities. Check MRI/MRA of the head. Check carotid Dopplers. Check a 2-D echo. PT/OT/SLP.Check  a lipid panel. Check a hemoglobin A1c. Continue patient's statin for her hyperlipidemia. Patient was on a baby aspirin prior to admission we'll place on a full dose aspirin of 325 mg daily for secondary stroke prevention. Will consult neurology for further evaluation and management.  #2 urinary tract infection Check a urine cultures. Place on IV ciprofloxacin.  #3 acute renal failure Likely secondary to prerenal azotemia. Will check a urine sodium. Check a urine creatinine. Check a renal ultrasound. Hydrate gently with IV fluids. Follow. Monitor urinary output.  #4 type 2 diabetes Check a hemoglobin A1c. Hold oral hypoglycemic agents. Sliding scale insulin.  #5 depression/anxiety Stable. Continue home regimen of BuSpar, Celexa.  #6 hypertension Stable. Continue clonidine, Cozaar, Toprol-XL.  #7 hyperlipidemia Check a fasting lipid panel. Continue statin.  #8 gastroesophageal reflux disease PPI.  #9 prophylaxis PPI for GI prophylaxis. Lovenox for DVT prophylaxis.  Code Status: Full Family Communication: Updated patient at bedside. No family present. Disposition Plan: Admit to telemetry.  Time spent: [facility] Pager [phone]

## 2013-09-22 NOTE — Evaluation (Addendum)
Speech Language Pathology Evaluation Patient Details Name: Meghan Duran MRN: [id number] DOB: Jan 21, 1924 Today's Date: 09/22/2013 Time: 9509-3267 SLP Time Calculation (min): 27 min  Problem List:  Patient Active Problem List   Diagnosis Date Noted  . TIA (transient ischemic attack) 09/22/2013  . Right sided weakness 09/22/2013  . Depression 09/22/2013  . Anxiety 09/22/2013  . GERD (gastroesophageal reflux disease) 09/22/2013  . UTI (lower urinary tract infection) 09/22/2013  . ARF (acute renal failure) 09/22/2013  . HTN (hypertension) 09/22/2013  . Thyroid nodule 09/22/2013  . Diabetic neuropathy, type II diabetes mellitus 02/19/2013  . Heme positive stool 02/19/2013  . Pernicious anemia 11/20/2012  . DM II (diabetes mellitus, type II), controlled 11/20/2012  . Renal insufficiency 11/20/2012  . Abdominal pain, unspecified site 11/20/2012  . Hyperlipemia 11/20/2012   Past Medical History:  Past Medical History  Diagnosis Date  . Hypertension   . Cancer   . Pernicious anemia   . Neuropathy     Feet and legs  . Diabetes mellitus without complication   . Depression 09/22/2013  . Anxiety 09/22/2013  . GERD (gastroesophageal reflux disease) 09/22/2013  . HTN (hypertension) 09/22/2013  . Thyroid nodule    Past Surgical History:  Past Surgical History  Procedure Laterality Date  . Abdominal hysterectomy    . Cholecystectomy    . Appendectomy    . Bladder surgery    . Rectal surgery     HPI:  Patient had presented with right-sided weakness and numbness with some associated slurred speech that improved rapidly. Currently at baseline.  Patient was deemed not a TPA candidate secondary to rapid improvement of her symptoms. Patient does have multiple risk factors of hypertension hyperlipidemia and diabetes. CT of the head which was done was negative for any acute abnormalities. Await MRI/MRA of the head. Await carotid Dopplers and 2-D echo. PT/OT/SLP. Lipid panel within the limits  of normal. Hemoglobin A1c penidng. Continue patient's statin and aspirin 325 mg daily for secondary stroke prevention. Evaluated by Dr. Olene Craven neurology who opines left hemispheric infarct likely small cortical event.      Assessment / Plan / Recommendation Clinical Impression  Meghan Duran is extremely pleasant and able to recount history from last night and days' events without incident. Overall cognitive linguistic skills appear to be at baseline. Pt reports mild word finding difficulties at times which started several months ago. Pt demonstrated mild naming deficits, but easily retrieved the word when given a phonemic cue. No futher SLP services indicated in acute setting. Pt will be moving into ALF after discharge and may benefit from speech consult once settled if word finding deficits persist.     SLP Assessment  Patient does not need any further Speech Lanaguage Pathology Services    Follow Up Recommendations  None          SLP Goals   N/A  SLP Evaluation Prior Functioning  Cognitive/Linguistic Baseline: Within functional limits Type of Home: Assisted living (Lives in [place] in winters and [place] in summers) Vocation: Retired (Oncologist)   Cognition  Overall Cognitive Status: Within Functional Limits for tasks assessed Arousal/Alertness: Awake/alert Orientation Level: Oriented X4 Memory: Appears intact Awareness: Appears intact (? mild right neglect) Problem Solving: Appears intact Safety/Judgment: Appears intact    Comprehension  Auditory Comprehension Overall Auditory Comprehension: Appears within functional limits for tasks assessed Yes/No Questions: Within Functional Limits Commands: Within Functional Limits Conversation: Complex Visual Recognition/Discrimination Discrimination: Within Function Limits Reading Comprehension Reading Status: Within funtional limits  Expression Expression Primary Mode of Expression: Verbal Verbal Expression Overall  Verbal Expression: Impaired Initiation: No impairment Level of Generative/Spontaneous Verbalization: Conversation Repetition: No impairment Naming: Impairment Responsive: 76-100% accurate Confrontation: Impaired Convergent: 75-100% accurate Divergent: Not tested Pragmatics: No impairment Non-Verbal Means of Communication: Not applicable Other Verbal Expression Comments: Pt reports mild word finding difficulties for the past several months which she attributes to age Written Expression Dominant Hand: Right Written Expression: Not tested   Oral / Motor Oral Motor/Sensory Function Overall Oral Motor/Sensory Function: Appears within functional limits for tasks assessed Motor Speech Overall Motor Speech: Appears within functional limits for tasks assessed Respiration: Within functional limits Phonation: Normal Resonance: Within functional limits Articulation: Within functional limitis Intelligibility: Intelligible Motor Planning: Witnin functional limits Motor Speech Errors: Not applicable   G-Codes:  Language Expression, Initial: CI, Goal: CI, Discharge: CI     Thank you,  Genene Churn, [place] 09/22/2013, 4:11 PM

## 2013-09-22 NOTE — Consult Note (Addendum)
[place] A. [name], MD     [email or web address]          Meghan Duran is an 80 y.o. female.   ASSESSMENT/PLAN: 1. Acute left hemispheric infarct. This seems to be a cortical event that is likely small. Risk factors are hypertension, age, Diabetes and dyslipidemia. Aspirin has been started appropriately. The workup will include the typical testing for carotid occlusive disease, echocardiography and MRI/MRA.  2. Symmetric tremors of unclear etiology. This possibly could be due to a serotonergic effects from BuSpar and Celexa.This be observed for now.  3. Diaphoresis of unclear etiology.  4. Dizziness/gait instability and standing suggestive of orthostatic instability/autonomic disorder. Orthostatics will be obtained.  This 80 year old right-handed white female who presents with the acute onset of severe right upper extremity monoparesis, dysarthria and the possibly aphasia. The event lasted only for about 5 minutes and improved dramatically. She however has not completely back to normal. So reports that the right upper extremity is not at baseline although it's markedly improved. She does not report having right leg weakness. It appears that she did have a fall early in the morning before the event occurred. Event occurred about 10P.m. and was dramatically improved by temperature seconds in the hospital 30 minutes. TPA was not administered because of rapidly improving deficit. She does not report any concurrent headaches, dypnea, chest pain and GI or GU symptoms. She has been having diaphoresis occurs episodically over the last few weeks. She also has had symmetric tremors involving the upper extremities for the last 2 weeks. She has had episodes of gait instability and dizziness if she stands up too quickly. Occasionally she falls from this.  She fell yesterday in the morning. Again, this happened on standing quickly. No loss of consciousness as reported. Review of systems  otherwise unremarkable.  GENERAL: Simply pleasant mildly obese lady in no acute distress.  HEENT: Supple. Atraumatic normocephalic.   ABDOMEN: soft  EXTREMITIES: No edema. There is significant arthritic changes of the knees and hands bilaterally.   BACK: Normal.  SKIN: Normal by inspection.    MENTAL STATUS: Alert and oriented. Speech, language and cognition are generally intact. Judgment and insight normal.   CRANIAL NERVES: Pupils are equal, round and reactive to light and accommodation; extra ocular movements are full, there is no significant nystagmus; visual fields are full; upper and lower facial muscles are normal in strength and symmetric, there is no flattening of the nasolabial folds; tongue is midline; uvula is midline; shoulder elevation is normal.  MOTOR: There is mild weakness of deltoids bilaterally 4+/5. Triceps 5 and handgrip 5. Right hip flexion 4/5. Dorsiflexion 4+/5. Left lower extremity normal hip flexion and dorsiflexion.  COORDINATION: Left finger to nose is normal, right finger to nose is normal, No rest tremor; no intention tremor; no postural tremor; no bradykinesia.  REFLEXES: Deep tendon reflexes are symmetrical and normal. Babinski reflexes are flexor on L; Equivocal on the right.   SENSATION: Normal to light touch.      Past Medical History  Diagnosis Date  . Hypertension   . Cancer   . Pernicious anemia   . Neuropathy     Feet and legs  . Diabetes mellitus without complication   . Depression 09/22/2013  . Anxiety 09/22/2013  . GERD (gastroesophageal reflux disease) 09/22/2013  . HTN (hypertension) 09/22/2013    Past Surgical History  Procedure Laterality Date  . Abdominal hysterectomy    . Cholecystectomy    . Appendectomy    .  Bladder surgery    . Rectal surgery      Family History  Problem Relation Age of Onset  . Diabetes Sister   . Heart attack Sister     Social History:  reports that she quit smoking about 27 years ago. She  does not have any smokeless tobacco history on file. She reports that she does not drink alcohol or use illicit drugs.  Allergies:  Allergies  Allergen Reactions  . Amoxil [Amoxicillin]   . Cymbalta [Duloxetine Hcl]     Side effects  . Macrobid [Nitrofurantoin Macrocrystal]     Diarrhea and swollen lips  . Tramadol     dizzy    Medications: Prior to Admission medications   Medication Sig Start Date End Date Taking? Authorizing Provider  busPIRone (BUSPAR) 15 MG tablet TAKE ONE TABLET BY MOUTH TWICE DAILY 04/30/13   [name], MD  cetirizine (ZYRTEC) 10 MG tablet Take 1 tablet (10 mg total) by mouth daily. 11/20/12 11/20/13  [name], MD  citalopram (CELEXA) 40 MG tablet TAKE ONE TABLET BY MOUTH ONCE DAILY    [name], MD  cloNIDine (CATAPRES) 0.2 MG tablet TAKE ONE TABLET BY MOUTH TWICE DAILY 07/17/13   [name], MD  fluticasone (FLONASE) 50 MCG/ACT nasal spray Place 1 spray into the nose daily. 11/20/12 11/20/13  [name], MD  glipiZIDE (GLUCOTROL) 5 MG tablet Take 0.5 tablets (2.5 mg total) by mouth 2 (two) times daily before a meal. 05/27/13   [name], MD  hyoscyamine (LEVSIN SL) 0.125 MG SL tablet Place 1 tablet (0.125 mg total) under the tongue 3 (three) times daily as needed for cramping. 11/20/12   [name], MD  losartan (COZAAR) 100 MG tablet TAKE ONE TABLET BY MOUTH ONCE DAILY 06/23/13   [name], MD  losartan (COZAAR) 100 MG tablet TAKE ONE TABLET BY MOUTH ONCE DAILY 07/17/13   [name], MD  meclizine (ANTIVERT) 12.5 MG tablet TAKE ONE TABLET BY MOUTH TWICE DAILY AS NEEDED 04/30/13   [name], MD  metFORMIN (GLUCOPHAGE) 500 MG tablet TAKE ONE TABLET BY MOUTH TWICE DAILY WITH MEALS 07/17/13   [name], MD  metoprolol succinate (TOPROL-XL) 25 MG 24 hr tablet TAKE ONE TABLET BY MOUTH ONCE DAILY 04/30/13   [name], MD  nabumetone (RELAFEN) 500 MG tablet TAKE ONE TABLET BY MOUTH TWICE DAILY AS NEEDED FOR PAIN 04/30/13    [name], MD  pantoprazole (PROTONIX) 40 MG tablet Take 1 tablet (40 mg total) by mouth daily. 03/25/13 03/25/14  [name], MD  pravastatin (PRAVACHOL) 40 MG tablet Take 1 tablet (40 mg total) by mouth every evening. 05/27/13 05/27/14  [name], MD  QC ALL DAY ALLERGY 10 MG tablet TAKE ONE TABLET BY MOUTH DAILY 04/30/13   [name], MD  TRUETRACK TEST test strip TEST ONCE A DAY 04/30/13   [name], MD    Scheduled Meds: . aspirin  300 mg Rectal Daily   Or  . aspirin  325 mg Oral Daily  . busPIRone  15 mg Oral BID  . ciprofloxacin  200 mg Intravenous Q12H  . citalopram  40 mg Oral Daily  . cloNIDine  0.2 mg Oral BID  . enoxaparin (LOVENOX) injection  30 mg Subcutaneous Q24H  . fluticasone  1 spray Each Nare Daily  . insulin aspart  0-9 Units Subcutaneous TID WC  . loratadine  10 mg  Oral Daily  . losartan  100 mg Oral Daily  . metoprolol succinate  25 mg Oral Daily  . pantoprazole  40 mg Oral Daily  . pantoprazole  40 mg Oral Q0600  . simvastatin  20 mg Oral q1800   Continuous Infusions: . sodium chloride 75 mL/hr at 09/22/13 0448   PRN Meds:.acetaminophen, acetaminophen, hyoscyamine, meclizine, nabumetone, senna-docusate   Blood pressure 100/58, pulse 74, temperature 97 F (36.1 C), temperature source Oral, resp. rate 18, height 5' 5"  (1.651 m), weight 87.544 kg (193 lb), SpO2 97.00%.   Results for orders placed during the hospital encounter of 09/21/13 (from the past 48 hour(s))  ETHANOL     Status: None   Collection Time    09/21/13 11:26 PM      Result Value Ref Range   Alcohol, Ethyl (B) <11  0 - 11 mg/dL   Comment:            LOWEST DETECTABLE LIMIT FOR     SERUM ALCOHOL IS 11 mg/dL     FOR MEDICAL PURPOSES ONLY  PROTIME-INR     Status: None   Collection Time    09/21/13 11:26 PM      Result Value Ref Range   Prothrombin Time 11.8  11.6 - 15.2 seconds   INR 0.88  0.00 - 1.49  APTT     Status: None   Collection Time    09/21/13 11:26  PM      Result Value Ref Range   aPTT 27  24 - 37 seconds  CBC     Status: None   Collection Time    09/21/13 11:26 PM      Result Value Ref Range   WBC 7.7  4.0 - 10.5 K/uL   RBC 3.91  3.87 - 5.11 MIL/uL   Hemoglobin 12.2  12.0 - 15.0 g/dL   HCT 37.1  36.0 - 46.0 %   MCV 94.9  78.0 - 100.0 fL   MCH 31.2  26.0 - 34.0 pg   MCHC 32.9  30.0 - 36.0 g/dL   RDW 13.0  11.5 - 15.5 %   Platelets 232  150 - 400 K/uL  DIFFERENTIAL     Status: None   Collection Time    09/21/13 11:26 PM      Result Value Ref Range   Neutrophils Relative % 51  43 - 77 %   Neutro Abs 3.9  1.7 - 7.7 K/uL   Lymphocytes Relative 32  12 - 46 %   Lymphs Abs 2.4  0.7 - 4.0 K/uL   Monocytes Relative 12  3 - 12 %   Monocytes Absolute 0.9  0.1 - 1.0 K/uL   Eosinophils Relative 5  0 - 5 %   Eosinophils Absolute 0.4  0.0 - 0.7 K/uL   Basophils Relative 1  0 - 1 %   Basophils Absolute 0.1  0.0 - 0.1 K/uL  COMPREHENSIVE METABOLIC PANEL     Status: Abnormal   Collection Time    09/21/13 11:26 PM      Result Value Ref Range   Sodium 142  137 - 147 mEq/L   Potassium 4.2  3.7 - 5.3 mEq/L   Chloride 103  96 - 112 mEq/L   CO2 25  19 - 32 mEq/L   Glucose, Bld 91  70 - 99 mg/dL   BUN 31 (*) 6 - 23 mg/dL   Creatinine, Ser 1.43 (*) 0.50 - 1.10 mg/dL   Calcium 9.4  8.4 - 10.5 mg/dL   Total Protein 7.0  6.0 - 8.3 g/dL   Albumin 3.7  3.5 - 5.2 g/dL   AST 22  0 - 37 U/L   ALT 16  0 - 35 U/L   Alkaline Phosphatase 52  39 - 117 U/L   Total Bilirubin 0.3  0.3 - 1.2 mg/dL   GFR calc non Af Amer 32 (*) >90 mL/min   GFR calc Af Amer 37 (*) >90 mL/min   Comment: (NOTE)     The eGFR has been calculated using the CKD EPI equation.     This calculation has not been validated in all clinical situations.     eGFR's persistently <90 mL/min signify possible Chronic Kidney     Disease.  TROPONIN I     Status: None   Collection Time    09/21/13 11:26 PM      Result Value Ref Range   Troponin I <0.30  <0.30 ng/mL   Comment:             Due to the release kinetics of cTnI,     a negative result within the first hours     of the onset of symptoms does not rule out     myocardial infarction with certainty.     If myocardial infarction is still suspected,     repeat the test at appropriate intervals.  I-STAT CHEM 8, ED     Status: Abnormal   Collection Time    09/21/13 11:36 PM      Result Value Ref Range   Sodium 142  137 - 147 mEq/L   Potassium 3.9  3.7 - 5.3 mEq/L   Chloride 105  96 - 112 mEq/L   BUN 28 (*) 6 - 23 mg/dL   Creatinine, Ser 1.50 (*) 0.50 - 1.10 mg/dL   Glucose, Bld 85  70 - 99 mg/dL   Calcium, Ion 1.17  1.13 - 1.30 mmol/L   TCO2 23  0 - 100 mmol/L   Hemoglobin 12.9  12.0 - 15.0 g/dL   HCT 38.0  36.0 - 46.0 %  URINE RAPID DRUG SCREEN (HOSP PERFORMED)     Status: Abnormal   Collection Time    09/22/13 12:06 AM      Result Value Ref Range   Opiates NONE DETECTED  NONE DETECTED   Cocaine NONE DETECTED  NONE DETECTED   Benzodiazepines NONE DETECTED  NONE DETECTED   Amphetamines POSITIVE (*) NONE DETECTED   Tetrahydrocannabinol NONE DETECTED  NONE DETECTED   Barbiturates NONE DETECTED  NONE DETECTED   Comment:            DRUG SCREEN FOR MEDICAL PURPOSES     ONLY.  IF CONFIRMATION IS NEEDED     FOR ANY PURPOSE, NOTIFY LAB     WITHIN 5 DAYS.                LOWEST DETECTABLE LIMITS     FOR URINE DRUG SCREEN     Drug Class       Cutoff (ng/mL)     Amphetamine      1000     Barbiturate      200     Benzodiazepine   826     Tricyclics       415     Opiates          300     Cocaine          300  THC              50  URINALYSIS, ROUTINE W REFLEX MICROSCOPIC     Status: Abnormal   Collection Time    09/22/13 12:06 AM      Result Value Ref Range   Color, Urine YELLOW  YELLOW   APPearance CLEAR  CLEAR   Specific Gravity, Urine >1.030 (*) 1.005 - 1.030   pH 5.5  5.0 - 8.0   Glucose, UA NEGATIVE  NEGATIVE mg/dL   Hgb urine dipstick TRACE (*) NEGATIVE   Bilirubin Urine SMALL (*) NEGATIVE    Ketones, ur NEGATIVE  NEGATIVE mg/dL   Protein, ur NEGATIVE  NEGATIVE mg/dL   Urobilinogen, UA 0.2  0.0 - 1.0 mg/dL   Nitrite NEGATIVE  NEGATIVE   Leukocytes, UA SMALL (*) NEGATIVE  URINE MICROSCOPIC-ADD ON     Status: Abnormal   Collection Time    09/22/13 12:06 AM      Result Value Ref Range   Squamous Epithelial / LPF FEW (*) RARE   WBC, UA TOO NUMEROUS TO COUNT  <3 WBC/hpf   RBC / HPF 3-6  <3 RBC/hpf   Bacteria, UA MANY (*) RARE  LIPID PANEL     Status: None   Collection Time    09/22/13  5:35 AM      Result Value Ref Range   Cholesterol 123  0 - 200 mg/dL   Triglycerides 90  <150 mg/dL   HDL 49  >39 mg/dL   Total CHOL/HDL Ratio 2.5     VLDL 18  0 - 40 mg/dL   LDL Cholesterol 56  0 - 99 mg/dL   Comment:            Total Cholesterol/HDL:CHD Risk     Coronary Heart Disease Risk Table                         Men   Women      1/2 Average Risk   3.4   3.3      Average Risk       5.0   4.4      2 X Average Risk   9.6   7.1      3 X Average Risk  23.4   11.0                Use the calculated Patient Ratio     above and the CHD Risk Table     to determine the patient's CHD Risk.                ATP III CLASSIFICATION (LDL):      <100     mg/dL   Optimal      100-129  mg/dL   Near or Above                        Optimal      130-159  mg/dL   Borderline      160-189  mg/dL   High      >190     mg/dL   Very High  CBC     Status: Abnormal   Collection Time    09/22/13  5:35 AM      Result Value Ref Range   WBC 5.5  4.0 - 10.5 K/uL   RBC 3.08 (*) 3.87 - 5.11 MIL/uL   Hemoglobin 9.9 (*) 12.0 - 15.0 g/dL  Comment: DELTA CHECK NOTED     RESULT REPEATED AND VERIFIED   HCT 29.5 (*) 36.0 - 46.0 %   MCV 95.8  78.0 - 100.0 fL   MCH 32.1  26.0 - 34.0 pg   MCHC 33.6  30.0 - 36.0 g/dL   RDW 13.1  11.5 - 15.5 %   Platelets 157  150 - 400 K/uL   Comment: DELTA CHECK NOTED  BASIC METABOLIC PANEL     Status: Abnormal   Collection Time    09/22/13  5:35 AM      Result Value Ref  Range   Sodium 140  137 - 147 mEq/L   Potassium 3.9  3.7 - 5.3 mEq/L   Chloride 103  96 - 112 mEq/L   CO2 26  19 - 32 mEq/L   Glucose, Bld 180 (*) 70 - 99 mg/dL   BUN 31 (*) 6 - 23 mg/dL   Creatinine, Ser 1.39 (*) 0.50 - 1.10 mg/dL   Calcium 8.2 (*) 8.4 - 10.5 mg/dL   GFR calc non Af Amer 33 (*) >90 mL/min   GFR calc Af Amer 38 (*) >90 mL/min   Comment: (NOTE)     The eGFR has been calculated using the CKD EPI equation.     This calculation has not been validated in all clinical situations.     eGFR's persistently <90 mL/min signify possible Chronic Kidney     Disease.  GLUCOSE, CAPILLARY     Status: Abnormal   Collection Time    09/22/13  7:28 AM      Result Value Ref Range   Glucose-Capillary 122 (*) 70 - 99 mg/dL   Comment 1 Notify RN      Ct Head Wo Contrast  09/22/2013   CLINICAL DATA:  Right arm weakness.  Slurred speech.  EXAM: CT HEAD WITHOUT CONTRAST  TECHNIQUE: Contiguous axial images were obtained from the base of the skull through the vertex without intravenous contrast.  COMPARISON:  Head CT 03/06/2010.  FINDINGS: Mild cerebral and cerebellar atrophy. Patchy and confluent areas of decreased attenuation are noted throughout the deep and periventricular white matter of the cerebral hemispheres bilaterally, compatible with chronic microvascular ischemic disease. No acute intracranial abnormalities. Specifically, no evidence of acute intracranial hemorrhage, no definite findings of acute/subacute cerebral ischemia, no mass, mass effect, hydrocephalus or abnormal intra or extra-axial fluid collections. Visualized paranasal sinuses and mastoids are well pneumatized, with exception of opacification of the right frontal sinus. No acute displaced skull fractures are identified.  IMPRESSION: 1. No acute intracranial abnormalities. 2. Mild cerebral and cerebellar atrophy with chronic microvascular ischemic changes in the cerebral white matter. 3. Opacification of the right frontal sinus.  Although this may be chronic, clinical correlation for signs and symptoms of acute sinusitis is recommended.   Electronically Signed   By: Vinnie Langton M.D.   On: 09/22/2013 00:20        Yakub Lodes A. [name], M.D.  Diplomate, Tax adviser of Psychiatry and Neurology ( Neurology). 09/22/2013, 8:38 AM

## 2013-09-22 NOTE — Evaluation (Signed)
Physical Therapy Evaluation Patient Details Name: Meghan Duran MRN: [id number] DOB: 11-28-1923 Today's Date: 09/22/2013 Time: 1121-6244 PT Time Calculation (min): 36 min  PT Assessment / Plan / Recommendation History of Present Illness  Pt is admitted with right sided weakness and slurred speech.  Pt states that she just hasn't felt very well for the past few weeks.  She also reports some old left leg weakness and has difficulty placing the limb at times, trips over rugs, etc.  She was supposed to move into an independent living facility today.  Clinical Impression   Pt was seen for evaluation.  She is extremely pleasant and cooperative and all initial sx at time of admission have resolved.   Her only c/o are of a fine tremor in both hands and weakness in the LLE.  She had been using a cane, but upon evaluation I am recommending that she use a walker instead because of the LLE weakness.  I am also recommending that she have HHPT at d/c.  She is agreeable.    PT Assessment  All further PT needs can be met in the next venue of care    Follow Up Recommendations  Home health PT               Equipment Recommendations  None recommended by PT             Precautions / Restrictions Precautions Precautions: Fall Restrictions Weight Bearing Restrictions: No         Mobility  Bed Mobility Overal bed mobility: Modified Independent Transfers Overall transfer level: Modified independent Equipment used: Straight cane Ambulation/Gait Ambulation/Gait assistance: Supervision Ambulation Distance (Feet): 80 Feet Assistive device: Straight cane Gait Pattern/deviations: Decreased step length - left;Decreased stance time - left Gait velocity interpretation: Below normal speed for age/gender General Gait Details: pt would be safer using a walker, in my opinion         PT Diagnosis: Difficulty walking;Abnormality of gait  PT Problem List: Decreased strength;Decreased mobility PT  Treatment Interventions:       PT Goals(Current goals can be found in the care plan section) Acute Rehab PT Goals PT Goal Formulation: No goals set, d/c therapy  Visit Information  Last PT Received On: 09/22/13 History of Present Illness: Pt is admitted with right sided weakness and slurred speech.  Pt states that she just hasn't felt very well for the past few weeks.  She also reports some old left leg weakness and has difficulty placing the limb at times, trips over rugs, etc.  She was supposed to move into an independent living facility today.       Prior [place] expects to be discharged to:: Assisted living Home Equipment: [name] - single point;Walker - 2 wheels Prior Function Level of Independence: Independent with assistive device(s) Comments: uses a cane, but frequently reaches for furniture, counters with the other hand Communication Communication: No difficulties    Cognition  Cognition Arousal/Alertness: Awake/alert Behavior During Therapy: WFL for tasks assessed/performed Overall Cognitive Status: Within Functional Limits for tasks assessed    Extremity/Trunk Assessment Lower Extremity Assessment Lower Extremity Assessment: LLE deficits/detail LLE Deficits / Details: weakness noted through the entire LE, generally 3/5   Balance Balance Overall balance assessment: No apparent balance deficits (not formally assessed)  End of Session PT - End of Session Equipment Utilized During Treatment: Gait belt Activity Tolerance: Patient tolerated treatment well Patient left: in chair (pt transported down for [place])  GP  Meghan Duran L 09/22/2013, 1:40 PM

## 2013-09-22 NOTE — Care Management Note (Signed)
    Page 1 of 2   09/22/2013     2:13:14 PM   CARE MANAGEMENT NOTE 09/22/2013  Patient:  DESEREE, ZEMAITIS   Account Number:  [id number]  Date Initiated:  09/22/2013  Documentation initiated by:  Claretha Cooper  Subjective/Objective Assessment:   Pt admitted from home. Actually lived in [place] and moving to [facility] in [place] which per [facility] is independent living. Pt has been using a walker owed by her late husband which is very large. Pt has requested an appropriate size RW. PT has     Action/Plan:   recommended  HH PT. Spoke with her Daughter [name] who selected [facility]. Daughter agreeable with plan.   Anticipated DC Date:     Anticipated DC Plan:  HOME/SELF CARE  In-house referral  Clinical Social Worker      DC Forensic scientist  CM consult      PAC Choice  [place]   Choice offered to / List presented to:  C-4 Adult Children   DME arranged  [place]      DME agency  [place] arranged  [place].   Status of service:  Completed, signed off Medicare Important Message given?   (If response is "NO", the following Medicare IM given date fields will be blank) Date Medicare IM given:   Date Additional Medicare IM given:    Discharge Disposition:    Per UR Regulation:    If discussed at Long Length of Stay Meetings, dates discussed:    Comments:  09/22/13 Claretha Cooper RN BSN CM Pt moving into [facility] in [place]. per Daughter, pt will be in room 105.

## 2013-09-22 NOTE — ED Notes (Signed)
PTS CHILDREN  [facility] AND CHIP [phone] HOME [phone]

## 2013-09-22 NOTE — Progress Notes (Signed)
UR completed 

## 2013-09-22 NOTE — Progress Notes (Signed)
[facility] PROGRESS NOTE  Meghan Duran ATF:[id number] DOB: 1923-11-18 DOA: 09/21/2013 PCP: [name], MD  Assessment/Plan: #1 probable TIA/right-sided weakness  Patient had presented with right-sided weakness and numbness with some associated slurred speech that improved rapidly. Currently at baseline.  Patient was deemed not a TPA candidate secondary to rapid improvement of her symptoms. Patient does have multiple risk factors of hypertension hyperlipidemia and diabetes. CT of the head which was done was negative for any acute abnormalities. Await MRI/MRA of the head. Await carotid Dopplers and 2-D echo. PT/OT/SLP. Lipid panel within the limits of normal. Hemoglobin A1c penidng. Continue patient's statin and aspirin 325 mg daily for secondary stroke prevention. Evaluated by Dr. Olene Craven neurology who opines left hemispheric infarct likely small cortical event.   #2 urinary tract infection  Await urine cultures. continue ciprofloxacin day #2. Will change to po. She is afebrile and non-toxic appearing.  #3 acute renal failure   Improving this am. Likely secondary to prerenal azotemia. Await urine sodium and urine creatinine. Await renal ultrasound. Continue gentle IV fluids. Urinary output to be monitored.  #4 type 2 diabetes  Hemoglobin A1c pending. Hold oral hypoglycemic agents. Sliding scale insulin.  #5 depression/anxiety  Stable. Continue home regimen of BuSpar, Celexa.  #6 hypertension  Stable. Continue clonidine, Cozaar, Toprol-XL.  #7 hyperlipidemia  Fasting lipid panel within the limits of normal. Continue statin.  #8 gastroesophageal reflux disease  PPI.  #9 prophylaxis  PPI for GI prophylaxis. Lovenox  Code Status: full Family Communication: none present Disposition Plan: home hopefully tomorrow. Scheduled to [facility] into [facility] today.    Consultants:  neurology  Procedures:  Echo  Carotid doppler  Antibiotics:  cipro  09/21/13>>  HPI/Subjective: Sitting up eating breakfast. Reports feeling much better  Objective: Filed Vitals:   09/22/13 0700  BP: 144/72  Pulse: 74  Temp:   Resp:    No intake or output data in the 24 hours ending 09/22/13 0925 Filed Weights   09/21/13 2259 09/22/13 0255  Weight: 87.998 kg (194 lb) 87.544 kg (193 lb)    Exam:   General:  Obese NAD  Cardiovascular: RRR No MGR No LE edema  Respiratory: normal effort BS clear bilaterally to auscultation. No wheeze  Abdomen: obese soft +BS throughout non-tender to palpation no guarding  Musculoskeletal: no clubbing or cyanosis  Neuro: alert and oriented x 3. Speech clear. Facial symmetry. UE strength 5/5, LE strength 4/5 on left 5/5 on right.    Data Reviewed: Basic Metabolic Panel:  Recent Labs Lab 09/21/13 2326 09/21/13 2336 09/22/13 0535  NA 142 142 140  K 4.2 3.9 3.9  CL 103 105 103  CO2 25  --  26  GLUCOSE 91 85 180*  BUN 31* 28* 31*  CREATININE 1.43* 1.50* 1.39*  CALCIUM 9.4  --  8.2*   Liver Function Tests:  Recent Labs Lab 09/21/13 2326  AST 22  ALT 16  ALKPHOS 52  BILITOT 0.3  PROT 7.0  ALBUMIN 3.7   No results found for this basename: LIPASE, AMYLASE,  in the last 168 hours No results found for this basename: AMMONIA,  in the last 168 hours CBC:  Recent Labs Lab 09/21/13 2326 09/21/13 2336 09/22/13 0535  WBC 7.7  --  5.5  NEUTROABS 3.9  --   --   HGB 12.2 12.9 9.9*  HCT 37.1 38.0 29.5*  MCV 94.9  --  95.8  PLT 232  --  157   Cardiac Enzymes:  Recent Labs Lab  09/21/13 2326  TROPONINI <0.30   BNP (last 3 results) No results found for this basename: PROBNP,  in the last 8760 hours CBG:  Recent Labs Lab 09/22/13 0728  GLUCAP 122*    No results found for this or any previous visit (from the past 240 hour(s)).   Studies: Ct Head Wo Contrast  09/22/2013   CLINICAL DATA:  Right arm weakness.  Slurred speech.  EXAM: CT HEAD WITHOUT CONTRAST  TECHNIQUE: Contiguous axial  images were obtained from the base of the skull through the vertex without intravenous contrast.  COMPARISON:  Head CT 03/06/2010.  FINDINGS: Mild cerebral and cerebellar atrophy. Patchy and confluent areas of decreased attenuation are noted throughout the deep and periventricular white matter of the cerebral hemispheres bilaterally, compatible with chronic microvascular ischemic disease. No acute intracranial abnormalities. Specifically, no evidence of acute intracranial hemorrhage, no definite findings of acute/subacute cerebral ischemia, no mass, mass effect, hydrocephalus or abnormal intra or extra-axial fluid collections. Visualized paranasal sinuses and mastoids are well pneumatized, with exception of opacification of the right frontal sinus. No acute displaced skull fractures are identified.  IMPRESSION: 1. No acute intracranial abnormalities. 2. Mild cerebral and cerebellar atrophy with chronic microvascular ischemic changes in the cerebral white matter. 3. Opacification of the right frontal sinus. Although this may be chronic, clinical correlation for signs and symptoms of acute sinusitis is recommended.   Electronically Signed   By: Vinnie Langton M.D.   On: 09/22/2013 00:20    Scheduled Meds: . aspirin  300 mg Rectal Daily   Or  . aspirin  325 mg Oral Daily  . busPIRone  15 mg Oral BID  . ciprofloxacin  200 mg Intravenous Q12H  . citalopram  40 mg Oral Daily  . cloNIDine  0.2 mg Oral BID  . enoxaparin (LOVENOX) injection  30 mg Subcutaneous Q24H  . fluticasone  1 spray Each Nare Daily  . insulin aspart  0-9 Units Subcutaneous TID WC  . loratadine  10 mg Oral Daily  . losartan  100 mg Oral Daily  . metoprolol succinate  25 mg Oral Daily  . pantoprazole  40 mg Oral Daily  . pantoprazole  40 mg Oral Q0600  . simvastatin  20 mg Oral q1800   Continuous Infusions: . sodium chloride 75 mL/hr at 09/22/13 0448    Principal Problem:   TIA (transient ischemic attack) Active Problems:    Pernicious anemia   DM II (diabetes mellitus, type II), controlled   Renal insufficiency   Hyperlipemia   Diabetic neuropathy, type II diabetes mellitus   Right sided weakness   Depression   Anxiety   GERD (gastroesophageal reflux disease)   UTI (lower urinary tract infection)   ARF (acute renal failure)   HTN (hypertension)    Time spent: 30 minutes    [facility] Pager [phone]. If 7PM-7AM, please contact night-coverage at [email or web address], password [facility] 09/22/2013, 9:25 AM  LOS: 1 day

## 2013-09-22 NOTE — Progress Notes (Signed)
*  PRELIMINARY RESULTS* Echocardiogram 2D Echocardiogram has been performed.  [place], [place] 09/22/2013, 9:37 AM

## 2013-09-22 NOTE — Progress Notes (Signed)
I agree with the history of presenting illness as per Dr. Grandville Silos I have reviewed the database and concur with the exam by Mrs. Black   She will need further neck imaging to rule out definitively any type of blockage although I do not hear any bruit in may benefit ultimately from ultrasound to rule out nodular goiter.  We will call radiologist tomorrow to figure out which study [MRA/CTA] carries elast risk of contrast nephropathy and proceed with that test  We will continue IV fluids-FeNa=0.4% with ultrasound be normal therefore this is prerenal  Verneita Griffes, MD [facility] [phone]

## 2013-09-22 NOTE — Evaluation (Signed)
Occupational Therapy Evaluation Patient Details Name: Meghan Duran MRN: [id number] DOB: 12/26/1923 Today's Date: 09/22/2013 Time: 9833-8250 OT Time Calculation (min): 25 min Eval 25'  OT Assessment / Plan / Recommendation History of present illness Pt is admitted with right sided weakness and slurred speech.  Pt states that she just hasn't felt very well for the past few weeks.  She also reports some old left leg weakness and has difficulty placing the limb at times, trips over rugs, etc.  She was supposed to move into an independent living facility today and is planning to return there after d/c (and potentially a brif time at one of her children's homes.)   Clinical Impression   Pt is presenting to acute OT with above situation.  Pt indicates that she has feeling signficanly improved from prior to admission and is not currenlty feeling any of the deficits previously described.  She has some remaining RUE weakness, but pt is still at functional level.  Pt was at modified independent level with ADLs prior to admission, and with the resolution of her symptoms pt is currently presenting at same modified independent level.   Pt does not need further OT services at this time - pt to be d/c-ed from acute OT.    OT Assessment  Patient does not need any further OT services    Follow Up Recommendations  No OT follow up       Equipment Recommendations  None recommended by OT          Precautions / Restrictions Precautions Precautions: Fall Restrictions Weight Bearing Restrictions: No   Pertinent Vitals/Pain Pt denied any pain, but was stated being very fatigued from not sleeping upon admission last night.    ADL  Eating/Feeding: Independent Grooming: Wash/dry hands;Wash/dry face;Brushing hair;Independent Where Assessed - Grooming: Supported standing Lower Body Dressing: Modified independent Where Assessed - Lower Body Dressing: Unsupported sitting Toilet Transfer: Modified  independent Toilet Transfer Equipment: Regular height toilet Toileting - Clothing Manipulation and Hygiene: Independent Where Assessed - Best boy and Hygiene: Standing Equipment Used: Gait belt;Cane ADL Comments: Pt was independent with ADLs and receiving assist with IADLs prior to admission. Pt will be moving to assist living facility and will be continuing to receive IADL assist.  Pt is at modified independent level with all her ADLs and has no concerns about that at d/c - balance and walking are pt's areas of weakness during functional tasks.      OT Goals(Current goals can be found in the care plan section) Acute Rehab OT Goals Patient Stated Goal: to walk better OT Goal Formulation: With patient Time For Goal Achievement: 10/06/13 Potential to Achieve Goals: Good  Visit Information  Last OT Received On: 09/22/13 History of Present Illness: Pt is admitted with right sided weakness and slurred speech.  Pt states that she just hasn't felt very well for the past few weeks.  She also reports some old left leg weakness and has difficulty placing the limb at times, trips over rugs, etc.  She was supposed to move into an independent living facility today and is planning to return there after d/c (and potentially a brif time at one of her children's homes.)       Prior [place] expects to be discharged to:: Assisted living Type of Home: Assisted living (Lives in [facility] in [place] in summers) Home Equipment: [name] - single point;Walker - 2 wheels;Grab bars - toilet;Grab bars - tub/shower;Shower  seat - built in Prior Function Level of Independence: Independent with assistive device(s) Comments: uses a cane, but frequently reaches for furniture, counters with the other hand Communication Communication: No difficulties Dominant Hand: Right     Vision/Perception Vision - History Baseline Vision: Other (comment) (has  glassess for reading and distance, but does not wear all the time) Patient Visual Report: No change from baseline Vision - Assessment Eye Alignment: Within Functional Limits   Cognition  Cognition Arousal/Alertness: Awake/alert Behavior During Therapy: WFL for tasks assessed/performed Overall Cognitive Status: Within Functional Limits for tasks assessed    Extremity/Trunk Assessment Upper Extremity Assessment Upper Extremity Assessment: Generalized weakness;RUE deficits/detail RUE Deficits / Details: Decreased strength in right shoulder - grossly 4-/5.  Genralized weakness bilaterally otherwise, including grip strength. Pt demonstrated good functional use with weakness. RUE Sensation:  (Numbness has resolved.) Lower Extremity Assessment Lower Extremity Assessment: Defer to PT evaluation LLE Deficits / Details: weakness noted through the entire LE, generally 3/5     Mobility Bed Mobility Overal bed mobility: Modified Independent Transfers Overall transfer level: Modified independent Equipment used: Straight cane      Balance Balance Overall balance assessment: Modified Independent (used walls while walking and held onto counter during ADLs)   End of Session OT - End of Session Equipment Utilized During Treatment: Gait belt Activity Tolerance: Patient tolerated treatment well Patient left: in bed;with call bell/phone within reach;with bed alarm set  GO Functional Assessment Tool Used: Clinical Judgment Functional Limitation: Self care Self Care Current Status (C7893): At least 1 percent but less than 20 percent impaired, limited or restricted Self Care Goal Status (Y1017): At least 1 percent but less than 20 percent impaired, limited or restricted Self Care Discharge Status [phone]): At least 1 percent but less than 20 percent impaired, limited or restricted   Bea Graff, [place], OTR/L [phone]  09/22/2013, 4:15 PM

## 2013-09-23 ENCOUNTER — Observation Stay (HOSPITAL_COMMUNITY): Payer: Medicare Other

## 2013-09-23 DIAGNOSIS — I658 Occlusion and stenosis of other precerebral arteries: Secondary | ICD-10-CM | POA: Diagnosis not present

## 2013-09-23 DIAGNOSIS — R269 Unspecified abnormalities of gait and mobility: Secondary | ICD-10-CM | POA: Diagnosis not present

## 2013-09-23 DIAGNOSIS — I6340 Cerebral infarction due to embolism of unspecified cerebral artery: Secondary | ICD-10-CM | POA: Diagnosis not present

## 2013-09-23 DIAGNOSIS — G459 Transient cerebral ischemic attack, unspecified: Secondary | ICD-10-CM | POA: Diagnosis not present

## 2013-09-23 DIAGNOSIS — R259 Unspecified abnormal involuntary movements: Secondary | ICD-10-CM | POA: Diagnosis not present

## 2013-09-23 LAB — GLUCOSE, CAPILLARY
Glucose-Capillary: 139 mg/dL — ABNORMAL HIGH (ref 70–99)
Glucose-Capillary: 177 mg/dL — ABNORMAL HIGH (ref 70–99)

## 2013-09-23 LAB — CBC
HEMATOCRIT: 29.9 % — AB (ref 36.0–46.0)
Hemoglobin: 10.1 g/dL — ABNORMAL LOW (ref 12.0–15.0)
MCH: 32 pg (ref 26.0–34.0)
MCHC: 33.8 g/dL (ref 30.0–36.0)
MCV: 94.6 fL (ref 78.0–100.0)
Platelets: 140 10*3/uL — ABNORMAL LOW (ref 150–400)
RBC: 3.16 MIL/uL — ABNORMAL LOW (ref 3.87–5.11)
RDW: 13 % (ref 11.5–15.5)
WBC: 4.9 10*3/uL (ref 4.0–10.5)

## 2013-09-23 LAB — BASIC METABOLIC PANEL
BUN: 23 mg/dL (ref 6–23)
CHLORIDE: 109 meq/L (ref 96–112)
CO2: 25 mEq/L (ref 19–32)
CREATININE: 1.16 mg/dL — AB (ref 0.50–1.10)
Calcium: 8.1 mg/dL — ABNORMAL LOW (ref 8.4–10.5)
GFR, EST AFRICAN AMERICAN: 48 mL/min — AB (ref >90)
GFR, EST NON AFRICAN AMERICAN: 41 mL/min — AB (ref >90)
Glucose, Bld: 134 mg/dL — ABNORMAL HIGH (ref 70–99)
Potassium: 4.1 mEq/L (ref 3.7–5.3)
Sodium: 144 mEq/L (ref 137–147)

## 2013-09-23 MED ORDER — NABUMETONE 500 MG PO TABS: 500.0000 mg | ORAL_TABLET | Freq: Two times a day (BID) | ORAL | Status: DC | PRN

## 2013-09-23 MED ORDER — ASPIRIN EC 81 MG PO TBEC: 81.0000 mg | DELAYED_RELEASE_TABLET | Freq: Every day | ORAL | Status: DC

## 2013-09-23 MED ORDER — ASPIRIN 81 MG PO TBEC: 81.0000 mg | DELAYED_RELEASE_TABLET | Freq: Every day | ORAL | Status: DC

## 2013-09-23 MED ORDER — GADOBENATE DIMEGLUMINE 529 MG/ML IV SOLN
10.0000 mL | Freq: Once | INTRAVENOUS | Status: AC | PRN
  Administered 2013-09-23: 10 mL via INTRAVENOUS

## 2013-09-23 NOTE — Discharge Summary (Signed)
Physician Discharge Summary  NAVADA Duran SAY:[id number] DOB: [birth year] DOA: 09/21/2013  PCP: [name], MD  Admit date: 09/21/2013 Discharge date: 09/23/2013  Time spent: 40 minutes  Recommendations for Outpatient Follow-up:  1. Follow up with Dr [name] as scheduled for evaluation of TIA symptoms and symmetric tremors 2. PCP for follow up/evaluation medications as Buspar discontinued due to tremors.  May also want to consider ultra sound of neck to rule out thyroid etiology or malignancy of thyroid nodule given hx. Recommend BMET to track kidney function.  Discharge Diagnoses:  Principal Problem:   TIA (transient ischemic attack) Active Problems:   Pernicious anemia   DM II (diabetes mellitus, type II), controlled   Renal insufficiency   Hyperlipemia   Diabetic neuropathy, type II diabetes mellitus   Right sided weakness   Depression   Anxiety   GERD (gastroesophageal reflux disease)   UTI (lower urinary tract infection)   ARF (acute renal failure)   HTN (hypertension)   Thyroid nodule   Discharge Condition: stable  Diet recommendation: heart healthy  Filed Weights   09/21/13 2259 09/22/13 0255 09/23/13 0525  Weight: 87.998 kg (194 lb) 87.544 kg (193 lb) 88.225 kg (194 lb 8 oz)    History of present illness:  Meghan Duran is a 80 y.o. female  With history of hypertension, hyperlipidemia, diabetes mellitus, depression/anxiety who presented to the ED on 09/22/13 with sudden onset of right-sided weakness and numbness and slurred speech. Patient stated that she was getting ready for bed around 10 PM the night prior to admission when she noted sudden onset of right upper extremity weakness and numbness as well as slurred speech. Patient stated that she hadn't felt well throughout the day. Patient denied any visual abnormalities and no gait abnormalities. Patient presented immediately to the emergency room approximately within 30 minutes of her symptoms. Patient's  symptoms improved rapidly and as such she was deemed not a TPA candidate. Patient denied any fevers, no chills, no nausea, no vomiting, no chest pain, no shortness of breath, no dysuria, no diarrhea, no constipation. Patient stated recently got over a bout of diverticulitis.  Patient was in the emergency room CT of the head which was done was negative for any acute abnormalities. Basic metabolic profile done at a BUN of 31 creatinine of 1.43 otherwise was within normal limits. First set of troponin was negative. CBC was unremarkable. Urinalysis done was nitrite negative small leukocytes too numerous to count WBCs 3-6 RBCs many bacteria. EKG done showed a normal sinus rhythm.   Hospital Course:  #1 TIA  Patient had presented with right-sided weakness and numbness with some associated slurred speech that improved rapidly. Returned to baseline. Patient was deemed not a TPA candidate secondary to rapid improvement of her symptoms. Patient does have multiple risk factors of hypertension hyperlipidemia and diabetes. CT of the head which was done was negative for any acute abnormalities. MRI/MRA of the head negative for stoke.Carotid Dopplers yields  heterogeneous and calcified atherosclerotic plaque on the right  results in in estimated less than 50% diameter stenosis in the internal carotid artery. However, evaluation of the more proximal internal carotid artery is limited by shadowing artifact from the calcified plaque Flow. . Mild to moderate narrowing proximal left subclavian artery. 2-D echo yields 65% with grade 1 diastolic dysfunction.  PT/OT/SLP evaluated and recommended no OP therapy needed.  Lipid panel within the limits of normal.  Hemoglobin A1c 6.3.  Evaluated by Dr [name] neurology who recommended  Continuing statin  and aspirin 81 mg daily for secondary stroke prevention.  Patient will follow up with Dr. [name] in May for TIa/Tremors  #2 urinary tract infection  Urine cultures with 75000  staph aureus. Will discontinue ciprofloxacin. She remained afebrile and non-toxic appearing.  #3 acute renal failure  Likely secondary to prerenal azotemia. Urine sodium and urine creatinine within the limits of normal. Renal US was neg Continue gentle IV fluids. Urinary output to be monitored.  #4 type 2 diabetes  Hemoglobin A1c 6.3 resume home regimen  #5 depression/anxiety  Stable. Continue home regimen of Celexa. Buspar discontinued due to tremors--Will need OP follow up #6 hypertension  Stable. Continue clonidine, Cozaar, Toprol-XL.  #7 hyperlipidemia  Fasting lipid panel within the limits of normal. Continue statin.  #8 gastroesophageal reflux disease  PPI.  #9-prior h/o many yars ago of Tonsilar ca?--Recommned decdiacted US neck as OP per PCP    Procedures:  2 d echo as above  Carotid doppler as above  Consultations:  Dr. [name]  Discharge Exam: Filed Vitals:   09/23/13 1417  BP: 127/78  Pulse: 68  Temp: 97.3 F (36.3 C)  Resp: 20    General: well nourished NAD Cardiovascular: RRR No MGR No LE edema Respiratory: normal effort BS clear bilaterally no wheeze Neuro: alert oriented x3. Speech clear cranial nerve II-XII intact  Discharge Instructions       Future Appointments Provider Department Dept Phone   09/26/2013 1:10 PM [name], MD [place] [phone]       Medication List    STOP taking these medications       busPIRone 15 MG tablet  Commonly known as:  BUSPAR      TAKE these medications       aspirin 81 MG EC tablet  Take 1 tablet (81 mg total) by mouth daily.     cetirizine 10 MG tablet  Commonly known as:  ZYRTEC  Take 1 tablet (10 mg total) by mouth daily.     citalopram 40 MG tablet  Commonly known as:  CELEXA  TAKE ONE TABLET BY MOUTH ONCE DAILY     cloNIDine 0.2 MG tablet  Commonly known as:  CATAPRES  TAKE ONE TABLET BY MOUTH TWICE DAILY     fluticasone 50 MCG/ACT nasal spray  Commonly known as:   FLONASE  Place 1 spray into the nose daily.     glipiZIDE 5 MG tablet  Commonly known as:  GLUCOTROL  Take 0.5 tablets (2.5 mg total) by mouth 2 (two) times daily before a meal.     losartan 100 MG tablet  Commonly known as:  COZAAR  TAKE ONE TABLET BY MOUTH ONCE DAILY     meclizine 12.5 MG tablet  Commonly known as:  ANTIVERT  TAKE ONE TABLET BY MOUTH TWICE DAILY AS NEEDED     metFORMIN 500 MG tablet  Commonly known as:  GLUCOPHAGE  TAKE ONE TABLET BY MOUTH TWICE DAILY WITH MEALS     metoprolol succinate 25 MG 24 hr tablet  Commonly known as:  TOPROL-XL  TAKE ONE TABLET BY MOUTH ONCE DAILY     nabumetone 500 MG tablet  Commonly known as:  RELAFEN  Take 1 tablet (500 mg total) by mouth 2 (two) times daily as needed for moderate pain.     pantoprazole 40 MG tablet  Commonly known as:  PROTONIX  Take 1 tablet (40 mg total) by mouth daily.     pravastatin 40 MG tablet  Commonly  known as:  PRAVACHOL  Take 1 tablet (40 mg total) by mouth every evening.     TRUETRACK TEST test strip  Generic drug:  glucose blood  TEST ONCE A DAY       Allergies  Allergen Reactions  . Amoxil [Amoxicillin]   . Cymbalta [Duloxetine Hcl]     Side effects  . Macrobid [Nitrofurantoin Macrocrystal]     Diarrhea and swollen lips  . Tramadol     dizzy   Follow-up Information   Follow up with Phillips Odor, MD On 11/17/2013. (at 4:45 pm)    Specialty:  Neurology   Contact information:   [address] [place] [id number] [phone]       Follow up with [name], MD On 09/26/2013. (appointment at 1pm)    Specialty:  Family Medicine   Contact information:   [address] [place] [address] [phone]        The results of significant diagnostics from this hospitalization (including imaging, microbiology, ancillary and laboratory) are listed below for reference.    Significant Diagnostic Studies: Dg Chest 2 View  09/22/2013   CLINICAL DATA:  Stroke  symptoms  EXAM: CHEST  2 VIEW  COMPARISON:  DG CHEST 2 VIEW dated 03/06/2010  FINDINGS: Lungs are mildly hyperinflated and clear. There is no focal infiltrate. The cardiac silhouette is normal in size. The pulmonary vascularity is not engorged. The mediastinum is normal in width. There is calcification in the wall of the thoracic aorta. There is no pleural effusion or pneumothorax. There is mild stable apical pleural thickening bilaterally. There is thoracic spine degenerative disc change but no acute abnormalities.  IMPRESSION: There is no evidence of pneumonia, atelectasis, nor CHF. There is mild hyperinflation which may be voluntary or could reflect underlying COPD.   Electronically Signed   By: David  Martinique   On: 09/22/2013 12:14   Ct Head Wo Contrast  09/22/2013   CLINICAL DATA:  Right arm weakness.  Slurred speech.  EXAM: CT HEAD WITHOUT CONTRAST  TECHNIQUE: Contiguous axial images were obtained from the base of the skull through the vertex without intravenous contrast.  COMPARISON:  Head CT 03/06/2010.  FINDINGS: Mild cerebral and cerebellar atrophy. Patchy and confluent areas of decreased attenuation are noted throughout the deep and periventricular white matter of the cerebral hemispheres bilaterally, compatible with chronic microvascular ischemic disease. No acute intracranial abnormalities. Specifically, no evidence of acute intracranial hemorrhage, no definite findings of acute/subacute cerebral ischemia, no mass, mass effect, hydrocephalus or abnormal intra or extra-axial fluid collections. Visualized paranasal sinuses and mastoids are well pneumatized, with exception of opacification of the right frontal sinus. No acute displaced skull fractures are identified.  IMPRESSION: 1. No acute intracranial abnormalities. 2. Mild cerebral and cerebellar atrophy with chronic microvascular ischemic changes in the cerebral white matter. 3. Opacification of the right frontal sinus. Although this may be chronic,  clinical correlation for signs and symptoms of acute sinusitis is recommended.   Electronically Signed   By: Vinnie Langton M.D.   On: 09/22/2013 00:20   Mr Jodene Nam Head Wo Contrast  09/22/2013   CLINICAL DATA:  Suspected acute left hemisphere infarct versus TIA. Gait instability. Stroke risk factors include diabetes, and hypertension.  EXAM: MRA HEAD WITHOUT CONTRAST  TECHNIQUE: Angiographic images of the Circle of Willis were obtained using MRA technique without intravenous contrast.  COMPARISON:  MR HEAD W/O CM dated 09/22/2013; CT HEAD W/O CM dated 09/21/2013  FINDINGS: Mild non stenotic irregularity of  the vertical petrous carotid segments bilaterally without flow-limiting stenosis. Minimal non stenotic irregularity both cavernous carotids. ICA termini are widely patent.  No M1 MCA stenosis. Mild irregularity bilateral distal MCA branches consistent with intracranial atherosclerotic change.  Mild non stenotic irregularity left A1 ACA. Long segment narrowing distal right anterior cerebral.  No proximal PCA stenosis. Bilateral PCA P2 segment stenoses, potentially flow reducing.  Basilar artery widely patent. Vertebrals codominant. No intracranial aneurysm.  IMPRESSION: Intracranial atherosclerotic change as described. No proximal flow reducing lesion of the left ICA or left MCA.   Electronically Signed   By: Rolla Flatten M.D.   On: 09/22/2013 12:36   Mr Brain Wo Contrast  09/22/2013   CLINICAL DATA:  Suspected left hemisphere stroke. Transient right arm weakness and speech difficulty. Stroke risk factors include hypertension and diabetes.  EXAM: MRI HEAD WITHOUT CONTRAST  TECHNIQUE: Multiplanar, multiecho pulse sequences of the brain and surrounding structures were obtained without intravenous contrast.  COMPARISON:  CT HEAD W/O CM dated 09/21/2013  FINDINGS: No evidence for acute infarction, hemorrhage, mass lesion, hydrocephalus, or extra-axial fluid. Normal for age cerebral volume. Mild to moderate  subcortical and periventricular white matter signal abnormality, likely chronic microvascular ischemic change. Flow voids are maintained throughout the carotid, basilar, and vertebral arteries. There are no areas of chronic hemorrhage. Partial empty sella. No tonsillar herniation. Moderate pannus. Visualized calvarium, skull base, and upper cervical osseous structures unremarkable. Scalp and extracranial soft tissues, orbits, sinuses, and mastoids show no acute process.  IMPRESSION: No acute stroke or hemorrhage.  Mild to moderate small vessel disease.  Partial empty sella.   Electronically Signed   By: Rolla Flatten M.D.   On: 09/22/2013 12:38   US Renal  09/22/2013   CLINICAL DATA:  Acute renal failure.  EXAM: RENAL/URINARY TRACT ULTRASOUND COMPLETE  COMPARISON:  CT scan of Nov 28, 2009.  FINDINGS: Right Kidney:  Length: 10.5 cm. Echogenicity within normal limits. No mass or hydronephrosis visualized.  Left Kidney:  Length: 10.7 cm. Echogenicity within normal limits. No mass or hydronephrosis visualized.  Bladder:  Appears normal for degree of bladder distention.  IMPRESSION: Normal renal ultrasound.   Electronically Signed   By: Sabino Dick M.D.   On: 09/22/2013 14:24   US Carotid Duplex Bilateral  09/22/2013   CLINICAL DATA:  Transient ischemic attack, right arm numbness and slurred speech  EXAM: BILATERAL CAROTID DUPLEX ULTRASOUND  TECHNIQUE: Pearline Cables scale imaging, color Doppler and duplex ultrasound were performed of bilateral carotid and vertebral arteries in the neck.  COMPARISON:  MRI MRA brain 09/22/2013 ; prior thyroid ultrasound 03/24/2010  FINDINGS: Criteria: Quantification of carotid stenosis is based on velocity parameters that correlate the residual internal carotid diameter with NASCET-based stenosis levels, using the diameter of the distal internal carotid lumen as the denominator for stenosis measurement.  The following velocity measurements were obtained:  RIGHT  ICA:  102/26 cm/sec  CCA:   99991111 cm/sec  SYSTOLIC ICA/CCA RATIO:  1.7  DIASTOLIC ICA/CCA RATIO:  2.2  ECA:  185 cm/sec  LEFT  ICA:  87/24 cm/sec  CCA:  123456 cm/sec  SYSTOLIC ICA/CCA RATIO:  1.2  DIASTOLIC ICA/CCA RATIO:  2.3  ECA:  152 cm/sec  RIGHT CAROTID ARTERY: Intimal medial thickening. Additionally, there is heterogeneous and partially calcified plaque in the carotid bifurcation extending into the internal and external carotid arteries. There is a greater than twofold elevation at the origin of the external carotid artery suggesting a greater than 50% stenosis. Evaluation of the  proximal right internal carotid artery is limited secondary to shadowing artifact from the atherosclerotic plaque.  RIGHT VERTEBRAL ARTERY:  Patent with normal antegrade flow.  LEFT CAROTID ARTERY: Intimal medial thickening. Heterogeneous atherosclerotic plaque in the carotid bifurcation extending into the proximal internal and external carotid arteries. Greater than twofold elevation at the origin of the external carotid artery suggesting a greater than 50% diameter stenosis. Heterogeneous and partially calcified atherosclerotic plaque in the proximal internal carotid artery results in less than 50% diameter stenosis.  LEFT VERTEBRAL ARTERY:  Patent with normal antegrade flow.  IMPRESSION: 1. Heterogeneous and calcified atherosclerotic plaque on the right results in in estimated less than 50% diameter stenosis in the internal carotid artery. However, evaluation of the more proximal internal carotid artery is limited by shadowing artifact from the calcified plaque and a more high-grade stenosis is difficult to exclude sonographically. If clinically warranted, CTA or MRA of the neck could further evaluate. 2. Mild heterogeneous atherosclerotic plaque results in less than 50% stenosis of the left internal carotid artery. 3. Bilateral at least 50% diameter stenoses of the external carotid arteries noted incidentally. 4. Bilateral vertebral arteries are patent with  normal antegrade flow. 5. Incidentally in incompletely imaged heterogeneous unlikely nodular thyroid gland. Of note, thyroid nodules were previously interrogated by ultrasound in September of 2011. Consider further evaluation with dedicated thyroid ultrasound to provide a more accurate comparison to the prior imaging. Signed,  Sterling Big, MD  [facility]  [facility]   Electronically Signed   By: Malachy Moan M.D.   On: 09/22/2013 15:10    Microbiology: Recent Results (from the past 240 hour(s))  URINE CULTURE     Status: None   Collection Time    09/22/13 12:06 AM      Result Value Ref Range Status   Specimen Description URINE, CLEAN CATCH   Final   Special Requests NONE   Final   Culture  Setup Time     Final   Value: 09/22/2013 13:32     Performed at [facility] Count     Final   Value: 75,000 COLONIES/ML     Performed at [facility]   Culture     Final   Value: STAPHYLOCOCCUS AUREUS     Note: RIFAMPIN AND GENTAMICIN SHOULD NOT BE USED AS SINGLE DRUGS FOR TREATMENT OF STAPH INFECTIONS.     Performed at [facility]   Report Status PENDING   Incomplete     Labs: Basic Metabolic Panel:  Recent Labs Lab 09/21/13 2326 09/21/13 2336 09/22/13 0535 09/23/13 0614  NA 142 142 140 144  K 4.2 3.9 3.9 4.1  CL 103 105 103 109  CO2 25  --  26 25  GLUCOSE 91 85 180* 134*  BUN 31* 28* 31* 23  CREATININE 1.43* 1.50* 1.39* 1.16*  CALCIUM 9.4  --  8.2* 8.1*   Liver Function Tests:  Recent Labs Lab 09/21/13 2326  AST 22  ALT 16  ALKPHOS 52  BILITOT 0.3  PROT 7.0  ALBUMIN 3.7   No results found for this basename: LIPASE, AMYLASE,  in the last 168 hours No results found for this basename: AMMONIA,  in the last 168 hours CBC:  Recent Labs Lab 09/21/13 2326 09/21/13 2336 09/22/13 0535 09/23/13 0614  WBC 7.7  --  5.5 4.9  NEUTROABS 3.9  --   --   --   HGB 12.2 12.9 9.9* 10.1*  HCT 37.1 38.0 29.5* 29.9*  MCV 94.9  --  95.8 94.6  PLT 232  --  157 140*   Cardiac Enzymes:  Recent Labs Lab 09/21/13 2326  TROPONINI <0.30   BNP: BNP (last 3 results) No results found for this basename: PROBNP,  in the last 8760 hours CBG:  Recent Labs Lab 09/22/13 1153 09/22/13 1632 09/22/13 2039 09/23/13 0726 09/23/13 1136  GLUCAP 113* 129* 122* 139* 177*       Signed:  BLACK,KAREN M  [facility] 09/23/2013, 3:58 PM

## 2013-09-23 NOTE — Progress Notes (Signed)
IV removed. Discharge instructions reviewed with patient and son in law. Understanding verbalized. Ready for discharge home.

## 2013-09-23 NOTE — Progress Notes (Addendum)
Patient ID: Meghan Duran, female   DOB: [birth year], 80 y.o.   MRN: [id number]  [name], MD     [email or web address]          Meghan Duran is an 80 y.o. female.   Assessment/Plan: 1. left hemispheric TIA. Risk factors age, hypertension, diabetes and dyslipidemia. 81 mg aspirin and statin recommended. 2. Symmetric tremors of unclear etiology. This possibly could be due to a serotonergic effects from BuSpar and Celexa.This be observed for now.  3. Diaphoresis of unclear etiology.  4. Dizziness/gait instability and standing suggestive of orthostatic instability/autonomic disorder. Orthostatics obtained x 3 and fine.   Many complaints are reported today. She continues to be concerned about her tremors. Again, having present for about a month or so. Sure percent she has been on the antidepressant for about 2 months. Suspect this is the etiology. I will discontinue the BuSpar and see how she does. We have return to the office in 2 months. She's continue with aspirin 81 mg, all blood pressure control, diabetes control and statin.   GENERAL: Simply pleasant mildly obese lady in no acute distress.  HEENT: Supple. Atraumatic normocephalic. EXTREMITIES: No edema. There is significant arthritic changes of the knees and hands bilaterally.  SKIN: Normal by inspection.  MENTAL STATUS: Alert and oriented. Speech, language and cognition are generally intact. Judgment and insight normal.  CRANIAL NERVES: Pupils are equal, round and reactive to light and accommodation; extra ocular movements are full, there is no significant nystagmus; visual fields are full; upper and lower facial muscles are normal in strength and symmetric, there is no flattening of the nasolabial folds; tongue is midline; uvula is midline; shoulder elevation is normal.  MOTOR: There is mild weakness of deltoids bilaterally 4+/5. Triceps 5 and handgrip 5. Right hip flexion 5/5. Dorsiflexion 5/5. Left  lower extremity normal hip flexion and dorsiflexion.  COORDINATION: Left finger to nose is normal, right finger to nose is normal, No rest tremor; no intention tremor; no postural tremor; no bradykinesia.         Objective: Vital signs in last 24 hours: Temp:  [97.1 F (36.2 C)-97.5 F (36.4 C)] 97.5 F (36.4 C) (03/24 0525) Pulse Rate:  [69-86] 75 (03/24 0824) Resp:  [20] 20 (03/24 0525) BP: (116-150)/(43-80) 143/53 mmHg (03/24 0824) SpO2:  [100 %] 100 % (03/24 0525) Weight:  [88.225 kg (194 lb 8 oz)] 88.225 kg (194 lb 8 oz) (03/24 0525)  Intake/Output from previous day: 03/23 0701 - 03/24 0700 In: 1550 [P.O.:560; I.V.:990] Out: 1100 [Urine:1100] Intake/Output this shift:   Nutritional status: Carb Control   Lab Results: Results for orders placed during the hospital encounter of 09/21/13 (from the past 48 hour(s))  ETHANOL     Status: None   Collection Time    09/21/13 11:26 PM      Result Value Ref Range   Alcohol, Ethyl (B) <11  0 - 11 mg/dL   Comment:            LOWEST DETECTABLE LIMIT FOR     SERUM ALCOHOL IS 11 mg/dL     FOR MEDICAL PURPOSES ONLY  PROTIME-INR     Status: None   Collection Time    09/21/13 11:26 PM      Result Value Ref Range   Prothrombin Time 11.8  11.6 - 15.2 seconds   INR 0.88  0.00 - 1.49  APTT     Status: None   Collection Time  09/21/13 11:26 PM      Result Value Ref Range   aPTT 27  24 - 37 seconds  CBC     Status: None   Collection Time    09/21/13 11:26 PM      Result Value Ref Range   WBC 7.7  4.0 - 10.5 K/uL   RBC 3.91  3.87 - 5.11 MIL/uL   Hemoglobin 12.2  12.0 - 15.0 g/dL   HCT 37.1  36.0 - 46.0 %   MCV 94.9  78.0 - 100.0 fL   MCH 31.2  26.0 - 34.0 pg   MCHC 32.9  30.0 - 36.0 g/dL   RDW 13.0  11.5 - 15.5 %   Platelets 232  150 - 400 K/uL  DIFFERENTIAL     Status: None   Collection Time    09/21/13 11:26 PM      Result Value Ref Range   Neutrophils Relative % 51  43 - 77 %   Neutro Abs 3.9  1.7 - 7.7 K/uL    Lymphocytes Relative 32  12 - 46 %   Lymphs Abs 2.4  0.7 - 4.0 K/uL   Monocytes Relative 12  3 - 12 %   Monocytes Absolute 0.9  0.1 - 1.0 K/uL   Eosinophils Relative 5  0 - 5 %   Eosinophils Absolute 0.4  0.0 - 0.7 K/uL   Basophils Relative 1  0 - 1 %   Basophils Absolute 0.1  0.0 - 0.1 K/uL  COMPREHENSIVE METABOLIC PANEL     Status: Abnormal   Collection Time    09/21/13 11:26 PM      Result Value Ref Range   Sodium 142  137 - 147 mEq/L   Potassium 4.2  3.7 - 5.3 mEq/L   Chloride 103  96 - 112 mEq/L   CO2 25  19 - 32 mEq/L   Glucose, Bld 91  70 - 99 mg/dL   BUN 31 (*) 6 - 23 mg/dL   Creatinine, Ser 1.43 (*) 0.50 - 1.10 mg/dL   Calcium 9.4  8.4 - 10.5 mg/dL   Total Protein 7.0  6.0 - 8.3 g/dL   Albumin 3.7  3.5 - 5.2 g/dL   AST 22  0 - 37 U/L   ALT 16  0 - 35 U/L   Alkaline Phosphatase 52  39 - 117 U/L   Total Bilirubin 0.3  0.3 - 1.2 mg/dL   GFR calc non Af Amer 32 (*) >90 mL/min   GFR calc Af Amer 37 (*) >90 mL/min   Comment: (NOTE)     The eGFR has been calculated using the CKD EPI equation.     This calculation has not been validated in all clinical situations.     eGFR's persistently <90 mL/min signify possible Chronic Kidney     Disease.  TROPONIN I     Status: None   Collection Time    09/21/13 11:26 PM      Result Value Ref Range   Troponin I <0.30  <0.30 ng/mL   Comment:            Due to the release kinetics of cTnI,     a negative result within the first hours     of the onset of symptoms does not rule out     myocardial infarction with certainty.     If myocardial infarction is still suspected,     repeat the test at appropriate intervals.  HEMOGLOBIN A1C  Status: Abnormal   Collection Time    09/21/13 11:26 PM      Result Value Ref Range   Hemoglobin A1C 6.3 (*) <5.7 %   Comment: (NOTE)                                                                               According to the ADA Clinical Practice Recommendations for 2011, when     HbA1c is  used as a screening test:      >=6.5%   Diagnostic of Diabetes Mellitus               (if abnormal result is confirmed)     5.7-6.4%   Increased risk of developing Diabetes Mellitus     References:Diagnosis and Classification of Diabetes Mellitus,Diabetes     NIOE,7035,00(XFGHW 1):S62-S69 and Standards of Medical Care in             Diabetes - 2011,Diabetes EXHB,7169,67 (Suppl 1):S11-S61.   Mean Plasma Glucose 134 (*) <117 mg/dL   Comment: Performed at [facility] [facility], ED     Status: Abnormal   Collection Time    09/21/13 11:36 PM      Result Value Ref Range   Sodium 142  137 - 147 mEq/L   Potassium 3.9  3.7 - 5.3 mEq/L   Chloride 105  96 - 112 mEq/L   BUN 28 (*) 6 - 23 mg/dL   Creatinine, Ser 1.50 (*) 0.50 - 1.10 mg/dL   Glucose, Bld 85  70 - 99 mg/dL   Calcium, Ion 1.17  1.13 - 1.30 mmol/L   TCO2 23  0 - 100 mmol/L   Hemoglobin 12.9  12.0 - 15.0 g/dL   HCT 38.0  36.0 - 46.0 %  URINE RAPID DRUG SCREEN (HOSP PERFORMED)     Status: Abnormal   Collection Time    09/22/13 12:06 AM      Result Value Ref Range   Opiates NONE DETECTED  NONE DETECTED   Cocaine NONE DETECTED  NONE DETECTED   Benzodiazepines NONE DETECTED  NONE DETECTED   Amphetamines POSITIVE (*) NONE DETECTED   Tetrahydrocannabinol NONE DETECTED  NONE DETECTED   Barbiturates NONE DETECTED  NONE DETECTED   Comment:            DRUG SCREEN FOR MEDICAL PURPOSES     ONLY.  IF CONFIRMATION IS NEEDED     FOR ANY PURPOSE, NOTIFY LAB     WITHIN 5 DAYS.                LOWEST DETECTABLE LIMITS     FOR URINE DRUG SCREEN     Drug Class       Cutoff (ng/mL)     Amphetamine      1000     Barbiturate      200     Benzodiazepine   893     Tricyclics       810     Opiates          300     Cocaine          300     THC  38  URINALYSIS, ROUTINE W REFLEX MICROSCOPIC     Status: Abnormal   Collection Time    09/22/13 12:06 AM      Result Value Ref Range   Color, Urine YELLOW  YELLOW    APPearance CLEAR  CLEAR   Specific Gravity, Urine >1.030 (*) 1.005 - 1.030   pH 5.5  5.0 - 8.0   Glucose, UA NEGATIVE  NEGATIVE mg/dL   Hgb urine dipstick TRACE (*) NEGATIVE   Bilirubin Urine SMALL (*) NEGATIVE   Ketones, ur NEGATIVE  NEGATIVE mg/dL   Protein, ur NEGATIVE  NEGATIVE mg/dL   Urobilinogen, UA 0.2  0.0 - 1.0 mg/dL   Nitrite NEGATIVE  NEGATIVE   Leukocytes, UA SMALL (*) NEGATIVE  URINE MICROSCOPIC-ADD ON     Status: Abnormal   Collection Time    09/22/13 12:06 AM      Result Value Ref Range   Squamous Epithelial / LPF FEW (*) RARE   WBC, UA TOO NUMEROUS TO COUNT  <3 WBC/hpf   RBC / HPF 3-6  <3 RBC/hpf   Bacteria, UA MANY (*) RARE  HEMOGLOBIN A1C     Status: Abnormal   Collection Time    09/22/13  5:35 AM      Result Value Ref Range   Hemoglobin A1C 6.3 (*) <5.7 %   Comment: (NOTE)                                                                               According to the ADA Clinical Practice Recommendations for 2011, when     HbA1c is used as a screening test:      >=6.5%   Diagnostic of Diabetes Mellitus               (if abnormal result is confirmed)     5.7-6.4%   Increased risk of developing Diabetes Mellitus     References:Diagnosis and Classification of Diabetes Mellitus,Diabetes     ZSMO,7078,67(JQGBE 1):S62-S69 and Standards of Medical Care in             Diabetes - 2011,Diabetes Care,2011,34 (Suppl 1):S11-S61.   Mean Plasma Glucose 134 (*) <117 mg/dL   Comment: Performed at [place]: None   Collection Time    09/22/13  5:35 AM      Result Value Ref Range   Cholesterol 123  0 - 200 mg/dL   Triglycerides 90  <150 mg/dL   HDL 49  >39 mg/dL   Total CHOL/HDL Ratio 2.5     VLDL 18  0 - 40 mg/dL   LDL Cholesterol 56  0 - 99 mg/dL   Comment:            Total Cholesterol/HDL:CHD Risk     Coronary Heart Disease Risk Table                         Men   Women      1/2 Average Risk   3.4   3.3      Average Risk        5.0   4.4  2 X Average Risk   9.6   7.1      3 X Average Risk  23.4   11.0                Use the calculated Patient Ratio     above and the CHD Risk Table     to determine the patient's CHD Risk.                ATP III CLASSIFICATION (LDL):      <100     mg/dL   Optimal      100-129  mg/dL   Near or Above                        Optimal      130-159  mg/dL   Borderline      160-189  mg/dL   High      >190     mg/dL   Very High  CBC     Status: Abnormal   Collection Time    09/22/13  5:35 AM      Result Value Ref Range   WBC 5.5  4.0 - 10.5 K/uL   RBC 3.08 (*) 3.87 - 5.11 MIL/uL   Hemoglobin 9.9 (*) 12.0 - 15.0 g/dL   Comment: DELTA CHECK NOTED     RESULT REPEATED AND VERIFIED   HCT 29.5 (*) 36.0 - 46.0 %   MCV 95.8  78.0 - 100.0 fL   MCH 32.1  26.0 - 34.0 pg   MCHC 33.6  30.0 - 36.0 g/dL   RDW 13.1  11.5 - 15.5 %   Platelets 157  150 - 400 K/uL   Comment: DELTA CHECK NOTED  BASIC METABOLIC PANEL     Status: Abnormal   Collection Time    09/22/13  5:35 AM      Result Value Ref Range   Sodium 140  137 - 147 mEq/L   Potassium 3.9  3.7 - 5.3 mEq/L   Chloride 103  96 - 112 mEq/L   CO2 26  19 - 32 mEq/L   Glucose, Bld 180 (*) 70 - 99 mg/dL   BUN 31 (*) 6 - 23 mg/dL   Creatinine, Ser 1.39 (*) 0.50 - 1.10 mg/dL   Calcium 8.2 (*) 8.4 - 10.5 mg/dL   GFR calc non Af Amer 33 (*) >90 mL/min   GFR calc Af Amer 38 (*) >90 mL/min   Comment: (NOTE)     The eGFR has been calculated using the CKD EPI equation.     This calculation has not been validated in all clinical situations.     eGFR's persistently <90 mL/min signify possible Chronic Kidney     Disease.  GLUCOSE, CAPILLARY     Status: Abnormal   Collection Time    09/22/13  7:28 AM      Result Value Ref Range   Glucose-Capillary 122 (*) 70 - 99 mg/dL   Comment 1 Notify RN    SODIUM, URINE, RANDOM     Status: None   Collection Time    09/22/13 10:01 AM      Result Value Ref Range   Sodium, Ur 56    CREATININE,  URINE, RANDOM     Status: None   Collection Time    09/22/13 10:01 AM      Result Value Ref Range   Creatinine, Urine 133.43    GLUCOSE, CAPILLARY  Status: Abnormal   Collection Time    09/22/13 11:53 AM      Result Value Ref Range   Glucose-Capillary 113 (*) 70 - 99 mg/dL   Comment 1 Notify RN    GLUCOSE, CAPILLARY     Status: Abnormal   Collection Time    09/22/13  4:32 PM      Result Value Ref Range   Glucose-Capillary 129 (*) 70 - 99 mg/dL   Comment 1 Notify RN     Comment 2 Documented in Chart    GLUCOSE, CAPILLARY     Status: Abnormal   Collection Time    09/22/13  8:39 PM      Result Value Ref Range   Glucose-Capillary 122 (*) 70 - 99 mg/dL   Comment 1 Notify RN     Comment 2 Documented in Chart    BASIC METABOLIC PANEL     Status: Abnormal   Collection Time    09/23/13  6:14 AM      Result Value Ref Range   Sodium 144  137 - 147 mEq/L   Potassium 4.1  3.7 - 5.3 mEq/L   Chloride 109  96 - 112 mEq/L   CO2 25  19 - 32 mEq/L   Glucose, Bld 134 (*) 70 - 99 mg/dL   BUN 23  6 - 23 mg/dL   Creatinine, Ser 1.16 (*) 0.50 - 1.10 mg/dL   Calcium 8.1 (*) 8.4 - 10.5 mg/dL   GFR calc non Af Amer 41 (*) >90 mL/min   GFR calc Af Amer 48 (*) >90 mL/min   Comment: (NOTE)     The eGFR has been calculated using the CKD EPI equation.     This calculation has not been validated in all clinical situations.     eGFR's persistently <90 mL/min signify possible Chronic Kidney     Disease.  CBC     Status: Abnormal   Collection Time    09/23/13  6:14 AM      Result Value Ref Range   WBC 4.9  4.0 - 10.5 K/uL   RBC 3.16 (*) 3.87 - 5.11 MIL/uL   Hemoglobin 10.1 (*) 12.0 - 15.0 g/dL   HCT 29.9 (*) 36.0 - 46.0 %   MCV 94.6  78.0 - 100.0 fL   MCH 32.0  26.0 - 34.0 pg   MCHC 33.8  30.0 - 36.0 g/dL   RDW 13.0  11.5 - 15.5 %   Platelets 140 (*) 150 - 400 K/uL  GLUCOSE, CAPILLARY     Status: Abnormal   Collection Time    09/23/13  7:26 AM      Result Value Ref Range    Glucose-Capillary 139 (*) 70 - 99 mg/dL   Comment 1 Notify RN      Lipid Panel  Recent Labs  09/22/13 0535  CHOL 123  TRIG 90  HDL 49  CHOLHDL 2.5  VLDL 18  LDLCALC 56    Studies/Results: Dg Chest 2 View  09/22/2013   CLINICAL DATA:  Stroke symptoms  EXAM: CHEST  2 VIEW  COMPARISON:  DG CHEST 2 VIEW dated 03/06/2010  FINDINGS: Lungs are mildly hyperinflated and clear. There is no focal infiltrate. The cardiac silhouette is normal in size. The pulmonary vascularity is not engorged. The mediastinum is normal in width. There is calcification in the wall of the thoracic aorta. There is no pleural effusion or pneumothorax. There is mild stable apical pleural thickening bilaterally. There is thoracic spine  degenerative disc change but no acute abnormalities.  IMPRESSION: There is no evidence of pneumonia, atelectasis, nor CHF. There is mild hyperinflation which may be voluntary or could reflect underlying COPD.   Electronically Signed   By: David  Martinique   On: 09/22/2013 12:14   Ct Head Wo Contrast  09/22/2013   CLINICAL DATA:  Right arm weakness.  Slurred speech.  EXAM: CT HEAD WITHOUT CONTRAST  TECHNIQUE: Contiguous axial images were obtained from the base of the skull through the vertex without intravenous contrast.  COMPARISON:  Head CT 03/06/2010.  FINDINGS: Mild cerebral and cerebellar atrophy. Patchy and confluent areas of decreased attenuation are noted throughout the deep and periventricular white matter of the cerebral hemispheres bilaterally, compatible with chronic microvascular ischemic disease. No acute intracranial abnormalities. Specifically, no evidence of acute intracranial hemorrhage, no definite findings of acute/subacute cerebral ischemia, no mass, mass effect, hydrocephalus or abnormal intra or extra-axial fluid collections. Visualized paranasal sinuses and mastoids are well pneumatized, with exception of opacification of the right frontal sinus. No acute displaced skull fractures  are identified.  IMPRESSION: 1. No acute intracranial abnormalities. 2. Mild cerebral and cerebellar atrophy with chronic microvascular ischemic changes in the cerebral white matter. 3. Opacification of the right frontal sinus. Although this may be chronic, clinical correlation for signs and symptoms of acute sinusitis is recommended.   Electronically Signed   By: Vinnie Langton M.D.   On: 09/22/2013 00:20   Mr Jodene Nam Head Wo Contrast  09/22/2013   CLINICAL DATA:  Suspected acute left hemisphere infarct versus TIA. Gait instability. Stroke risk factors include diabetes, and hypertension.  EXAM: MRA HEAD WITHOUT CONTRAST  TECHNIQUE: Angiographic images of the Circle of Willis were obtained using MRA technique without intravenous contrast.  COMPARISON:  MR HEAD W/O CM dated 09/22/2013; CT HEAD W/O CM dated 09/21/2013  FINDINGS: Mild non stenotic irregularity of the vertical petrous carotid segments bilaterally without flow-limiting stenosis. Minimal non stenotic irregularity both cavernous carotids. ICA termini are widely patent.  No M1 MCA stenosis. Mild irregularity bilateral distal MCA branches consistent with intracranial atherosclerotic change.  Mild non stenotic irregularity left A1 ACA. Long segment narrowing distal right anterior cerebral.  No proximal PCA stenosis. Bilateral PCA P2 segment stenoses, potentially flow reducing.  Basilar artery widely patent. Vertebrals codominant. No intracranial aneurysm.  IMPRESSION: Intracranial atherosclerotic change as described. No proximal flow reducing lesion of the left ICA or left MCA.   Electronically Signed   By: Rolla Flatten M.D.   On: 09/22/2013 12:36   Mr Brain Wo Contrast  09/22/2013   CLINICAL DATA:  Suspected left hemisphere stroke. Transient right arm weakness and speech difficulty. Stroke risk factors include hypertension and diabetes.  EXAM: MRI HEAD WITHOUT CONTRAST  TECHNIQUE: Multiplanar, multiecho pulse sequences of the brain and surrounding  structures were obtained without intravenous contrast.  COMPARISON:  CT HEAD W/O CM dated 09/21/2013  FINDINGS: No evidence for acute infarction, hemorrhage, mass lesion, hydrocephalus, or extra-axial fluid. Normal for age cerebral volume. Mild to moderate subcortical and periventricular white matter signal abnormality, likely chronic microvascular ischemic change. Flow voids are maintained throughout the carotid, basilar, and vertebral arteries. There are no areas of chronic hemorrhage. Partial empty sella. No tonsillar herniation. Moderate pannus. Visualized calvarium, skull base, and upper cervical osseous structures unremarkable. Scalp and extracranial soft tissues, orbits, sinuses, and mastoids show no acute process.  IMPRESSION: No acute stroke or hemorrhage.  Mild to moderate small vessel disease.  Partial empty sella.   Electronically  Signed   By: Rolla Flatten M.D.   On: 09/22/2013 12:38   US Renal  09/22/2013   CLINICAL DATA:  Acute renal failure.  EXAM: RENAL/URINARY TRACT ULTRASOUND COMPLETE  COMPARISON:  CT scan of Nov 28, 2009.  FINDINGS: Right Kidney:  Length: 10.5 cm. Echogenicity within normal limits. No mass or hydronephrosis visualized.  Left Kidney:  Length: 10.7 cm. Echogenicity within normal limits. No mass or hydronephrosis visualized.  Bladder:  Appears normal for degree of bladder distention.  IMPRESSION: Normal renal ultrasound.   Electronically Signed   By: Sabino Dick M.D.   On: 09/22/2013 14:24   US Carotid Duplex Bilateral  09/22/2013   CLINICAL DATA:  Transient ischemic attack, right arm numbness and slurred speech  EXAM: BILATERAL CAROTID DUPLEX ULTRASOUND  TECHNIQUE: Pearline Cables scale imaging, color Doppler and duplex ultrasound were performed of bilateral carotid and vertebral arteries in the neck.  COMPARISON:  MRI MRA brain 09/22/2013 ; prior thyroid ultrasound 03/24/2010  FINDINGS: Criteria: Quantification of carotid stenosis is based on velocity parameters that correlate the  residual internal carotid diameter with NASCET-based stenosis levels, using the diameter of the distal internal carotid lumen as the denominator for stenosis measurement.  The following velocity measurements were obtained:  RIGHT  ICA:  102/26 cm/sec  CCA:  69/62 cm/sec  SYSTOLIC ICA/CCA RATIO:  1.7  DIASTOLIC ICA/CCA RATIO:  2.2  ECA:  185 cm/sec  LEFT  ICA:  87/24 cm/sec  CCA:  95/28 cm/sec  SYSTOLIC ICA/CCA RATIO:  1.2  DIASTOLIC ICA/CCA RATIO:  2.3  ECA:  152 cm/sec  RIGHT CAROTID ARTERY: Intimal medial thickening. Additionally, there is heterogeneous and partially calcified plaque in the carotid bifurcation extending into the internal and external carotid arteries. There is a greater than twofold elevation at the origin of the external carotid artery suggesting a greater than 50% stenosis. Evaluation of the proximal right internal carotid artery is limited secondary to shadowing artifact from the atherosclerotic plaque.  RIGHT VERTEBRAL ARTERY:  Patent with normal antegrade flow.  LEFT CAROTID ARTERY: Intimal medial thickening. Heterogeneous atherosclerotic plaque in the carotid bifurcation extending into the proximal internal and external carotid arteries. Greater than twofold elevation at the origin of the external carotid artery suggesting a greater than 50% diameter stenosis. Heterogeneous and partially calcified atherosclerotic plaque in the proximal internal carotid artery results in less than 50% diameter stenosis.  LEFT VERTEBRAL ARTERY:  Patent with normal antegrade flow.  IMPRESSION: 1. Heterogeneous and calcified atherosclerotic plaque on the right results in in estimated less than 50% diameter stenosis in the internal carotid artery. However, evaluation of the more proximal internal carotid artery is limited by shadowing artifact from the calcified plaque and a more high-grade stenosis is difficult to exclude sonographically. If clinically warranted, CTA or MRA of the neck could further evaluate. 2.  Mild heterogeneous atherosclerotic plaque results in less than 50% stenosis of the left internal carotid artery. 3. Bilateral at least 50% diameter stenoses of the external carotid arteries noted incidentally. 4. Bilateral vertebral arteries are patent with normal antegrade flow. 5. Incidentally in incompletely imaged heterogeneous unlikely nodular thyroid gland. Of note, thyroid nodules were previously interrogated by ultrasound in September of 2011. Consider further evaluation with dedicated thyroid ultrasound to provide a more accurate comparison to the prior imaging. Signed,  Criselda Peaches, MD  [facility]  [facility]   Electronically Signed   By: Jacqulynn Cadet M.D.   On: 09/22/2013 15:10    ECHO -  Left ventricle: The cavity size was normal. Systolic function was vigorous. The estimated ejection fraction was in the range of 65% to 70%. Mild posterior wall and asymmetric moderate basal septal hypertrophy. Papillary muscle calcification noted. Wall motion was normal; there were no regional wall motion abnormalities. There was an increased relative contribution of atrial contraction to ventricular filling. Doppler parameters are consistent with abnormal left ventricular relaxation (grade 1 diastolic dysfunction). Doppler parameters are consistent with elevated ventricular end-diastolic filling pressure. - Aortic valve: Mildly calcified annulus. Trileaflet. There was no stenosis. - Mitral valve: Calcified annulus. Mildly thickened leaflets . Mild regurgitation. - Left atrium: The atrium was moderately dilated. Transthoracic echocardiography. M-mode, complete 2D, spectral Doppler, and color Doppler. Height: Height: 165.1cm. Height: 65in. Weight: Weight: 87.5kg. Weight: 192.6lb. Body mass index: BMI: 32.1kg/m^2. Body surface area: BSA: 1.41m2. Blood pressure: 130/52. Patient status: Inpatient. Location: Bedside.   Medications:    Scheduled Meds: . aspirin  300 mg Rectal Daily   Or  . aspirin  325 mg Oral Daily  . busPIRone  15 mg Oral BID  . ciprofloxacin  250 mg Oral BID  . citalopram  40 mg Oral Daily  . cloNIDine  0.2 mg Oral BID  . enoxaparin (LOVENOX) injection  40 mg Subcutaneous Q24H  . fluticasone  1 spray Each Nare Daily  . insulin aspart  0-9 Units Subcutaneous TID WC  . loratadine  10 mg Oral Daily  . losartan  100 mg Oral Daily  . metoprolol succinate  25 mg Oral Daily  . pantoprazole  40 mg Oral Daily  . pantoprazole  40 mg Oral Q0600  . simvastatin  20 mg Oral q1800   Continuous Infusions: . sodium chloride 75 mL/hr at 09/22/13 0944   PRN Meds:.acetaminophen, acetaminophen, hyoscyamine, meclizine, nabumetone, senna-docusate     LOS: 2 days   Chalise Pe A. DMerlene Laughter M.D.  Diplomate, ATax adviserof Psychiatry and Neurology ( Neurology).

## 2013-09-24 DIAGNOSIS — F329 Major depressive disorder, single episode, unspecified: Secondary | ICD-10-CM | POA: Diagnosis not present

## 2013-09-24 DIAGNOSIS — E1149 Type 2 diabetes mellitus with other diabetic neurological complication: Secondary | ICD-10-CM | POA: Diagnosis not present

## 2013-09-24 DIAGNOSIS — I10 Essential (primary) hypertension: Secondary | ICD-10-CM | POA: Diagnosis not present

## 2013-09-24 DIAGNOSIS — F3289 Other specified depressive episodes: Secondary | ICD-10-CM | POA: Diagnosis not present

## 2013-09-24 DIAGNOSIS — E1142 Type 2 diabetes mellitus with diabetic polyneuropathy: Secondary | ICD-10-CM | POA: Diagnosis not present

## 2013-09-24 DIAGNOSIS — M19019 Primary osteoarthritis, unspecified shoulder: Secondary | ICD-10-CM | POA: Diagnosis not present

## 2013-09-24 DIAGNOSIS — Z87440 Personal history of urinary (tract) infections: Secondary | ICD-10-CM | POA: Diagnosis not present

## 2013-09-24 DIAGNOSIS — F411 Generalized anxiety disorder: Secondary | ICD-10-CM | POA: Diagnosis not present

## 2013-09-24 DIAGNOSIS — Z8673 Personal history of transient ischemic attack (TIA), and cerebral infarction without residual deficits: Secondary | ICD-10-CM | POA: Diagnosis not present

## 2013-09-24 DIAGNOSIS — IMO0001 Reserved for inherently not codable concepts without codable children: Secondary | ICD-10-CM | POA: Diagnosis not present

## 2013-09-24 LAB — URINE CULTURE: Colony Count: 75000

## 2013-09-26 ENCOUNTER — Encounter: Payer: Self-pay | Admitting: Family Medicine

## 2013-09-26 ENCOUNTER — Ambulatory Visit (INDEPENDENT_AMBULATORY_CARE_PROVIDER_SITE_OTHER): Payer: Medicare Other | Admitting: Family Medicine

## 2013-09-26 VITALS — BP 118/68 | Ht 65.0 in | Wt 201.0 lb

## 2013-09-26 DIAGNOSIS — G459 Transient cerebral ischemic attack, unspecified: Secondary | ICD-10-CM | POA: Diagnosis not present

## 2013-09-26 DIAGNOSIS — M899 Disorder of bone, unspecified: Secondary | ICD-10-CM

## 2013-09-26 DIAGNOSIS — N289 Disorder of kidney and ureter, unspecified: Secondary | ICD-10-CM

## 2013-09-26 DIAGNOSIS — M949 Disorder of cartilage, unspecified: Secondary | ICD-10-CM

## 2013-09-26 DIAGNOSIS — M8580 Other specified disorders of bone density and structure, unspecified site: Secondary | ICD-10-CM

## 2013-09-26 DIAGNOSIS — K219 Gastro-esophageal reflux disease without esophagitis: Secondary | ICD-10-CM

## 2013-09-26 DIAGNOSIS — E041 Nontoxic single thyroid nodule: Secondary | ICD-10-CM

## 2013-09-26 DIAGNOSIS — I10 Essential (primary) hypertension: Secondary | ICD-10-CM

## 2013-09-26 MED ORDER — LORATADINE 10 MG PO TABS: 10.0000 mg | ORAL_TABLET | Freq: Every day | ORAL | Status: DC

## 2013-09-26 MED ORDER — SULFAMETHOXAZOLE-TMP DS 800-160 MG PO TABS: 1.0000 | ORAL_TABLET | Freq: Two times a day (BID) | ORAL | Status: DC

## 2013-09-26 NOTE — Addendum Note (Signed)
Addended by: [name] A on: 09/26/2013 05:38 PM   Modules accepted: Orders

## 2013-09-26 NOTE — Progress Notes (Signed)
   Subjective:    Patient ID: Meghan Duran, female    DOB: Apr 23, 1924, 80 y.o.   MRN: [id number]  HPI  Patient arrives for a follow up from the hospital for TIA. Patient also reports she had UTI and requested a re check. Sample was spun down for microcopic exam there was not enough sample for a dipstick. She does relate having some symptoms of a TIA-like event but that actually got much better while in the hospital and they released her  They told her that there was some issues with her calcium and her thyroid   Review of Systems She denies any unilateral numbness weakness denies headache chest pain shortness of breath    Objective:   Physical Exam Lungs clear heart regular pulse normal extremities no edema skin warm dry neurologic grossly normal can get up out of a chair and walk with a cane       Assessment & Plan:  #1 UTI-bacteria shows MRSA it is best to treat with Bactrim DS twice a day for the next 7 days repeat urinalysis when patient follows up in a few weeks  #2 pernicious anemia followup in a couple weeks for B12 shot  #3 threatened stroke-we will do aggressive risk management of her risk factors. Patient will followup here on a regular basis we'll monitor blood pressure also monitor her cholesterol. And diabetes control. This was talked to at length. Continue current medications.  #4 history thyroid nodule ultrasound of the thyroid gland is scheduled  History of hypocalcemia on recent hospitalization check metabolic 7  #3 TIA threatened stroke-referral to neurology.  #4 thyroid nodules-to do a thyroid ultrasound  #5 hypocalcemia repeat metabolic 7 in approximately 2 weeks

## 2013-09-27 DIAGNOSIS — IMO0001 Reserved for inherently not codable concepts without codable children: Secondary | ICD-10-CM | POA: Diagnosis not present

## 2013-09-27 DIAGNOSIS — E1149 Type 2 diabetes mellitus with other diabetic neurological complication: Secondary | ICD-10-CM | POA: Diagnosis not present

## 2013-09-27 DIAGNOSIS — M19019 Primary osteoarthritis, unspecified shoulder: Secondary | ICD-10-CM | POA: Diagnosis not present

## 2013-09-27 DIAGNOSIS — I10 Essential (primary) hypertension: Secondary | ICD-10-CM | POA: Diagnosis not present

## 2013-09-27 DIAGNOSIS — E1142 Type 2 diabetes mellitus with diabetic polyneuropathy: Secondary | ICD-10-CM | POA: Diagnosis not present

## 2013-09-27 DIAGNOSIS — Z8673 Personal history of transient ischemic attack (TIA), and cerebral infarction without residual deficits: Secondary | ICD-10-CM | POA: Diagnosis not present

## 2013-09-30 DIAGNOSIS — E1142 Type 2 diabetes mellitus with diabetic polyneuropathy: Secondary | ICD-10-CM | POA: Diagnosis not present

## 2013-09-30 DIAGNOSIS — I10 Essential (primary) hypertension: Secondary | ICD-10-CM | POA: Diagnosis not present

## 2013-09-30 DIAGNOSIS — E1149 Type 2 diabetes mellitus with other diabetic neurological complication: Secondary | ICD-10-CM | POA: Diagnosis not present

## 2013-09-30 DIAGNOSIS — IMO0001 Reserved for inherently not codable concepts without codable children: Secondary | ICD-10-CM | POA: Diagnosis not present

## 2013-09-30 DIAGNOSIS — Z8673 Personal history of transient ischemic attack (TIA), and cerebral infarction without residual deficits: Secondary | ICD-10-CM | POA: Diagnosis not present

## 2013-09-30 DIAGNOSIS — M19019 Primary osteoarthritis, unspecified shoulder: Secondary | ICD-10-CM | POA: Diagnosis not present

## 2013-10-01 DIAGNOSIS — E1142 Type 2 diabetes mellitus with diabetic polyneuropathy: Secondary | ICD-10-CM | POA: Diagnosis not present

## 2013-10-01 DIAGNOSIS — IMO0001 Reserved for inherently not codable concepts without codable children: Secondary | ICD-10-CM | POA: Diagnosis not present

## 2013-10-01 DIAGNOSIS — I10 Essential (primary) hypertension: Secondary | ICD-10-CM | POA: Diagnosis not present

## 2013-10-01 DIAGNOSIS — M19019 Primary osteoarthritis, unspecified shoulder: Secondary | ICD-10-CM | POA: Diagnosis not present

## 2013-10-01 DIAGNOSIS — E1149 Type 2 diabetes mellitus with other diabetic neurological complication: Secondary | ICD-10-CM | POA: Diagnosis not present

## 2013-10-01 DIAGNOSIS — Z8673 Personal history of transient ischemic attack (TIA), and cerebral infarction without residual deficits: Secondary | ICD-10-CM | POA: Diagnosis not present

## 2013-10-02 ENCOUNTER — Ambulatory Visit (HOSPITAL_COMMUNITY)
Admission: RE | Admit: 2013-10-02 | Discharge: 2013-10-02 | Disposition: A | Discharge status: Home / Self Care | Payer: Medicare Other | Source: Ambulatory Visit | Attending: Family Medicine | Admitting: Family Medicine

## 2013-10-02 ENCOUNTER — Ambulatory Visit (HOSPITAL_COMMUNITY): Payer: Medicare Other

## 2013-10-02 DIAGNOSIS — E042 Nontoxic multinodular goiter: Secondary | ICD-10-CM | POA: Diagnosis not present

## 2013-10-02 DIAGNOSIS — E041 Nontoxic single thyroid nodule: Secondary | ICD-10-CM | POA: Insufficient documentation

## 2013-10-03 DIAGNOSIS — E1142 Type 2 diabetes mellitus with diabetic polyneuropathy: Secondary | ICD-10-CM | POA: Diagnosis not present

## 2013-10-03 DIAGNOSIS — I10 Essential (primary) hypertension: Secondary | ICD-10-CM | POA: Diagnosis not present

## 2013-10-03 DIAGNOSIS — Z8673 Personal history of transient ischemic attack (TIA), and cerebral infarction without residual deficits: Secondary | ICD-10-CM | POA: Diagnosis not present

## 2013-10-03 DIAGNOSIS — IMO0001 Reserved for inherently not codable concepts without codable children: Secondary | ICD-10-CM | POA: Diagnosis not present

## 2013-10-03 DIAGNOSIS — E1149 Type 2 diabetes mellitus with other diabetic neurological complication: Secondary | ICD-10-CM | POA: Diagnosis not present

## 2013-10-03 DIAGNOSIS — M19019 Primary osteoarthritis, unspecified shoulder: Secondary | ICD-10-CM | POA: Diagnosis not present

## 2013-10-03 NOTE — Progress Notes (Signed)
Patient's daughter notified and verbalized understanding of the test results. No further questions.

## 2013-10-06 DIAGNOSIS — E1149 Type 2 diabetes mellitus with other diabetic neurological complication: Secondary | ICD-10-CM | POA: Diagnosis not present

## 2013-10-06 DIAGNOSIS — Z8673 Personal history of transient ischemic attack (TIA), and cerebral infarction without residual deficits: Secondary | ICD-10-CM | POA: Diagnosis not present

## 2013-10-06 DIAGNOSIS — E1142 Type 2 diabetes mellitus with diabetic polyneuropathy: Secondary | ICD-10-CM

## 2013-10-07 DIAGNOSIS — IMO0001 Reserved for inherently not codable concepts without codable children: Secondary | ICD-10-CM | POA: Diagnosis not present

## 2013-10-07 DIAGNOSIS — E1149 Type 2 diabetes mellitus with other diabetic neurological complication: Secondary | ICD-10-CM | POA: Diagnosis not present

## 2013-10-07 DIAGNOSIS — E1142 Type 2 diabetes mellitus with diabetic polyneuropathy: Secondary | ICD-10-CM | POA: Diagnosis not present

## 2013-10-07 DIAGNOSIS — M19019 Primary osteoarthritis, unspecified shoulder: Secondary | ICD-10-CM | POA: Diagnosis not present

## 2013-10-07 DIAGNOSIS — I10 Essential (primary) hypertension: Secondary | ICD-10-CM | POA: Diagnosis not present

## 2013-10-07 DIAGNOSIS — Z8673 Personal history of transient ischemic attack (TIA), and cerebral infarction without residual deficits: Secondary | ICD-10-CM | POA: Diagnosis not present

## 2013-10-09 ENCOUNTER — Telehealth: Payer: Self-pay | Admitting: Family Medicine

## 2013-10-09 DIAGNOSIS — Z8673 Personal history of transient ischemic attack (TIA), and cerebral infarction without residual deficits: Secondary | ICD-10-CM | POA: Diagnosis not present

## 2013-10-09 DIAGNOSIS — E1149 Type 2 diabetes mellitus with other diabetic neurological complication: Secondary | ICD-10-CM | POA: Diagnosis not present

## 2013-10-09 DIAGNOSIS — I10 Essential (primary) hypertension: Secondary | ICD-10-CM | POA: Diagnosis not present

## 2013-10-09 DIAGNOSIS — IMO0001 Reserved for inherently not codable concepts without codable children: Secondary | ICD-10-CM | POA: Diagnosis not present

## 2013-10-09 DIAGNOSIS — M19019 Primary osteoarthritis, unspecified shoulder: Secondary | ICD-10-CM | POA: Diagnosis not present

## 2013-10-09 DIAGNOSIS — E1142 Type 2 diabetes mellitus with diabetic polyneuropathy: Secondary | ICD-10-CM | POA: Diagnosis not present

## 2013-10-09 MED ORDER — CIPROFLOXACIN HCL 250 MG PO TABS: 250.0000 mg | ORAL_TABLET | Freq: Two times a day (BID) | ORAL | Status: DC

## 2013-10-09 NOTE — Addendum Note (Signed)
Addended by: Jesusita Oka on: 10/09/2013 10:14 AM   Modules accepted: Orders

## 2013-10-09 NOTE — Telephone Encounter (Signed)
cipro 250 , one bid for 5days, f/u if ongoing

## 2013-10-09 NOTE — Telephone Encounter (Signed)
Patient was seen in ER on 09/21/13 and diagnosed with a UTI, and was given a sulfur based drug to help her get over it. She is now experiencing the lower back pain and frequency of urination and her daughter, [name], is hoping we can call something in since this is a problem she was already seen in ER for. I told her she may need an appointment. Please advise.  [facility]

## 2013-10-09 NOTE — Telephone Encounter (Signed)
Left message on voicemail notifying patient that medication was sent to pharmacy.  

## 2013-10-13 ENCOUNTER — Other Ambulatory Visit: Payer: Self-pay | Admitting: Family Medicine

## 2013-10-13 NOTE — Telephone Encounter (Signed)
This med was "discontinued by the provider" on 09/26/13

## 2013-10-14 ENCOUNTER — Other Ambulatory Visit: Payer: Self-pay | Admitting: Family Medicine

## 2013-10-15 ENCOUNTER — Encounter: Payer: Self-pay | Admitting: Family Medicine

## 2013-10-15 ENCOUNTER — Ambulatory Visit (HOSPITAL_COMMUNITY)
Admission: RE | Admit: 2013-10-15 | Discharge: 2013-10-15 | Disposition: A | Discharge status: Home / Self Care | Payer: Medicare Other | Source: Ambulatory Visit | Attending: Family Medicine | Admitting: Family Medicine

## 2013-10-15 ENCOUNTER — Ambulatory Visit (INDEPENDENT_AMBULATORY_CARE_PROVIDER_SITE_OTHER): Payer: Medicare Other | Admitting: Family Medicine

## 2013-10-15 VITALS — BP 100/70 | Temp 97.8°F | Ht 65.0 in | Wt 192.1 lb

## 2013-10-15 DIAGNOSIS — E041 Nontoxic single thyroid nodule: Secondary | ICD-10-CM | POA: Diagnosis not present

## 2013-10-15 DIAGNOSIS — M899 Disorder of bone, unspecified: Secondary | ICD-10-CM | POA: Insufficient documentation

## 2013-10-15 DIAGNOSIS — Z87440 Personal history of urinary (tract) infections: Secondary | ICD-10-CM | POA: Diagnosis not present

## 2013-10-15 DIAGNOSIS — D510 Vitamin B12 deficiency anemia due to intrinsic factor deficiency: Secondary | ICD-10-CM | POA: Diagnosis not present

## 2013-10-15 DIAGNOSIS — Z13820 Encounter for screening for osteoporosis: Secondary | ICD-10-CM | POA: Diagnosis not present

## 2013-10-15 DIAGNOSIS — M949 Disorder of cartilage, unspecified: Principal | ICD-10-CM

## 2013-10-15 DIAGNOSIS — I10 Essential (primary) hypertension: Secondary | ICD-10-CM | POA: Diagnosis not present

## 2013-10-15 LAB — POCT URINALYSIS DIPSTICK
Blood, UA: 10
PH UA: 5
Protein, UA: 100
Spec Grav, UA: 1.02

## 2013-10-15 MED ORDER — CYANOCOBALAMIN 1000 MCG/ML IJ SOLN
1000.0000 ug | Freq: Once | INTRAMUSCULAR | Status: AC
  Administered 2013-10-15: 1000 ug via INTRAMUSCULAR

## 2013-10-15 MED ORDER — SULFAMETHOXAZOLE-TMP DS 800-160 MG PO TABS: 1.0000 | ORAL_TABLET | Freq: Two times a day (BID) | ORAL | Status: AC

## 2013-10-15 NOTE — Progress Notes (Signed)
   Subjective:    Patient ID: Meghan Duran, female    DOB: 05-21-1924, 80 y.o.   MRN: [id number]  HPI Patient is here today for a follow up on her UTI. Patient has completed her antibiotics. Patient complains of some back pain and urinary frequency at nighttime. Patient also complains of unsteady gait and leg weakness that has been present for a while now. Wants to discuss starting patient back with physical therapy weekly.   She does relate at times she gets a little weak in the leg. She wonders if she would start back on physical therapy. I am open to this but I don't think it's necessary right at the moment weakness it comes after standing up and walking right after meal times I think the best approach would be continued exercise but minimize walking right after a meal because of potential for hypotension   Review of Systems No fevers sweats chills dysuria.    Objective:   Physical Exam  Lungs clear Heart regular extremities no edema skin warm dry with him blood pressures sitting and standing good slight drop with standing     Assessment & Plan:  Postprandial dizziness when she stands this is related to postprandial hypotension she is to avoid extensive walking within 30 minutes of eating  UTI still present the culture did show methicillin-resistant so therefore we will use Bactrim DS twice a day for the next 10 days daughter will bring a urine specimen from the assisted living place in approximately 10-14 days for [place] to look at  B12 immunization today.  Recheck in one month. I don't feel patient needs physical therapy just yet but certainly if she worsens and she would like it that seems reasonable for gait training and strengthening

## 2013-10-16 DIAGNOSIS — L570 Actinic keratosis: Secondary | ICD-10-CM | POA: Diagnosis not present

## 2013-10-16 DIAGNOSIS — L821 Other seborrheic keratosis: Secondary | ICD-10-CM | POA: Diagnosis not present

## 2013-10-16 DIAGNOSIS — Z85828 Personal history of other malignant neoplasm of skin: Secondary | ICD-10-CM | POA: Diagnosis not present

## 2013-10-16 DIAGNOSIS — IMO0002 Reserved for concepts with insufficient information to code with codable children: Secondary | ICD-10-CM | POA: Diagnosis not present

## 2013-10-16 LAB — BASIC METABOLIC PANEL
BUN: 21 mg/dL (ref 6–23)
CHLORIDE: 103 meq/L (ref 96–112)
CO2: 29 meq/L (ref 19–32)
CREATININE: 1.34 mg/dL — AB (ref 0.50–1.10)
Calcium: 9 mg/dL (ref 8.4–10.5)
Glucose, Bld: 78 mg/dL (ref 70–99)
Potassium: 4.5 mEq/L (ref 3.5–5.3)
Sodium: 140 mEq/L (ref 135–145)

## 2013-10-16 LAB — T4, FREE: Free T4: 1.5 ng/dL (ref 0.80–1.80)

## 2013-10-16 LAB — TSH: TSH: 2.864 u[IU]/mL (ref 0.350–4.500)

## 2013-10-17 DIAGNOSIS — I10 Essential (primary) hypertension: Secondary | ICD-10-CM | POA: Diagnosis not present

## 2013-10-17 DIAGNOSIS — IMO0001 Reserved for inherently not codable concepts without codable children: Secondary | ICD-10-CM | POA: Diagnosis not present

## 2013-10-17 DIAGNOSIS — E1142 Type 2 diabetes mellitus with diabetic polyneuropathy: Secondary | ICD-10-CM | POA: Diagnosis not present

## 2013-10-17 DIAGNOSIS — Z8673 Personal history of transient ischemic attack (TIA), and cerebral infarction without residual deficits: Secondary | ICD-10-CM | POA: Diagnosis not present

## 2013-10-17 DIAGNOSIS — E1149 Type 2 diabetes mellitus with other diabetic neurological complication: Secondary | ICD-10-CM | POA: Diagnosis not present

## 2013-10-17 DIAGNOSIS — M19019 Primary osteoarthritis, unspecified shoulder: Secondary | ICD-10-CM | POA: Diagnosis not present

## 2013-10-17 NOTE — Addendum Note (Signed)
Addended by: Dairl Ponder on: 10/17/2013 03:19 PM   Modules accepted: Orders

## 2013-10-20 ENCOUNTER — Telehealth: Payer: Self-pay | Admitting: Family Medicine

## 2013-10-20 DIAGNOSIS — Z8673 Personal history of transient ischemic attack (TIA), and cerebral infarction without residual deficits: Secondary | ICD-10-CM | POA: Diagnosis not present

## 2013-10-20 DIAGNOSIS — I10 Essential (primary) hypertension: Secondary | ICD-10-CM | POA: Diagnosis not present

## 2013-10-20 DIAGNOSIS — IMO0001 Reserved for inherently not codable concepts without codable children: Secondary | ICD-10-CM | POA: Diagnosis not present

## 2013-10-20 DIAGNOSIS — M19019 Primary osteoarthritis, unspecified shoulder: Secondary | ICD-10-CM | POA: Diagnosis not present

## 2013-10-20 DIAGNOSIS — E1149 Type 2 diabetes mellitus with other diabetic neurological complication: Secondary | ICD-10-CM | POA: Diagnosis not present

## 2013-10-20 DIAGNOSIS — E1142 Type 2 diabetes mellitus with diabetic polyneuropathy: Secondary | ICD-10-CM | POA: Diagnosis not present

## 2013-10-20 NOTE — Telephone Encounter (Signed)
Noted (since pt fine no follow up needed)

## 2013-10-20 NOTE — Telephone Encounter (Signed)
Pt fell at [facility] on Saturday after supper around 8pm  She is fine, states her bottom is a little sore but nothing more.   You may call them if you have any questions   Talk to [employer] or Katharine Look

## 2013-10-23 DIAGNOSIS — M19019 Primary osteoarthritis, unspecified shoulder: Secondary | ICD-10-CM | POA: Diagnosis not present

## 2013-10-23 DIAGNOSIS — IMO0001 Reserved for inherently not codable concepts without codable children: Secondary | ICD-10-CM | POA: Diagnosis not present

## 2013-10-23 DIAGNOSIS — Z8673 Personal history of transient ischemic attack (TIA), and cerebral infarction without residual deficits: Secondary | ICD-10-CM | POA: Diagnosis not present

## 2013-10-23 DIAGNOSIS — E1149 Type 2 diabetes mellitus with other diabetic neurological complication: Secondary | ICD-10-CM | POA: Diagnosis not present

## 2013-10-23 DIAGNOSIS — E1142 Type 2 diabetes mellitus with diabetic polyneuropathy: Secondary | ICD-10-CM | POA: Diagnosis not present

## 2013-10-23 DIAGNOSIS — I10 Essential (primary) hypertension: Secondary | ICD-10-CM | POA: Diagnosis not present

## 2013-10-28 ENCOUNTER — Encounter: Payer: Self-pay | Admitting: Family Medicine

## 2013-10-28 ENCOUNTER — Ambulatory Visit (INDEPENDENT_AMBULATORY_CARE_PROVIDER_SITE_OTHER): Payer: Medicare Other | Admitting: Family Medicine

## 2013-10-28 VITALS — BP 118/62 | Ht 65.0 in | Wt 192.0 lb

## 2013-10-28 DIAGNOSIS — I10 Essential (primary) hypertension: Secondary | ICD-10-CM | POA: Diagnosis not present

## 2013-10-28 DIAGNOSIS — R300 Dysuria: Secondary | ICD-10-CM | POA: Diagnosis not present

## 2013-10-28 DIAGNOSIS — N390 Urinary tract infection, site not specified: Secondary | ICD-10-CM

## 2013-10-28 DIAGNOSIS — M533 Sacrococcygeal disorders, not elsewhere classified: Secondary | ICD-10-CM

## 2013-10-28 DIAGNOSIS — IMO0001 Reserved for inherently not codable concepts without codable children: Secondary | ICD-10-CM | POA: Diagnosis not present

## 2013-10-28 DIAGNOSIS — Z8673 Personal history of transient ischemic attack (TIA), and cerebral infarction without residual deficits: Secondary | ICD-10-CM | POA: Diagnosis not present

## 2013-10-28 DIAGNOSIS — M19019 Primary osteoarthritis, unspecified shoulder: Secondary | ICD-10-CM | POA: Diagnosis not present

## 2013-10-28 DIAGNOSIS — E1142 Type 2 diabetes mellitus with diabetic polyneuropathy: Secondary | ICD-10-CM | POA: Diagnosis not present

## 2013-10-28 DIAGNOSIS — E1149 Type 2 diabetes mellitus with other diabetic neurological complication: Secondary | ICD-10-CM | POA: Diagnosis not present

## 2013-10-28 LAB — POCT URINALYSIS DIPSTICK: pH, UA: 5

## 2013-10-28 MED ORDER — HYDROCODONE-ACETAMINOPHEN 5-325 MG PO TABS: ORAL_TABLET | ORAL | Status: DC

## 2013-10-28 NOTE — Progress Notes (Signed)
   Subjective:    Patient ID: Meghan Duran, female    DOB: Nov 03, 1923, 80 y.o.   MRN: [id number]  HPIFall on April 18th. Having coccyx pain. Also bump head. Called EMS. They did not go to ED. she relates coccyx pain from falling otherwise he feels he is doing okay  Recheck urine. No dysuria, no abd pain, no back pain. Having frequent urination at night. Has taken 3 antibiotics.       Review of Systems She relates tailbone pain denies abdominal pain denies vomiting diarrhea sweats chills    Objective:   Physical Exam Lungs clear hearts regular pulse normal extremities no edema subjective discomfort in the lower coccyx when I pushed on it       Assessment & Plan:  1. Dysuria We will go ahead and culture urine. - POCT urinalysis dipstick  2. UTI (lower urinary tract infection) This patient has had problems with MRSA infections hopefully urine culture come back looking good  3. Coccydynia Patient did fall and injured her tailbone this should gradually get better hydrocodone when necessary cautioned drowsiness  Patient is RE scheduling followup regarding B12 and regular checkup

## 2013-10-29 ENCOUNTER — Other Ambulatory Visit: Payer: Self-pay | Admitting: *Deleted

## 2013-10-29 DIAGNOSIS — R300 Dysuria: Secondary | ICD-10-CM

## 2013-10-29 NOTE — Progress Notes (Signed)
Patient's daughter contacted and she verbalized understanding. She said she would take her mother on Monday to the lab. Order is in.

## 2013-10-30 DIAGNOSIS — IMO0001 Reserved for inherently not codable concepts without codable children: Secondary | ICD-10-CM | POA: Diagnosis not present

## 2013-10-30 DIAGNOSIS — M19019 Primary osteoarthritis, unspecified shoulder: Secondary | ICD-10-CM | POA: Diagnosis not present

## 2013-10-30 DIAGNOSIS — I10 Essential (primary) hypertension: Secondary | ICD-10-CM | POA: Diagnosis not present

## 2013-10-30 DIAGNOSIS — E1149 Type 2 diabetes mellitus with other diabetic neurological complication: Secondary | ICD-10-CM | POA: Diagnosis not present

## 2013-10-30 DIAGNOSIS — Z8673 Personal history of transient ischemic attack (TIA), and cerebral infarction without residual deficits: Secondary | ICD-10-CM | POA: Diagnosis not present

## 2013-10-30 DIAGNOSIS — E1142 Type 2 diabetes mellitus with diabetic polyneuropathy: Secondary | ICD-10-CM | POA: Diagnosis not present

## 2013-10-30 LAB — URINE CULTURE
COLONY COUNT: NO GROWTH
ORGANISM ID, BACTERIA: NO GROWTH

## 2013-11-04 DIAGNOSIS — IMO0001 Reserved for inherently not codable concepts without codable children: Secondary | ICD-10-CM | POA: Diagnosis not present

## 2013-11-04 DIAGNOSIS — Z8673 Personal history of transient ischemic attack (TIA), and cerebral infarction without residual deficits: Secondary | ICD-10-CM | POA: Diagnosis not present

## 2013-11-04 DIAGNOSIS — M19019 Primary osteoarthritis, unspecified shoulder: Secondary | ICD-10-CM | POA: Diagnosis not present

## 2013-11-04 DIAGNOSIS — E1142 Type 2 diabetes mellitus with diabetic polyneuropathy: Secondary | ICD-10-CM | POA: Diagnosis not present

## 2013-11-04 DIAGNOSIS — I10 Essential (primary) hypertension: Secondary | ICD-10-CM | POA: Diagnosis not present

## 2013-11-04 DIAGNOSIS — E1149 Type 2 diabetes mellitus with other diabetic neurological complication: Secondary | ICD-10-CM | POA: Diagnosis not present

## 2013-11-05 ENCOUNTER — Ambulatory Visit: Payer: Medicare Other | Admitting: Neurology

## 2013-11-06 DIAGNOSIS — M19019 Primary osteoarthritis, unspecified shoulder: Secondary | ICD-10-CM | POA: Diagnosis not present

## 2013-11-06 DIAGNOSIS — I10 Essential (primary) hypertension: Secondary | ICD-10-CM | POA: Diagnosis not present

## 2013-11-06 DIAGNOSIS — IMO0001 Reserved for inherently not codable concepts without codable children: Secondary | ICD-10-CM | POA: Diagnosis not present

## 2013-11-06 DIAGNOSIS — Z8673 Personal history of transient ischemic attack (TIA), and cerebral infarction without residual deficits: Secondary | ICD-10-CM | POA: Diagnosis not present

## 2013-11-06 DIAGNOSIS — E1142 Type 2 diabetes mellitus with diabetic polyneuropathy: Secondary | ICD-10-CM | POA: Diagnosis not present

## 2013-11-06 DIAGNOSIS — E1149 Type 2 diabetes mellitus with other diabetic neurological complication: Secondary | ICD-10-CM | POA: Diagnosis not present

## 2013-11-11 ENCOUNTER — Other Ambulatory Visit: Payer: Self-pay | Admitting: Family Medicine

## 2013-11-11 ENCOUNTER — Ambulatory Visit (INDEPENDENT_AMBULATORY_CARE_PROVIDER_SITE_OTHER): Payer: Medicare Other | Admitting: Family Medicine

## 2013-11-11 ENCOUNTER — Encounter: Payer: Self-pay | Admitting: Family Medicine

## 2013-11-11 VITALS — BP 110/60 | Ht 65.0 in | Wt 192.1 lb

## 2013-11-11 DIAGNOSIS — D510 Vitamin B12 deficiency anemia due to intrinsic factor deficiency: Secondary | ICD-10-CM | POA: Diagnosis not present

## 2013-11-11 DIAGNOSIS — E119 Type 2 diabetes mellitus without complications: Secondary | ICD-10-CM

## 2013-11-11 DIAGNOSIS — R109 Unspecified abdominal pain: Secondary | ICD-10-CM

## 2013-11-11 DIAGNOSIS — D649 Anemia, unspecified: Secondary | ICD-10-CM

## 2013-11-11 LAB — POCT URINALYSIS DIPSTICK
Spec Grav, UA: 1.02
pH, UA: 5

## 2013-11-11 MED ORDER — CIPROFLOXACIN HCL 500 MG PO TABS: 500.0000 mg | ORAL_TABLET | Freq: Two times a day (BID) | ORAL | Status: DC

## 2013-11-11 MED ORDER — METRONIDAZOLE 500 MG PO TABS: 500.0000 mg | ORAL_TABLET | Freq: Three times a day (TID) | ORAL | Status: DC

## 2013-11-11 MED ORDER — METFORMIN HCL 500 MG PO TABS: ORAL_TABLET | ORAL | Status: DC

## 2013-11-11 MED ORDER — CYANOCOBALAMIN 1000 MCG/ML IJ SOLN
1000.0000 ug | Freq: Once | INTRAMUSCULAR | Status: AC
  Administered 2013-11-11: 1000 ug via INTRAMUSCULAR

## 2013-11-11 NOTE — Progress Notes (Signed)
   Subjective:    Patient ID: Meghan Duran, female    DOB: 12-04-1923, 80 y.o.   MRN: [id number]  Diabetes She presents for her follow-up diabetic visit. She has type 2 diabetes mellitus. Her disease course has been stable. There are no hypoglycemic associated symptoms. Pertinent negatives for hypoglycemia include no confusion. There are no diabetic associated symptoms. Pertinent negatives for diabetes include no chest pain, no fatigue, no polydipsia, no polyphagia and no weakness. There are no hypoglycemic complications. Symptoms are stable. There are no diabetic complications. There are no known risk factors for coronary artery disease. Current diabetic treatment includes oral agent (monotherapy). She is compliant with treatment all of the time.  Last A1C is in the system on 09/22/13. Patient states that glucose has been running in the 130- 140's lately.   Patient states she thinks she is having a diverticulitis flare up due to recent abdominal pain. She relates pain in the left lower abdomen. She states is where she often gets pain with diverticula. She states it's been sore there is been no fever no vomiting she denies any change in appetite  She does relate urinary frequency and some dysuria. She has a history of UTI. No flank pain. No fever or chills.    Review of Systems  Constitutional: Negative for fever, activity change, appetite change and fatigue.  HENT: Negative for congestion.   Respiratory: Negative for cough and chest tightness.   Cardiovascular: Negative for chest pain.  Gastrointestinal: Positive for abdominal pain. Negative for nausea, vomiting, diarrhea and blood in stool.  Endocrine: Negative for polydipsia and polyphagia.  Genitourinary: Positive for dysuria and urgency. Negative for frequency.  Neurological: Negative for weakness.  Psychiatric/Behavioral: Negative for confusion.       Objective:   Physical Exam  Vitals reviewed. Constitutional: She appears  well-nourished. No distress.  Cardiovascular: Normal rate, regular rhythm and normal heart sounds.   No murmur heard. Pulmonary/Chest: Effort normal and breath sounds normal. No respiratory distress.  Abdominal: Soft. She exhibits no distension. There is tenderness. There is no rebound and no guarding.  Musculoskeletal: She exhibits no edema.  Lymphadenopathy:    She has no cervical adenopathy.  Neurological: She is alert. She exhibits normal muscle tone.  Psychiatric: Her behavior is normal.          Assessment & Plan:  #1 diverticulitis-this is a possibility antibiotics Cipro twice a day for 10 days Flagyl 3 times a day for 7 days no need for a CT scan or lab work. If worse over the next week call back and we will progress forward with further testing  #2 persistent UTI culture urine encourage liquids encourage complete emptying of bladder as much as possible.  #3 B12 deficiency B12 shot today  #4 diabetes increase metformin to 1-1/2 in the evening and a recheck A1c in a couple months time patient followup in one month

## 2013-11-14 ENCOUNTER — Ambulatory Visit: Payer: Medicare Other | Admitting: Family Medicine

## 2013-11-14 ENCOUNTER — Telehealth: Payer: Self-pay | Admitting: Family Medicine

## 2013-11-14 LAB — URINE CULTURE

## 2013-11-14 NOTE — Telephone Encounter (Signed)
Patient says that the antibiotic she is taking is really messing her stomach up. She would like to know what she needs to do or if we can try her on something else?   [place]

## 2013-11-14 NOTE — Telephone Encounter (Signed)
Pt was seen on 5/12 and given Cipro and Flagyl for diverticulitis. She said she is now vomiting whenever she takes those 2 antibiotics, even with a full stomach.

## 2013-11-14 NOTE — Telephone Encounter (Signed)
D/c meds, see if issue sttles down, follow up next week if further issues, go to er if worse

## 2013-11-27 MED ORDER — SULFAMETHOXAZOLE-TMP DS 800-160 MG PO TABS: 1.0000 | ORAL_TABLET | Freq: Two times a day (BID) | ORAL | Status: DC

## 2013-11-27 NOTE — Telephone Encounter (Addendum)
Daughter stated that patient is doing fine-got better

## 2013-11-27 NOTE — Addendum Note (Signed)
Addended by: Dairl Ponder on: 11/27/2013 09:10 AM   Modules accepted: Orders

## 2013-12-01 ENCOUNTER — Other Ambulatory Visit: Payer: Self-pay | Admitting: Family Medicine

## 2013-12-03 DIAGNOSIS — IMO0001 Reserved for inherently not codable concepts without codable children: Secondary | ICD-10-CM | POA: Diagnosis not present

## 2013-12-03 DIAGNOSIS — Z8673 Personal history of transient ischemic attack (TIA), and cerebral infarction without residual deficits: Secondary | ICD-10-CM | POA: Diagnosis not present

## 2013-12-03 DIAGNOSIS — E1149 Type 2 diabetes mellitus with other diabetic neurological complication: Secondary | ICD-10-CM | POA: Diagnosis not present

## 2013-12-03 DIAGNOSIS — E1142 Type 2 diabetes mellitus with diabetic polyneuropathy: Secondary | ICD-10-CM | POA: Diagnosis not present

## 2013-12-04 ENCOUNTER — Emergency Department (HOSPITAL_COMMUNITY)
Admission: EM | Admit: 2013-12-04 | Discharge: 2013-12-04 | Disposition: A | Discharge status: Home / Self Care | Payer: Medicare Other | Attending: Emergency Medicine | Admitting: Emergency Medicine

## 2013-12-04 ENCOUNTER — Telehealth: Payer: Self-pay | Admitting: Family Medicine

## 2013-12-04 ENCOUNTER — Emergency Department (HOSPITAL_COMMUNITY): Payer: Medicare Other

## 2013-12-04 ENCOUNTER — Encounter (HOSPITAL_COMMUNITY): Payer: Self-pay | Admitting: Emergency Medicine

## 2013-12-04 DIAGNOSIS — F329 Major depressive disorder, single episode, unspecified: Secondary | ICD-10-CM | POA: Insufficient documentation

## 2013-12-04 DIAGNOSIS — E119 Type 2 diabetes mellitus without complications: Secondary | ICD-10-CM | POA: Insufficient documentation

## 2013-12-04 DIAGNOSIS — Z7982 Long term (current) use of aspirin: Secondary | ICD-10-CM | POA: Insufficient documentation

## 2013-12-04 DIAGNOSIS — S8263XA Displaced fracture of lateral malleolus of unspecified fibula, initial encounter for closed fracture: Secondary | ICD-10-CM | POA: Diagnosis not present

## 2013-12-04 DIAGNOSIS — Z79899 Other long term (current) drug therapy: Secondary | ICD-10-CM | POA: Diagnosis not present

## 2013-12-04 DIAGNOSIS — Z23 Encounter for immunization: Secondary | ICD-10-CM | POA: Insufficient documentation

## 2013-12-04 DIAGNOSIS — Z862 Personal history of diseases of the blood and blood-forming organs and certain disorders involving the immune mechanism: Secondary | ICD-10-CM | POA: Diagnosis not present

## 2013-12-04 DIAGNOSIS — T148XXA Other injury of unspecified body region, initial encounter: Secondary | ICD-10-CM

## 2013-12-04 DIAGNOSIS — K219 Gastro-esophageal reflux disease without esophagitis: Secondary | ICD-10-CM | POA: Insufficient documentation

## 2013-12-04 DIAGNOSIS — Y929 Unspecified place or not applicable: Secondary | ICD-10-CM | POA: Insufficient documentation

## 2013-12-04 DIAGNOSIS — F3289 Other specified depressive episodes: Secondary | ICD-10-CM | POA: Insufficient documentation

## 2013-12-04 DIAGNOSIS — F411 Generalized anxiety disorder: Secondary | ICD-10-CM | POA: Insufficient documentation

## 2013-12-04 DIAGNOSIS — Z87891 Personal history of nicotine dependence: Secondary | ICD-10-CM | POA: Diagnosis not present

## 2013-12-04 DIAGNOSIS — Z792 Long term (current) use of antibiotics: Secondary | ICD-10-CM | POA: Insufficient documentation

## 2013-12-04 DIAGNOSIS — Y9389 Activity, other specified: Secondary | ICD-10-CM | POA: Insufficient documentation

## 2013-12-04 DIAGNOSIS — S0190XA Unspecified open wound of unspecified part of head, initial encounter: Secondary | ICD-10-CM | POA: Diagnosis not present

## 2013-12-04 DIAGNOSIS — S99919A Unspecified injury of unspecified ankle, initial encounter: Secondary | ICD-10-CM | POA: Diagnosis not present

## 2013-12-04 DIAGNOSIS — S0100XA Unspecified open wound of scalp, initial encounter: Secondary | ICD-10-CM | POA: Insufficient documentation

## 2013-12-04 DIAGNOSIS — Z8669 Personal history of other diseases of the nervous system and sense organs: Secondary | ICD-10-CM | POA: Diagnosis not present

## 2013-12-04 DIAGNOSIS — S91309A Unspecified open wound, unspecified foot, initial encounter: Secondary | ICD-10-CM | POA: Insufficient documentation

## 2013-12-04 DIAGNOSIS — S0990XA Unspecified injury of head, initial encounter: Secondary | ICD-10-CM | POA: Insufficient documentation

## 2013-12-04 DIAGNOSIS — Z859 Personal history of malignant neoplasm, unspecified: Secondary | ICD-10-CM | POA: Diagnosis not present

## 2013-12-04 DIAGNOSIS — T1490XA Injury, unspecified, initial encounter: Secondary | ICD-10-CM | POA: Diagnosis not present

## 2013-12-04 DIAGNOSIS — IMO0002 Reserved for concepts with insufficient information to code with codable children: Secondary | ICD-10-CM | POA: Diagnosis not present

## 2013-12-04 DIAGNOSIS — I10 Essential (primary) hypertension: Secondary | ICD-10-CM | POA: Insufficient documentation

## 2013-12-04 DIAGNOSIS — W208XXA Other cause of strike by thrown, projected or falling object, initial encounter: Secondary | ICD-10-CM | POA: Insufficient documentation

## 2013-12-04 DIAGNOSIS — S0101XA Laceration without foreign body of scalp, initial encounter: Secondary | ICD-10-CM

## 2013-12-04 DIAGNOSIS — S0993XA Unspecified injury of face, initial encounter: Secondary | ICD-10-CM | POA: Diagnosis not present

## 2013-12-04 DIAGNOSIS — S199XXA Unspecified injury of neck, initial encounter: Secondary | ICD-10-CM | POA: Diagnosis not present

## 2013-12-04 DIAGNOSIS — S8990XA Unspecified injury of unspecified lower leg, initial encounter: Secondary | ICD-10-CM | POA: Diagnosis not present

## 2013-12-04 LAB — URINE MICROSCOPIC-ADD ON

## 2013-12-04 LAB — URINALYSIS, ROUTINE W REFLEX MICROSCOPIC
Bilirubin Urine: NEGATIVE
Glucose, UA: NEGATIVE mg/dL
Ketones, ur: NEGATIVE mg/dL
Nitrite: NEGATIVE
Protein, ur: NEGATIVE mg/dL
Specific Gravity, Urine: 1.02 (ref 1.005–1.030)
Urobilinogen, UA: 0.2 mg/dL (ref 0.0–1.0)
pH: 5.5 (ref 5.0–8.0)

## 2013-12-04 MED ORDER — OXYCODONE-ACETAMINOPHEN 5-325 MG PO TABS: 1.0000 | ORAL_TABLET | ORAL | Status: DC | PRN

## 2013-12-04 MED ORDER — TETANUS-DIPHTH-ACELL PERTUSSIS 5-2.5-18.5 LF-MCG/0.5 IM SUSP
0.5000 mL | Freq: Once | INTRAMUSCULAR | Status: AC
  Administered 2013-12-04: 0.5 mL via INTRAMUSCULAR
  Filled 2013-12-04: qty 0.5

## 2013-12-04 MED ORDER — OXYCODONE-ACETAMINOPHEN 5-325 MG PO TABS
1.0000 | ORAL_TABLET | Freq: Once | ORAL | Status: AC
  Administered 2013-12-04: 1 via ORAL
  Filled 2013-12-04: qty 1

## 2013-12-04 NOTE — ED Notes (Signed)
Patient from [facility] in [place]. States was getting up to go to fridge when she fell. Laceration to back of head, laceration to left foot, right ankle pain.

## 2013-12-04 NOTE — Discharge Instructions (Signed)
Ankle Fracture A fracture is a break in the bone. A cast or splint is used to protect and keep your injured bone from moving.  HOME CARE INSTRUCTIONS   Use your crutches as directed.  To lessen the swelling, keep the injured leg elevated while sitting or lying down.  Apply ice to the injury for 15-20 minutes, 03-04 times per day while awake for 2 days. Put the ice in a plastic bag and place a thin towel between the bag of ice and your cast.  If you have a plaster or fiberglass cast:  Do not try to scratch the skin under the cast using sharp or pointed objects.  Check the skin around the cast every day. You may put lotion on any red or sore areas.  Keep your cast dry and clean.  If you have a plaster splint:  Wear the splint as directed.  You may loosen the elastic around the splint if your toes become numb, tingle, or turn cold or blue.  Do not put pressure on any part of your cast or splint; it may break. Rest your cast only on a pillow the first 24 hours until it is fully hardened.  Your cast or splint can be protected during bathing with a plastic bag. Do not lower the cast or splint into water.  Take medications as directed by your caregiver. Only take over-the-counter or prescription medicines for pain, discomfort, or fever as directed by your caregiver.  Do not drive a vehicle until your caregiver specifically tells you it is safe to do so.  If your caregiver has given you a follow-up appointment, it is very important to keep that appointment. Not keeping the appointment could result in a chronic or permanent injury, pain, and disability. If there is any problem keeping the appointment, you must call back to this facility for assistance. SEEK IMMEDIATE MEDICAL CARE IF:   Your cast gets damaged or breaks.  You have continued severe pain or more swelling than you did before the cast was put on.  Your skin or toenails below the injury turn blue or gray, or feel cold or  numb.  There is a bad smell or new stains and/or purulent (pus like) drainage coming from under the cast. If you do not have a window in your cast for observing the wound, a discharge or minor bleeding may show up as a stain on the outside of your cast. Report these findings to your caregiver. MAKE SURE YOU:   Understand these instructions.  Will watch your condition.  Will get help right away if you are not doing well or get worse. Document Released: 06/16/2000 Document Revised: 09/11/2011 Document Reviewed: 01/16/2013 [facility] Patient Information 2014 [place], [place].  Head Injury, Adult You have received a head injury. It does not appear serious at this time. Headaches and vomiting are common following head injury. It should be easy to awaken from sleeping. Sometimes it is necessary for you to stay in the emergency department for a while for observation. Sometimes admission to the hospital may be needed. After injuries such as yours, most problems occur within the first 24 hours, but side effects may occur up to 7 10 days after the injury. It is important for you to carefully monitor your condition and contact your health care provider or seek immediate medical care if there is a change in your condition. WHAT ARE THE TYPES OF HEAD INJURIES? Head injuries can be as minor as a bump. Some head  injuries can be more severe. More severe head injuries include:  A jarring injury to the brain (concussion).  A bruise of the brain (contusion). This mean there is bleeding in the brain that can cause swelling.  A cracked skull (skull fracture).  Bleeding in the brain that collects, clots, and forms a bump (hematoma). WHAT CAUSES A HEAD INJURY? A serious head injury is most likely to happen to someone who is in a car wreck and is not wearing a seat belt. Other causes of major head injuries include bicycle or motorcycle accidents, sports injuries, and falls. HOW ARE HEAD INJURIES DIAGNOSED? A  complete history of the event leading to the injury and your current symptoms will be helpful in diagnosing head injuries. Many times, pictures of the brain, such as CT or MRI are needed to see the extent of the injury. Often, an overnight hospital stay is necessary for observation.  WHEN SHOULD I SEEK IMMEDIATE MEDICAL CARE?  You should get help right away if:  You have confusion or drowsiness.  You feel sick to your stomach (nauseous) or have continued, forceful vomiting.  You have dizziness or unsteadiness that is getting worse.  You have severe, continued headaches not relieved by medicine. Only take over-the-counter or prescription medicines for pain, fever, or discomfort as directed by your health care provider.  You do not have normal function of the arms or legs or are unable to walk.  You notice changes in the black spots in the center of the colored part of your eye (pupil).  You have a clear or bloody fluid coming from your nose or ears.  You have a loss of vision. During the next 24 hours after the injury, you must stay with someone who can watch you for the warning signs. This person should contact local emergency services (911 in the [place] if you have seizures, you become unconscious, or you are unable to wake up. HOW CAN I PREVENT A HEAD INJURY IN THE FUTURE? The most important factor for preventing major head injuries is avoiding motor vehicle accidents. To minimize the potential for damage to your head, it is crucial to wear seat belts while riding in motor vehicles. Wearing helmets while bike riding and playing collision sports (like football) is also helpful. Also, avoiding dangerous activities around the house will further help reduce your risk of head injury.  WHEN CAN I RETURN TO NORMAL ACTIVITIES AND ATHLETICS? You should be reevaluated by your health care provider before returning to these activities. If you have any of the following symptoms, you should not return to  activities or contact sports until 1 week after the symptoms have stopped:  Persistent headache.  Dizziness or vertigo.  Poor attention and concentration.  Confusion.  Memory problems.  Nausea or vomiting.  Fatigue or tire easily.  Irritability.  Intolerant of bright lights or loud noises.  Anxiety or depression.  Disturbed sleep. MAKE SURE YOU:   Understand these instructions.  Will watch your condition.  Will get help right away if you are not doing well or get worse. Document Released: 06/19/2005 Document Revised: 04/09/2013 Document Reviewed: 02/24/2013 [facility] Patient Information 2014 [place].  Staple Wound Closure Staples are used to help a wound heal faster by holding the edges of the wound together. HOME CARE  Keep the area around the staples clean and dry.  Rest and raise (elevate) the injured part above the level of your heart.  See your doctor for a follow-up check of the  wound.  See your doctor to have the staples removed.  Clean the wound daily with water.  Do not soak the wound in water for long periods of time.  Let air reach the wound as it heals. GET HELP RIGHT AWAY IF:   You have redness or puffiness around the wound.  You have a red line going away from the wound.  You have more pain or tenderness.  You have yellowish-white fluid (pus) coming from the wound.  Your wound does not stay together after the staples have been taken out.  You see something coming out of the wound, such as wood or glass.  You have problems moving the injured area.  You have a fever or lasting symptoms for more than 2-3 days.  You have a fever and your symptoms suddenly get worse. MAKE SURE YOU:   Understand these instructions.  Will watch this condition.  Will get help right away if you are not doing well or get worse. Document Released: 03/28/2008 Document Revised: 03/13/2012 Document Reviewed: 12/31/2011 [facility] Patient Information  2014 [place].

## 2013-12-04 NOTE — Telephone Encounter (Signed)
Patient fell this morning about 3 am and hit her head. She was bleeding and she is currently at [facility]. Just FYI.

## 2013-12-04 NOTE — ED Provider Notes (Signed)
CSN: [id number]     Arrival date & time 12/04/13  0414 History   First MD Initiated Contact with Patient 12/04/13 [phone]     Chief Complaint  Patient presents with  . Fall     (Consider location/radiation/quality/duration/timing/severity/associated sxs/prior Treatment) HPI  80 year old female presenting after a fall. Patient got up to get some help she was having difficulty sleeping. This is in a small refrigerator. She was getting it out, refrigerator fell forward on top of her. She fell backwards onto the floor. She struck the back of her head. No loss of consciousness. Laceration to the back of her scalp. No refrigerator fell down onto her feet she fell to the ground. She is having pain in her left foot or right ankle. Denies any significant pain anywhere else. No acute visual changes. No nausea or vomiting. No acute numbness, tingling or loss of strength. Is on a baby aspirin, otherwise no blood thinners.  Past Medical History  Diagnosis Date  . Hypertension   . Cancer   . Pernicious anemia   . Neuropathy     Feet and legs  . Diabetes mellitus without complication   . Depression 09/22/2013  . Anxiety 09/22/2013  . GERD (gastroesophageal reflux disease) 09/22/2013  . HTN (hypertension) 09/22/2013  . Thyroid nodule    Past Surgical History  Procedure Laterality Date  . Abdominal hysterectomy    . Cholecystectomy    . Appendectomy    . Bladder surgery    . Rectal surgery     Family History  Problem Relation Age of Onset  . Diabetes Sister   . Heart attack Sister    History  Substance Use Topics  . Smoking status: Former Smoker    Quit date: 11/20/1985  . Smokeless tobacco: Not on file     Comment: Over 30 years ago  . Alcohol Use: No   OB History   Grav Para Term Preterm Abortions TAB SAB Ect Mult Living                 Review of Systems  All systems reviewed and negative, other than as noted in HPI.   Allergies  Amoxil; Cymbalta; Macrobid; and Tramadol  Home  Medications   Prior to Admission medications   Medication Sig Start Date End Date Taking? Authorizing Provider  aspirin EC 81 MG EC tablet Take 1 tablet (81 mg total) by mouth daily. 09/23/13  Yes Lezlie Octave Black, NP  citalopram (CELEXA) 40 MG tablet TAKE ONE TABLET BY MOUTH ONCE DAILY   Yes [name], MD  busPIRone (BUSPAR) 15 MG tablet TAKE ONE TABLET BY MOUTH TWICE DAILY 11/11/13   [name], MD  ciprofloxacin (CIPRO) 500 MG tablet Take 1 tablet (500 mg total) by mouth 2 (two) times daily. 11/11/13   [name], MD  cloNIDine (CATAPRES) 0.2 MG tablet TAKE ONE TABLET BY MOUTH TWICE DAILY 07/17/13   [name], MD  fluticasone (FLONASE) 50 MCG/ACT nasal spray Place 1 spray into the nose daily. 11/20/12 11/20/13  [name], MD  glipiZIDE (GLUCOTROL) 5 MG tablet TAKE 1/2 TABLET BY MOUTH TWICE DAILY BEFORE A MEAL    Scott A Luking, MD  HYDROcodone-acetaminophen (NORCO/VICODIN) 5-325 MG per tablet 1/2 tablet q8 hours prn pain ( best to use at night because of drowsiness) 10/28/13   [name], MD  hyoscyamine (LEVSIN SL) 0.125 MG SL tablet TAKE ONE TABLET UNDER THE TONGUE THREE TIMES DAILY AS NEEDED FOR CRAMPING  11/11/13   [name], MD  loratadine (CLARITIN) 10 MG tablet Take 1 tablet (10 mg total) by mouth daily. 09/26/13   [name], MD  losartan (COZAAR) 100 MG tablet TAKE ONE TABLET BY MOUTH ONCE DAILY 06/23/13   [name], MD  meclizine (ANTIVERT) 12.5 MG tablet TAKE ONE TABLET BY MOUTH TWICE DAILY AS NEEDED    [name], MD  metFORMIN (GLUCOPHAGE) 500 MG tablet 1 in am then 1.5 at supper 11/11/13   [name], MD  metoprolol succinate (TOPROL-XL) 25 MG 24 hr tablet TAKE ONE TABLET BY MOUTH ONCE DAILY    [name], MD  metroNIDAZOLE (FLAGYL) 500 MG tablet Take 1 tablet (500 mg total) by mouth 3 (three) times daily. 11/11/13   [name], MD  pantoprazole (PROTONIX) 40 MG tablet Take 1 tablet (40 mg total) by mouth daily. 03/25/13 03/25/14  [name], MD  pravastatin (PRAVACHOL) 40 MG tablet Take 1 tablet (40 mg total) by mouth every evening. 05/27/13 05/27/14  [name], MD  sulfamethoxazole-trimethoprim (BACTRIM DS) 800-160 MG per tablet Take 1 tablet by mouth 2 (two) times daily. 11/27/13   [name], MD  TRUETRACK TEST test strip TEST ONCE A DAY 04/30/13   [name], MD   BP 151/91  Pulse 80  Temp(Src) 98.3 F (36.8 C) (Oral)  Resp 14  Ht 5\' 5"  (1.651 m)  Wt 192 lb (87.091 kg)  BMI 31.95 kg/m2  SpO2 100% Physical Exam  Nursing note and vitals reviewed. Constitutional: She appears well-developed and well-nourished. No distress.  HENT:  Head: Normocephalic.  2 cm laceration to the posterior scalp. no significant underlying bony tenderness.  Eyes: Conjunctivae are normal. Right eye exhibits no discharge. Left eye exhibits no discharge.  Neck: Neck supple.  Cardiovascular: Normal rate, regular rhythm and normal heart sounds.  Exam reveals no gallop and no friction rub.   No murmur heard. Pulmonary/Chest: Effort normal and breath sounds normal. No respiratory distress.  Abdominal: Soft. She exhibits no distension. There is no tenderness.  Musculoskeletal: She exhibits no edema and no tenderness.  Small skin tear to the dorsum of the left foot with some underlying bony tenderness. Neurovascularly intact distally. Swelling and ecchymosis of the right ankle. Neurovascularly intact. No significant pain of the extremities elsewhere. No apparent pain range of motion of the large joints elsewhere.  Neurological: She is alert.  Skin: Skin is warm and dry.  Psychiatric: She has a normal mood and affect. Her behavior is normal. Thought content normal.    ED Course  Procedures (including critical care time)  LACERATION REPAIR Performed by: Virgel Manifold Authorized by: Virgel Manifold Consent: Verbal consent obtained. Risks and benefits: risks, benefits and alternatives were discussed Consent given by:  patient Patient identity confirmed: provided demographic data Prepped and Draped in normal sterile fashion Wound explored  Laceration Location: Posterior scalp  Laceration Length: 2 cm  No Foreign Bodies seen or palpated  Anesthesia: local infiltration  Local anesthetic: n/a Anesthetic total: None Irrigation method: syringe Amount of cleaning: standard  Skin closure: Stapled Number of sutures: 3   Technique: Stapled   Patient tolerance: Patient tolerated the procedure well with no immediate complications.  Labs Review Labs Reviewed - No data to display  Imaging Review No results found.  Dg Ankle Complete Right  12/04/2013   CLINICAL DATA:  Fall.  Ankle pain.  EXAM: RIGHT ANKLE - COMPLETE 3+ VIEW  COMPARISON:  None.  FINDINGS: Small bony crescent below the lateral malleolus consistent with an avulsion fracture. There is mild medial malleolus irregularity, without definitive fracture. There is soft tissue swelling about the ankle, especially laterally. Deformity of the distal fibular diaphysis consistent with healed fracture. Osteopenia. No malalignment. Arterial calcification.  IMPRESSION: 1. Tiny lateral malleolus avulsion fracture. 2. Healed distal fibular diaphysis fracture.   Electronically Signed   By: Jorje Guild M.D.   On: 12/04/2013 06:01   Ct Head Wo Contrast  12/04/2013   CLINICAL DATA:  Fall with head laceration  EXAM: CT HEAD WITHOUT CONTRAST  CT CERVICAL SPINE WITHOUT CONTRAST  TECHNIQUE: Multidetector CT imaging of the head and cervical spine was performed following the standard protocol without intravenous contrast. Multiplanar CT image reconstructions of the cervical spine were also generated.  COMPARISON:  09/21/2013  FINDINGS: CT HEAD FINDINGS  Skull and Sinuses:High right parietal scalp wound with subcutaneous emphysema. No radiopaque foreign body or fracture. There is chronic opacification of the small right frontal sinus.  Orbits: Bilateral cataract resection.   Brain: No evidence of acute abnormality, such as acute infarction, hemorrhage, hydrocephalus, or mass lesion/mass effect. Generalized brain atrophy, age appropriate. There is chronic still vessel disease, also expected for age. Ischemic gliosis is best seen around the frontal horns of the lateral ventricles  CT CERVICAL SPINE FINDINGS  Negative for cervical spine fracture. Spinal alignment is stable from March 06, 2010, including mild kyphotic curvature centered at C5-6 (related to focally advanced degenerative disc narrowing) and mild C4-5 anterolisthesis (related to advanced left facet osteoarthritis). There is diffuse degenerative disc narrowing, again most advanced at C5-6 where there is posterior osteophytic ridging narrowing the ventral thecal sac. Facet osteoarthritis is most notable in the upper cervical region on the left, with left C2-3 facet ankylosis.  IMPRESSION: 1. No acute intracranial or cervical spine injuries. 2. Brain and cervical spine senescent changes noted above.   Electronically Signed   By: Jorje Guild M.D.   On: 12/04/2013 05:57   Ct Cervical Spine Wo Contrast  12/04/2013   CLINICAL DATA:  Fall with head laceration  EXAM: CT HEAD WITHOUT CONTRAST  CT CERVICAL SPINE WITHOUT CONTRAST  TECHNIQUE: Multidetector CT imaging of the head and cervical spine was performed following the standard protocol without intravenous contrast. Multiplanar CT image reconstructions of the cervical spine were also generated.  COMPARISON:  09/21/2013  FINDINGS: CT HEAD FINDINGS  Skull and Sinuses:High right parietal scalp wound with subcutaneous emphysema. No radiopaque foreign body or fracture. There is chronic opacification of the small right frontal sinus.  Orbits: Bilateral cataract resection.  Brain: No evidence of acute abnormality, such as acute infarction, hemorrhage, hydrocephalus, or mass lesion/mass effect. Generalized brain atrophy, age appropriate. There is chronic still vessel disease, also  expected for age. Ischemic gliosis is best seen around the frontal horns of the lateral ventricles  CT CERVICAL SPINE FINDINGS  Negative for cervical spine fracture. Spinal alignment is stable from March 06, 2010, including mild kyphotic curvature centered at C5-6 (related to focally advanced degenerative disc narrowing) and mild C4-5 anterolisthesis (related to advanced left facet osteoarthritis). There is diffuse degenerative disc narrowing, again most advanced at C5-6 where there is posterior osteophytic ridging narrowing the ventral thecal sac. Facet osteoarthritis is most notable in the upper cervical region on the left, with left C2-3 facet ankylosis.  IMPRESSION: 1. No acute intracranial or cervical spine injuries. 2. Brain and cervical spine senescent changes noted above.   Electronically Signed  By: Jorje Guild M.D.   On: 12/04/2013 05:57   Dg Foot Complete Left  12/04/2013   CLINICAL DATA:  Fall.  EXAM: LEFT FOOT - COMPLETE 3+ VIEW  COMPARISON:  None.  FINDINGS: No acute fracture or malalignment. Mild, chronic deformity of the fifth metatarsal shaft suggests healed fracture. Osteopenia. Arterial calcification in the lower leg.  IMPRESSION: No acute osseous findings.   Electronically Signed   By: Jorje Guild M.D.   On: 12/04/2013 05:58    EKG Interpretation None      MDM   Final diagnoses:  Scalp laceration  Head injury  Multiple skin tears  Avulsion fracture of lateral malleolus    80 year old female with mechanical fall. Scalp laceration which was stapled. Nonfocal neurological examination. Patient is on aspirin. With her advanced age will CT her head as well as her cervical spine. Imaging of her left foot and right ankle. Update tetanus. Patient is declining pain medication.    Virgel Manifold, MD 12/09/13 Shelah Lewandowsky

## 2013-12-09 DIAGNOSIS — S93409A Sprain of unspecified ligament of unspecified ankle, initial encounter: Secondary | ICD-10-CM | POA: Diagnosis not present

## 2013-12-09 DIAGNOSIS — S9030XA Contusion of unspecified foot, initial encounter: Secondary | ICD-10-CM | POA: Diagnosis not present

## 2013-12-10 ENCOUNTER — Ambulatory Visit (INDEPENDENT_AMBULATORY_CARE_PROVIDER_SITE_OTHER): Payer: Medicare Other | Admitting: Family Medicine

## 2013-12-10 ENCOUNTER — Encounter: Payer: Self-pay | Admitting: Family Medicine

## 2013-12-10 VITALS — BP 110/68 | Temp 98.0°F | Ht 60.5 in | Wt 192.0 lb

## 2013-12-10 DIAGNOSIS — D649 Anemia, unspecified: Secondary | ICD-10-CM

## 2013-12-10 DIAGNOSIS — R350 Frequency of micturition: Secondary | ICD-10-CM

## 2013-12-10 LAB — POCT URINALYSIS DIPSTICK
Spec Grav, UA: 1.025
pH, UA: 5

## 2013-12-10 MED ORDER — CYANOCOBALAMIN 1000 MCG/ML IJ SOLN
1000.0000 ug | Freq: Once | INTRAMUSCULAR | Status: AC
  Administered 2013-12-10: 1000 ug via INTRAMUSCULAR

## 2013-12-10 NOTE — Progress Notes (Signed)
   Subjective:    Patient ID: Meghan Duran, female    DOB: 04-10-1924, 80 y.o.   MRN: [id number]  HPI Follow up on UTI. Frequent urination. Finished antibiotic.   Had fall on June 4th. Went to ED. Knot on head. Has staples in head. Right ankle fracture. Seeing ortho Dr. Lynann Bologna.  X-ray shows a small avulsion fracture should heal well Sinus congestion started today.   Vitamin B 12 injection.  ER records were reviewed in detail Review of Systems    denies shortness breath nausea vomiting diarrhea Objective:   Physical Exam  Lungs clear hearts regular she has staples on the back of her head we offered to take those out on Friday. Sinuses nontender legs look normal no significant swelling. B12 shot today      Assessment & Plan:  Pernicious anemia B12 shot Urine culture sent Staples out on Friday Recovering from the fall no residual effects keep all followup appointments

## 2013-12-12 LAB — URINE CULTURE

## 2013-12-26 ENCOUNTER — Other Ambulatory Visit: Payer: Self-pay | Admitting: Family Medicine

## 2013-12-26 NOTE — Telephone Encounter (Signed)
Recent uti req ref

## 2013-12-30 DIAGNOSIS — S93409A Sprain of unspecified ligament of unspecified ankle, initial encounter: Secondary | ICD-10-CM | POA: Diagnosis not present

## 2014-01-03 DIAGNOSIS — R279 Unspecified lack of coordination: Secondary | ICD-10-CM | POA: Diagnosis not present

## 2014-01-03 DIAGNOSIS — I10 Essential (primary) hypertension: Secondary | ICD-10-CM | POA: Diagnosis not present

## 2014-01-03 DIAGNOSIS — E119 Type 2 diabetes mellitus without complications: Secondary | ICD-10-CM | POA: Diagnosis not present

## 2014-01-03 DIAGNOSIS — Z9181 History of falling: Secondary | ICD-10-CM | POA: Diagnosis not present

## 2014-01-03 DIAGNOSIS — IMO0001 Reserved for inherently not codable concepts without codable children: Secondary | ICD-10-CM | POA: Diagnosis not present

## 2014-01-05 DIAGNOSIS — IMO0001 Reserved for inherently not codable concepts without codable children: Secondary | ICD-10-CM | POA: Diagnosis not present

## 2014-01-05 DIAGNOSIS — E119 Type 2 diabetes mellitus without complications: Secondary | ICD-10-CM | POA: Diagnosis not present

## 2014-01-05 DIAGNOSIS — I10 Essential (primary) hypertension: Secondary | ICD-10-CM | POA: Diagnosis not present

## 2014-01-05 DIAGNOSIS — R279 Unspecified lack of coordination: Secondary | ICD-10-CM | POA: Diagnosis not present

## 2014-01-05 DIAGNOSIS — Z9181 History of falling: Secondary | ICD-10-CM | POA: Diagnosis not present

## 2014-01-06 ENCOUNTER — Other Ambulatory Visit: Payer: Self-pay | Admitting: Family Medicine

## 2014-01-07 ENCOUNTER — Encounter: Payer: Self-pay | Admitting: Family Medicine

## 2014-01-07 ENCOUNTER — Ambulatory Visit (INDEPENDENT_AMBULATORY_CARE_PROVIDER_SITE_OTHER): Payer: Medicare Other | Admitting: Family Medicine

## 2014-01-07 VITALS — BP 104/60 | Ht 60.5 in | Wt 193.4 lb

## 2014-01-07 DIAGNOSIS — M5432 Sciatica, left side: Secondary | ICD-10-CM

## 2014-01-07 DIAGNOSIS — I951 Orthostatic hypotension: Secondary | ICD-10-CM

## 2014-01-07 DIAGNOSIS — D510 Vitamin B12 deficiency anemia due to intrinsic factor deficiency: Secondary | ICD-10-CM | POA: Diagnosis not present

## 2014-01-07 DIAGNOSIS — IMO0001 Reserved for inherently not codable concepts without codable children: Secondary | ICD-10-CM | POA: Diagnosis not present

## 2014-01-07 DIAGNOSIS — R279 Unspecified lack of coordination: Secondary | ICD-10-CM | POA: Diagnosis not present

## 2014-01-07 DIAGNOSIS — I10 Essential (primary) hypertension: Secondary | ICD-10-CM | POA: Diagnosis not present

## 2014-01-07 DIAGNOSIS — M5430 Sciatica, unspecified side: Secondary | ICD-10-CM

## 2014-01-07 DIAGNOSIS — E119 Type 2 diabetes mellitus without complications: Secondary | ICD-10-CM

## 2014-01-07 DIAGNOSIS — Z9181 History of falling: Secondary | ICD-10-CM | POA: Diagnosis not present

## 2014-01-07 LAB — POCT GLYCOSYLATED HEMOGLOBIN (HGB A1C): Hemoglobin A1C: 4.1

## 2014-01-07 MED ORDER — CLONIDINE HCL 0.1 MG PO TABS: 0.1000 mg | ORAL_TABLET | Freq: Two times a day (BID) | ORAL | Status: DC

## 2014-01-07 MED ORDER — GLIPIZIDE 5 MG PO TABS: ORAL_TABLET | ORAL | Status: DC

## 2014-01-07 MED ORDER — CYANOCOBALAMIN 1000 MCG/ML IJ SOLN
1000.0000 ug | Freq: Once | INTRAMUSCULAR | Status: AC
  Administered 2014-01-07: 1000 ug via INTRAMUSCULAR

## 2014-01-07 NOTE — Progress Notes (Signed)
   Subjective:    Patient ID: Meghan Duran, female    DOB: May 06, 1924, 80 y.o.   MRN: [id number]  HPI    Review of Systems     Objective:   Physical Exam        Assessment & Plan:

## 2014-01-07 NOTE — Patient Instructions (Signed)
A1C is 4.1  Reduce Glipizide to 1/2 in the am only  Blood pressure too low  Reduce clonidine to 0.1 one 2 times a day  B12 shot today  Recheck in 4 weeks

## 2014-01-07 NOTE — Progress Notes (Signed)
   Subjective:    Patient ID: Meghan Duran, female    DOB: 01-02-1924, 80 y.o.   MRN: [id number]  Diabetes She presents for her follow-up diabetic visit. She has type 2 diabetes mellitus. Her disease course has been stable. There are no hypoglycemic associated symptoms. Pertinent negatives for hypoglycemia include no confusion. There are no diabetic associated symptoms. Pertinent negatives for diabetes include no chest pain, no fatigue, no polydipsia, no polyphagia and no weakness. Symptoms are stable. There are no diabetic complications. There are no known risk factors for coronary artery disease. Current diabetic treatment includes oral agent (dual therapy). She is compliant with treatment all of the time.  A1C is 4.1.   Patient states that her blood pressure has been running low, fatigue, back pain and left leg pain. She relates having fatigue and tiredness especially when she stands up she feels like she is going to pass out. She also describes some intermittent numbness and pain down the leg. She has had problems with renal insufficiency in the past and because of her age I do not think it is wise to restart Relafen Wants to know if she can take nabumetone for the back pain.   Review of Systems  Constitutional: Negative for activity change, appetite change and fatigue.  HENT: Negative for congestion.   Respiratory: Negative for cough and shortness of breath.   Cardiovascular: Negative for chest pain.  Gastrointestinal: Negative for abdominal pain.  Endocrine: Negative for polydipsia and polyphagia.  Genitourinary: Negative for frequency.  Neurological: Negative for weakness.  Psychiatric/Behavioral: Negative for confusion.       Objective:   Physical Exam  Vitals reviewed. Constitutional: She appears well-nourished. No distress.  Cardiovascular: Normal rate, regular rhythm and normal heart sounds.   No murmur heard. Pulmonary/Chest: Effort normal and breath sounds normal. No  respiratory distress.  Musculoskeletal: She exhibits no edema.  Lymphadenopathy:    She has no cervical adenopathy.  Neurological: She is alert. She exhibits normal muscle tone.  Psychiatric: Her behavior is normal.          Assessment & Plan:  Pernicious anemia B12 today  Diabetes A1c is fantastic. Reduce medications. Decrease glipizide to just once daily reduce metformin in half. Proper diet discussed she is able to stay somewhat active.  We will recheck the patient in one month's time.  Orthostatic hypotension. We will reduce clonidine 0.1 mg twice a day when she follows up recheck blood pressure sitting standing. May be able to reduce medication further   Arthralgias-I. would recommend Tylenol. I would avoid NSAIDs because of her age

## 2014-01-08 DIAGNOSIS — I10 Essential (primary) hypertension: Secondary | ICD-10-CM | POA: Diagnosis not present

## 2014-01-08 DIAGNOSIS — IMO0001 Reserved for inherently not codable concepts without codable children: Secondary | ICD-10-CM | POA: Diagnosis not present

## 2014-01-08 DIAGNOSIS — Z9181 History of falling: Secondary | ICD-10-CM | POA: Diagnosis not present

## 2014-01-08 DIAGNOSIS — R279 Unspecified lack of coordination: Secondary | ICD-10-CM | POA: Diagnosis not present

## 2014-01-08 DIAGNOSIS — E119 Type 2 diabetes mellitus without complications: Secondary | ICD-10-CM | POA: Diagnosis not present

## 2014-01-13 DIAGNOSIS — I10 Essential (primary) hypertension: Secondary | ICD-10-CM | POA: Diagnosis not present

## 2014-01-13 DIAGNOSIS — R279 Unspecified lack of coordination: Secondary | ICD-10-CM | POA: Diagnosis not present

## 2014-01-13 DIAGNOSIS — Z9181 History of falling: Secondary | ICD-10-CM | POA: Diagnosis not present

## 2014-01-13 DIAGNOSIS — E119 Type 2 diabetes mellitus without complications: Secondary | ICD-10-CM | POA: Diagnosis not present

## 2014-01-13 DIAGNOSIS — IMO0001 Reserved for inherently not codable concepts without codable children: Secondary | ICD-10-CM | POA: Diagnosis not present

## 2014-01-15 ENCOUNTER — Other Ambulatory Visit: Payer: Self-pay | Admitting: Family Medicine

## 2014-01-16 DIAGNOSIS — E119 Type 2 diabetes mellitus without complications: Secondary | ICD-10-CM | POA: Diagnosis not present

## 2014-01-16 DIAGNOSIS — I10 Essential (primary) hypertension: Secondary | ICD-10-CM | POA: Diagnosis not present

## 2014-01-16 DIAGNOSIS — R279 Unspecified lack of coordination: Secondary | ICD-10-CM | POA: Diagnosis not present

## 2014-01-16 DIAGNOSIS — Z9181 History of falling: Secondary | ICD-10-CM | POA: Diagnosis not present

## 2014-01-16 DIAGNOSIS — IMO0001 Reserved for inherently not codable concepts without codable children: Secondary | ICD-10-CM | POA: Diagnosis not present

## 2014-01-20 ENCOUNTER — Telehealth: Payer: Self-pay | Admitting: Family Medicine

## 2014-01-20 MED ORDER — CIPROFLOXACIN HCL 250 MG PO TABS: 250.0000 mg | ORAL_TABLET | Freq: Two times a day (BID) | ORAL | Status: DC

## 2014-01-20 NOTE — Telephone Encounter (Signed)
Med sent to pharmacy. Patient's daughter was notified.

## 2014-01-20 NOTE — Telephone Encounter (Signed)
Patient said she has another UTI and would like another Rx for cipro. She was seen for this last back in June so she thought we would go ahead and call in medication since she keeps these so frequently.  [facility].

## 2014-01-20 NOTE — Telephone Encounter (Signed)
Cipro 250 mg 1 twice a day for 5 days followup if ongoing troubles

## 2014-01-21 DIAGNOSIS — E119 Type 2 diabetes mellitus without complications: Secondary | ICD-10-CM | POA: Diagnosis not present

## 2014-01-21 DIAGNOSIS — IMO0001 Reserved for inherently not codable concepts without codable children: Secondary | ICD-10-CM | POA: Diagnosis not present

## 2014-01-21 DIAGNOSIS — I10 Essential (primary) hypertension: Secondary | ICD-10-CM | POA: Diagnosis not present

## 2014-01-21 DIAGNOSIS — R279 Unspecified lack of coordination: Secondary | ICD-10-CM | POA: Diagnosis not present

## 2014-01-21 DIAGNOSIS — Z9181 History of falling: Secondary | ICD-10-CM | POA: Diagnosis not present

## 2014-01-26 DIAGNOSIS — IMO0001 Reserved for inherently not codable concepts without codable children: Secondary | ICD-10-CM | POA: Diagnosis not present

## 2014-01-26 DIAGNOSIS — E119 Type 2 diabetes mellitus without complications: Secondary | ICD-10-CM | POA: Diagnosis not present

## 2014-01-26 DIAGNOSIS — Z9181 History of falling: Secondary | ICD-10-CM | POA: Diagnosis not present

## 2014-01-26 DIAGNOSIS — R279 Unspecified lack of coordination: Secondary | ICD-10-CM | POA: Diagnosis not present

## 2014-01-26 DIAGNOSIS — I10 Essential (primary) hypertension: Secondary | ICD-10-CM | POA: Diagnosis not present

## 2014-02-04 ENCOUNTER — Inpatient Hospital Stay (HOSPITAL_COMMUNITY)
Admission: EM | Admit: 2014-02-04 | Discharge: 2014-02-05 | DRG: 069 | Disposition: A | Discharge status: Home / Self Care | Payer: Medicare Other | Attending: Internal Medicine | Admitting: Internal Medicine

## 2014-02-04 ENCOUNTER — Encounter (HOSPITAL_COMMUNITY): Payer: Self-pay | Admitting: General Practice

## 2014-02-04 ENCOUNTER — Inpatient Hospital Stay (HOSPITAL_COMMUNITY): Payer: Medicare Other

## 2014-02-04 ENCOUNTER — Emergency Department (HOSPITAL_COMMUNITY): Payer: Medicare Other

## 2014-02-04 ENCOUNTER — Telehealth: Payer: Self-pay | Admitting: Family Medicine

## 2014-02-04 DIAGNOSIS — G459 Transient cerebral ischemic attack, unspecified: Principal | ICD-10-CM | POA: Diagnosis present

## 2014-02-04 DIAGNOSIS — K5732 Diverticulitis of large intestine without perforation or abscess without bleeding: Secondary | ICD-10-CM | POA: Diagnosis present

## 2014-02-04 DIAGNOSIS — K219 Gastro-esophageal reflux disease without esophagitis: Secondary | ICD-10-CM

## 2014-02-04 DIAGNOSIS — R195 Other fecal abnormalities: Secondary | ICD-10-CM | POA: Diagnosis present

## 2014-02-04 DIAGNOSIS — I129 Hypertensive chronic kidney disease with stage 1 through stage 4 chronic kidney disease, or unspecified chronic kidney disease: Secondary | ICD-10-CM | POA: Diagnosis present

## 2014-02-04 DIAGNOSIS — D510 Vitamin B12 deficiency anemia due to intrinsic factor deficiency: Secondary | ICD-10-CM

## 2014-02-04 DIAGNOSIS — M6281 Muscle weakness (generalized): Secondary | ICD-10-CM

## 2014-02-04 DIAGNOSIS — Z66 Do not resuscitate: Secondary | ICD-10-CM | POA: Diagnosis present

## 2014-02-04 DIAGNOSIS — F3289 Other specified depressive episodes: Secondary | ICD-10-CM | POA: Diagnosis present

## 2014-02-04 DIAGNOSIS — E785 Hyperlipidemia, unspecified: Secondary | ICD-10-CM | POA: Diagnosis present

## 2014-02-04 DIAGNOSIS — R4789 Other speech disturbances: Secondary | ICD-10-CM | POA: Diagnosis not present

## 2014-02-04 DIAGNOSIS — I10 Essential (primary) hypertension: Secondary | ICD-10-CM | POA: Diagnosis present

## 2014-02-04 DIAGNOSIS — R5381 Other malaise: Secondary | ICD-10-CM | POA: Diagnosis present

## 2014-02-04 DIAGNOSIS — E119 Type 2 diabetes mellitus without complications: Secondary | ICD-10-CM

## 2014-02-04 DIAGNOSIS — F329 Major depressive disorder, single episode, unspecified: Secondary | ICD-10-CM

## 2014-02-04 DIAGNOSIS — I658 Occlusion and stenosis of other precerebral arteries: Secondary | ICD-10-CM | POA: Diagnosis not present

## 2014-02-04 DIAGNOSIS — N390 Urinary tract infection, site not specified: Secondary | ICD-10-CM | POA: Diagnosis present

## 2014-02-04 DIAGNOSIS — F411 Generalized anxiety disorder: Secondary | ICD-10-CM | POA: Diagnosis present

## 2014-02-04 DIAGNOSIS — Z833 Family history of diabetes mellitus: Secondary | ICD-10-CM | POA: Diagnosis not present

## 2014-02-04 DIAGNOSIS — Z8673 Personal history of transient ischemic attack (TIA), and cerebral infarction without residual deficits: Secondary | ICD-10-CM

## 2014-02-04 DIAGNOSIS — Z8249 Family history of ischemic heart disease and other diseases of the circulatory system: Secondary | ICD-10-CM

## 2014-02-04 DIAGNOSIS — F32A Depression, unspecified: Secondary | ICD-10-CM

## 2014-02-04 DIAGNOSIS — J9819 Other pulmonary collapse: Secondary | ICD-10-CM | POA: Diagnosis not present

## 2014-02-04 DIAGNOSIS — E1149 Type 2 diabetes mellitus with other diabetic neurological complication: Secondary | ICD-10-CM | POA: Diagnosis not present

## 2014-02-04 DIAGNOSIS — Z87891 Personal history of nicotine dependence: Secondary | ICD-10-CM

## 2014-02-04 DIAGNOSIS — R5383 Other fatigue: Secondary | ICD-10-CM | POA: Diagnosis not present

## 2014-02-04 DIAGNOSIS — N183 Chronic kidney disease, stage 3 unspecified: Secondary | ICD-10-CM | POA: Diagnosis present

## 2014-02-04 DIAGNOSIS — R109 Unspecified abdominal pain: Secondary | ICD-10-CM

## 2014-02-04 DIAGNOSIS — E1140 Type 2 diabetes mellitus with diabetic neuropathy, unspecified: Secondary | ICD-10-CM

## 2014-02-04 DIAGNOSIS — R918 Other nonspecific abnormal finding of lung field: Secondary | ICD-10-CM | POA: Diagnosis not present

## 2014-02-04 DIAGNOSIS — I059 Rheumatic mitral valve disease, unspecified: Secondary | ICD-10-CM | POA: Diagnosis not present

## 2014-02-04 DIAGNOSIS — R531 Weakness: Secondary | ICD-10-CM

## 2014-02-04 DIAGNOSIS — R404 Transient alteration of awareness: Secondary | ICD-10-CM | POA: Diagnosis not present

## 2014-02-04 LAB — ETHANOL: Alcohol, Ethyl (B): <11 mg/dL (ref 0–11)

## 2014-02-04 LAB — URINALYSIS, ROUTINE W REFLEX MICROSCOPIC
Bilirubin Urine: NEGATIVE
Glucose, UA: NEGATIVE mg/dL
Hgb urine dipstick: NEGATIVE
KETONES UR: NEGATIVE mg/dL
LEUKOCYTES UA: NEGATIVE
NITRITE: NEGATIVE
PH: 5.5 (ref 5.0–8.0)
Protein, ur: NEGATIVE mg/dL
SPECIFIC GRAVITY, URINE: 1.025 (ref 1.005–1.030)
Urobilinogen, UA: 0.2 mg/dL (ref 0.0–1.0)

## 2014-02-04 LAB — RAPID URINE DRUG SCREEN, HOSP PERFORMED
Amphetamines: NOT DETECTED
BARBITURATES: NOT DETECTED
Benzodiazepines: NOT DETECTED
Cocaine: NOT DETECTED
Opiates: NOT DETECTED
Tetrahydrocannabinol: NOT DETECTED

## 2014-02-04 LAB — DIFFERENTIAL
Basophils Absolute: 0.1 10*3/uL (ref 0.0–0.1)
Basophils Relative: 1 % (ref 0–1)
EOS PCT: 4 % (ref 0–5)
Eosinophils Absolute: 0.4 10*3/uL (ref 0.0–0.7)
LYMPHS ABS: 2.1 10*3/uL (ref 0.7–4.0)
LYMPHS PCT: 24 % (ref 12–46)
Monocytes Absolute: 0.8 10*3/uL (ref 0.1–1.0)
Monocytes Relative: 9 % (ref 3–12)
NEUTROS PCT: 62 % (ref 43–77)
Neutro Abs: 5.5 10*3/uL (ref 1.7–7.7)

## 2014-02-04 LAB — COMPREHENSIVE METABOLIC PANEL
ALK PHOS: 55 U/L (ref 39–117)
ALT: 19 U/L (ref 0–35)
AST: 21 U/L (ref 0–37)
Albumin: 3.7 g/dL (ref 3.5–5.2)
Anion gap: 13 (ref 5–15)
BILIRUBIN TOTAL: 0.3 mg/dL (ref 0.3–1.2)
BUN: 23 mg/dL (ref 6–23)
CO2: 25 meq/L (ref 19–32)
Calcium: 9.2 mg/dL (ref 8.4–10.5)
Chloride: 103 mEq/L (ref 96–112)
Creatinine, Ser: 1.16 mg/dL — ABNORMAL HIGH (ref 0.50–1.10)
GFR, EST AFRICAN AMERICAN: 48 mL/min — AB (ref >90)
GFR, EST NON AFRICAN AMERICAN: 41 mL/min — AB (ref >90)
GLUCOSE: 146 mg/dL — AB (ref 70–99)
POTASSIUM: 4.3 meq/L (ref 3.7–5.3)
Sodium: 141 mEq/L (ref 137–147)
Total Protein: 6.6 g/dL (ref 6.0–8.3)

## 2014-02-04 LAB — CBC
HCT: 37.4 % (ref 36.0–46.0)
HEMOGLOBIN: 12.7 g/dL (ref 12.0–15.0)
MCH: 31.2 pg (ref 26.0–34.0)
MCHC: 34 g/dL (ref 30.0–36.0)
MCV: 91.9 fL (ref 78.0–100.0)
PLATELETS: 178 10*3/uL (ref 150–400)
RBC: 4.07 MIL/uL (ref 3.87–5.11)
RDW: 12.4 % (ref 11.5–15.5)
WBC: 8.8 10*3/uL (ref 4.0–10.5)

## 2014-02-04 LAB — I-STAT CHEM 8, ED
BUN: 23 mg/dL (ref 6–23)
CREATININE: 1.2 mg/dL — AB (ref 0.50–1.10)
Calcium, Ion: 1.14 mmol/L (ref 1.13–1.30)
Chloride: 108 mEq/L (ref 96–112)
GLUCOSE: 144 mg/dL — AB (ref 70–99)
HCT: 37 % (ref 36.0–46.0)
Hemoglobin: 12.6 g/dL (ref 12.0–15.0)
POTASSIUM: 4.1 meq/L (ref 3.7–5.3)
Sodium: 138 mEq/L (ref 137–147)
TCO2: 22 mmol/L (ref 0–100)

## 2014-02-04 LAB — I-STAT TROPONIN, ED: Troponin i, poc: 0.01 ng/mL (ref 0.00–0.08)

## 2014-02-04 LAB — GLUCOSE, CAPILLARY: Glucose-Capillary: 165 mg/dL — ABNORMAL HIGH (ref 70–99)

## 2014-02-04 LAB — PROTIME-INR
INR: 1.01 (ref 0.00–1.49)
PROTHROMBIN TIME: 13.3 s (ref 11.6–15.2)

## 2014-02-04 LAB — APTT: aPTT: 28 seconds (ref 24–37)

## 2014-02-04 MED ORDER — INSULIN ASPART 100 UNIT/ML ~~LOC~~ SOLN
0.0000 [IU] | Freq: Three times a day (TID) | SUBCUTANEOUS | Status: DC
  Administered 2014-02-05: 1 [IU] via SUBCUTANEOUS

## 2014-02-04 MED ORDER — METOPROLOL SUCCINATE ER 25 MG PO TB24
25.0000 mg | ORAL_TABLET | Freq: Every day | ORAL | Status: DC
  Administered 2014-02-05: 25 mg via ORAL
  Filled 2014-02-04: qty 1

## 2014-02-04 MED ORDER — METRONIDAZOLE 500 MG PO TABS
500.0000 mg | ORAL_TABLET | Freq: Three times a day (TID) | ORAL | Status: DC
  Administered 2014-02-04 – 2014-02-05 (×2): 500 mg via ORAL
  Filled 2014-02-04 (×2): qty 1

## 2014-02-04 MED ORDER — SODIUM CHLORIDE 0.9 % IJ SOLN
3.0000 mL | Freq: Two times a day (BID) | INTRAMUSCULAR | Status: DC
  Administered 2014-02-04 – 2014-02-05 (×2): 3 mL via INTRAVENOUS

## 2014-02-04 MED ORDER — POLYETHYLENE GLYCOL 3350 17 G PO PACK: 17.0000 g | PACK | Freq: Every day | ORAL | Status: DC | PRN

## 2014-02-04 MED ORDER — HYOSCYAMINE SULFATE 0.125 MG SL SUBL: 0.1250 mg | SUBLINGUAL_TABLET | Freq: Three times a day (TID) | SUBLINGUAL | Status: DC | PRN

## 2014-02-04 MED ORDER — PANTOPRAZOLE SODIUM 40 MG PO TBEC
40.0000 mg | DELAYED_RELEASE_TABLET | Freq: Every day | ORAL | Status: DC
  Administered 2014-02-04 – 2014-02-05 (×2): 40 mg via ORAL
  Filled 2014-02-04 (×2): qty 1

## 2014-02-04 MED ORDER — ASPIRIN 81 MG PO CHEW
81.0000 mg | CHEWABLE_TABLET | Freq: Every day | ORAL | Status: DC
  Administered 2014-02-04 – 2014-02-05 (×2): 81 mg via ORAL
  Filled 2014-02-04 (×2): qty 1

## 2014-02-04 MED ORDER — STROKE: EARLY STAGES OF RECOVERY BOOK
Freq: Once | Status: DC
  Filled 2014-02-04: qty 1

## 2014-02-04 MED ORDER — HYDRALAZINE HCL 20 MG/ML IJ SOLN: 10.0000 mg | Freq: Four times a day (QID) | INTRAMUSCULAR | Status: DC | PRN

## 2014-02-04 MED ORDER — LORAZEPAM 2 MG/ML IJ SOLN: 1.0000 mg | Freq: Once | INTRAMUSCULAR | Status: AC | PRN

## 2014-02-04 MED ORDER — SIMVASTATIN 20 MG PO TABS: 40.0000 mg | ORAL_TABLET | Freq: Every day | ORAL | Status: DC

## 2014-02-04 MED ORDER — BUSPIRONE HCL 5 MG PO TABS
15.0000 mg | ORAL_TABLET | Freq: Two times a day (BID) | ORAL | Status: DC
  Administered 2014-02-04 – 2014-02-05 (×2): 15 mg via ORAL
  Filled 2014-02-04 (×2): qty 3

## 2014-02-04 MED ORDER — LOSARTAN POTASSIUM 50 MG PO TABS
100.0000 mg | ORAL_TABLET | Freq: Every day | ORAL | Status: DC
  Administered 2014-02-04 – 2014-02-05 (×2): 100 mg via ORAL
  Filled 2014-02-04 (×2): qty 2

## 2014-02-04 MED ORDER — CLONIDINE HCL 0.1 MG PO TABS
0.1000 mg | ORAL_TABLET | Freq: Two times a day (BID) | ORAL | Status: DC
  Administered 2014-02-04 – 2014-02-05 (×2): 0.1 mg via ORAL
  Filled 2014-02-04 (×2): qty 1

## 2014-02-04 MED ORDER — CITALOPRAM HYDROBROMIDE 20 MG PO TABS
40.0000 mg | ORAL_TABLET | Freq: Every day | ORAL | Status: DC
  Administered 2014-02-05: 40 mg via ORAL
  Filled 2014-02-04: qty 2

## 2014-02-04 MED ORDER — ONDANSETRON HCL 4 MG PO TABS: 4.0000 mg | ORAL_TABLET | Freq: Four times a day (QID) | ORAL | Status: DC | PRN

## 2014-02-04 MED ORDER — SODIUM CHLORIDE 0.9 % IV SOLN
INTRAVENOUS | Status: AC
  Administered 2014-02-04: 18:00:00 via INTRAVENOUS

## 2014-02-04 MED ORDER — LORATADINE 10 MG PO TABS
10.0000 mg | ORAL_TABLET | Freq: Every day | ORAL | Status: DC
  Administered 2014-02-04 – 2014-02-05 (×2): 10 mg via ORAL
  Filled 2014-02-04 (×2): qty 1

## 2014-02-04 MED ORDER — HEPARIN SODIUM (PORCINE) 5000 UNIT/ML IJ SOLN
5000.0000 [IU] | Freq: Three times a day (TID) | INTRAMUSCULAR | Status: DC
  Administered 2014-02-04 – 2014-02-05 (×2): 5000 [IU] via SUBCUTANEOUS
  Filled 2014-02-04 (×2): qty 1

## 2014-02-04 MED ORDER — ONDANSETRON HCL 4 MG/2ML IJ SOLN: 4.0000 mg | Freq: Four times a day (QID) | INTRAMUSCULAR | Status: DC | PRN

## 2014-02-04 MED ORDER — GUAIFENESIN-DM 100-10 MG/5ML PO SYRP: 5.0000 mL | ORAL_SOLUTION | ORAL | Status: DC | PRN

## 2014-02-04 MED ORDER — ALUM & MAG HYDROXIDE-SIMETH 200-200-20 MG/5ML PO SUSP: 30.0000 mL | Freq: Four times a day (QID) | ORAL | Status: DC | PRN

## 2014-02-04 MED ORDER — INSULIN ASPART 100 UNIT/ML ~~LOC~~ SOLN: 0.0000 [IU] | Freq: Every day | SUBCUTANEOUS | Status: DC

## 2014-02-04 MED ORDER — GLIPIZIDE 5 MG PO TABS
2.5000 mg | ORAL_TABLET | Freq: Every morning | ORAL | Status: DC
  Administered 2014-02-05: 2.5 mg via ORAL
  Filled 2014-02-04: qty 1

## 2014-02-04 MED ORDER — MECLIZINE HCL 12.5 MG PO TABS: 12.5000 mg | ORAL_TABLET | Freq: Two times a day (BID) | ORAL | Status: DC | PRN

## 2014-02-04 NOTE — H&P (Signed)
Patient Demographics  Meghan Duran, is a 80 y.o. female  MRN: [id number]   DOB - 1924-01-09  Admit Date - 02/04/2014  Outpatient Primary MD for the patient is [name], MD   With History of -  Past Medical History  Diagnosis Date  . Hypertension   . Cancer   . Pernicious anemia   . Neuropathy     Feet and legs  . Diabetes mellitus without complication   . Depression 09/22/2013  . Anxiety 09/22/2013  . GERD (gastroesophageal reflux disease) 09/22/2013  . HTN (hypertension) 09/22/2013  . Thyroid nodule       Past Surgical History  Procedure Laterality Date  . Abdominal hysterectomy    . Cholecystectomy    . Appendectomy    . Bladder surgery    . Rectal surgery      in for   Chief Complaint  Patient presents with  . Weakness     HPI  Meghan Duran  is a 80 y.o. female, history of diabetes mellitus type 2 not on insulin, hypertension, dyslipidemia, CTD stage III baseline creatinine around 1.3 ,GERD, thyroid nodule, malignancy in her tonsils per patient, depression, TIA , lives in assisted living facility comes into the ER with chief complaints of weakness somewhat on the right side more than left, but finding difficulty which happened earlier today several hours ago in 3 different intermittent episodes.  She says that for the last day or so she has had some mild left lower quadrant pain with some diarrhea which she has had in the past with mild diverticulitis, since this morning she was feeling weak in both legs, When she went out to have lunch she had some problems using her silverware with the right hand, she also had 2 episodes of word finding difficulties both lasting 10 minutes half an hour apart, came to the ER where her symptoms had completely resolved. Chest x-ray head CT and lab work were  unremarkable. I was called to admit the patient for TIA workup.   Review of Systems    In addition to the HPI above, No Fever-chills, No Headache, No changes with Vision or hearing, No problems swallowing food or Liquids, No Chest pain, Cough or Shortness of Breath, Mild left lower quadrant Abdominal pain, No Nausea or Vommitting, Bowel movements are loose, No Blood in stool or Urine, No dysuria, No new skin rashes or bruises, No new joints pains-aches,  No new weakness, tingling, numbness in any extremity, currently none except mild generalized lower extremity weakness bilaterally No recent weight gain or loss, No polyuria, polydypsia or polyphagia, No significant Mental Stressors.  A full 10 point Review of Systems was done, except as stated above, all other Review of Systems were negative.   Social History History  Substance Use Topics  . Smoking status: Former Smoker    Quit date: 11/20/1985  . Smokeless tobacco: Not on file     Comment: Over 30 years ago  .  Alcohol Use: No      Family History Family History  Problem Relation Age of Onset  . Diabetes Sister   . Heart attack Sister       Prior to Admission medications   Medication Sig Start Date End Date Taking? Authorizing Provider  acetaminophen (TYLENOL EX ST ARTHRITIS PAIN) 500 MG tablet Take 500 mg by mouth every 6 (six) hours as needed for mild pain or moderate pain.   Yes Historical Provider, MD  busPIRone (BUSPAR) 15 MG tablet Take 15 mg by mouth 2 (two) times daily.   Yes Historical Provider, MD  citalopram (CELEXA) 40 MG tablet Take 40 mg by mouth daily.   Yes Historical Provider, MD  cloNIDine (CATAPRES) 0.1 MG tablet Take 1 tablet (0.1 mg total) by mouth 2 (two) times daily. 01/07/14  Yes [name], MD  glipiZIDE (GLUCOTROL) 5 MG tablet Take 2.5 mg by mouth every morning.   Yes Historical Provider, MD  hyoscyamine (LEVSIN SL) 0.125 MG SL tablet Place 0.125 mg under the tongue 3 (three) times daily as  needed for cramping.   Yes Historical Provider, MD  loratadine (CLARITIN) 10 MG tablet Take 1 tablet (10 mg total) by mouth daily. 09/26/13  Yes [name], MD  losartan (COZAAR) 100 MG tablet Take 100 mg by mouth daily.   Yes Historical Provider, MD  meclizine (ANTIVERT) 12.5 MG tablet Take 12.5 mg by mouth 2 (two) times daily as needed for dizziness or nausea.    Yes Historical Provider, MD  metFORMIN (GLUCOPHAGE) 500 MG tablet Take 500-750 mg by mouth 2 (two) times daily. Take one tablet in the morning and one and one-half tablet at supper   Yes Historical Provider, MD  metoprolol succinate (TOPROL-XL) 25 MG 24 hr tablet Take 25 mg by mouth daily.   Yes Historical Provider, MD  omeprazole (PRILOSEC) 20 MG capsule Take 20 mg by mouth daily as needed (for acid reflux).   Yes Historical Provider, MD  pravastatin (PRAVACHOL) 40 MG tablet Take 1 tablet (40 mg total) by mouth every evening. 05/27/13 05/27/14 Yes [name], MD  fluticasone (FLONASE) 50 MCG/ACT nasal spray Place 1 spray into the nose daily. 11/20/12 11/20/13  [name], MD    Allergies  Allergen Reactions  . Amoxil [Amoxicillin] Other (See Comments)    Unknown reaction  . Cymbalta [Duloxetine Hcl] Other (See Comments)    Unknown Side effects  . Macrobid [Nitrofurantoin Macrocrystal]     Diarrhea and swollen lips  . Tramadol Other (See Comments)    dizzy    Physical Exam  Vitals  Blood pressure 181/65, pulse 82, temperature 98.4 F (36.9 C), temperature source Oral, resp. rate 13, SpO2 98.00%.   1. General obese elderly white female lying in bed in NAD,    2. mildly anxious affect and insight, Not Suicidal or Homicidal, Awake Alert, Oriented X 3.  3. No F.N deficits, ALL C.Nerves Intact, Strength 5/5 all 4 extremities, Sensation intact all 4 extremities, Plantars down going.  4. Ears and Eyes appear Normal, Conjunctivae clear, PERRLA. Moist Oral Mucosa.  5. Supple Neck, No JVD, No cervical lymphadenopathy  appriciated, No Carotid Bruits.  6. Symmetrical Chest wall movement, Good air movement bilaterally, CTAB.  7. RRR, No Gallops, Rubs or Murmurs, No Parasternal Heave.  8. Positive Bowel Sounds, Abdomen Soft, mild left lower quadrant tenderness, No organomegaly appriciated,No rebound -guarding or rigidity.  9.  No Cyanosis, Normal Skin Turgor, No Skin Bruise. Does have a  diffuse papular rash all over her body for which she is following with the dermatologist  10. Good muscle tone,  joints appear normal , no effusions, Normal ROM.  11. No Palpable Lymph Nodes in Neck or Axillae    Data Review  CBC  Recent Labs Lab 02/04/14 1520 02/04/14 1526  WBC 8.8  --   HGB 12.7 12.6  HCT 37.4 37.0  PLT 178  --   MCV 91.9  --   MCH 31.2  --   MCHC 34.0  --   RDW 12.4  --   LYMPHSABS 2.1  --   MONOABS 0.8  --   EOSABS 0.4  --   BASOSABS 0.1  --    ------------------------------------------------------------------------------------------------------------------  Chemistries   Recent Labs Lab 02/04/14 1520 02/04/14 1526  NA 141 138  K 4.3 4.1  CL 103 108  CO2 25  --   GLUCOSE 146* 144*  BUN 23 23  CREATININE 1.16* 1.20*  CALCIUM 9.2  --   AST 21  --   ALT 19  --   ALKPHOS 55  --   BILITOT 0.3  --    ------------------------------------------------------------------------------------------------------------------ CrCl is unknown because both a height and weight (above a minimum accepted value) are required for this calculation. ------------------------------------------------------------------------------------------------------------------ No results found for this basename: TSH, T4TOTAL, FREET3, T3FREE, THYROIDAB,  in the last 72 hours   Coagulation profile  Recent Labs Lab 02/04/14 1520  INR 1.01   ------------------------------------------------------------------------------------------------------------------- No results found for this basename: DDIMER,  in the  last 72 hours -------------------------------------------------------------------------------------------------------------------  Cardiac Enzymes No results found for this basename: CK, CKMB, TROPONINI, MYOGLOBIN,  in the last 168 hours ------------------------------------------------------------------------------------------------------------------ No components found with this basename: POCBNP,    ---------------------------------------------------------------------------------------------------------------  Urinalysis    Component Value Date/Time   COLORURINE YELLOW 02/04/2014 [place] 02/04/2014 1605   LABSPEC 1.025 02/04/2014 1605   PHURINE 5.5 02/04/2014 [place] 02/04/2014 [place] 02/04/2014 [place] 02/04/2014 1605   BILIRUBINUR ++ 12/10/2013 [place] 02/04/2014 [place] 02/04/2014 1605   PROTEINUR 100 10/15/2013 1511   UROBILINOGEN 0.2 02/04/2014 1605   NITRITE NEGATIVE 02/04/2014 [place] 02/04/2014 1605    ----------------------------------------------------------------------------------------------------------------  Imaging results:   Dg Chest 1 View  02/04/2014   CLINICAL DATA:  Weakness.  EXAM: CHEST - 1 VIEW  COMPARISON:  None.  FINDINGS: Mediastinum hilar structures are normal. Mild basilar atelectasis. Lower portions of both lungs not imaged. Heart size normal. No pleural effusion or pneumothorax. Degenerative changes both shoulders.  IMPRESSION: Mild basilar atelectasis. Lower portion of both lungs noted image. No acute cardiopulmonary disease otherwise noted. Standard PA and lateral chest x-ray can be obtained to further evaluate.   Electronically Signed   By: Marcello Moores  Register   On: 02/04/2014 16:26   Ct Head Wo Contrast  02/04/2014   CLINICAL DATA:  Weakness  EXAM: CT HEAD WITHOUT CONTRAST  TECHNIQUE: Contiguous axial images were obtained from the base of the skull  through the vertex without intravenous contrast.  COMPARISON:  12/04/2013  FINDINGS: There is no evidence of mass effect, midline shift, or extra-axial fluid collections. There is no evidence of a space-occupying lesion or intracranial hemorrhage. There is no evidence of a cortical-based area of acute infarction. There is generalized cerebral atrophy. There is periventricular white matter low attenuation likely secondary to microangiopathy.  The ventricles and sulci are appropriate for  the patient's age. The basal cisterns are patent.  Visualized portions of the orbits are unremarkable. The visualized portions of the paranasal sinuses and mastoid air cells are unremarkable. Cerebrovascular atherosclerotic calcifications are noted.  The osseous structures are unremarkable.  IMPRESSION: No acute intracranial pathology.   Electronically Signed   By: Kathreen Devoid   On: 02/04/2014 16:16    My personal review of EKG: Rhythm NSR, no Acute ST changes    Assessment & Plan   1. Right-sided weakness and word finding difficulty. Could be mild TIA, symptoms completely resolved, head CT unremarkable, will admit her to a telemetry bed, full stroke workup which will include MRI MRA brain, carotid duplex, echogram, A1c, lipid panel, evaluation by PT-OT and speech. Place her on aspirin 81 mg. Neurology consultation.   2. Mild left lower quadrant abdominal pain and loose stools for the last 1-2 days. History of diverticulitis, no fever or leukocytosis, soft diet once she passes her speech evaluation, Flagyl orally for now. Monitor clinically.   3. DM type II. Check A1c, continue oral Glucotrol, add sliding scale.   4. Dyslipidemia. Continue statin. Check lipid panel.   5. Hypertension. Continue beta blocker, continue ARB, continue Catapres, as needed IV hydralazine.   6. Depression anxiety. Continue Celexa and BuSpar. If needed IV Ativan x1 during MRI.    7. CK D. stage III. Creatinine appears to be at  baseline, since she had diarrhea we'll gently hydrate her for 15 hours.      DVT Prophylaxis Heparin   AM Labs Ordered, also please review Full Orders  Family Communication: Admission, patients condition and plan of care including tests being ordered have been discussed with the patient and daughter POA who indicate understanding and agree with the plan and Code Status.  Code Status DNR  Likely DC to ALF  Condition GUARDED     Time spent in minutes : 35    SINGH,PRASHANT K M.D on 02/04/2014 at 5:40 PM  Between 7am to 7pm - Pager - [phone]  After 7pm go to [email or web address] - password [email or web address]  And look for the night coverage person covering me after hours  [facility] Office  [phone]   **Disclaimer: This note may have been dictated with voice recognition software. Similar sounding words can inadvertently be transcribed and this note may contain transcription errors which may not have been corrected upon publication of note.**

## 2014-02-04 NOTE — ED Notes (Signed)
Pt comes from [facility] via EMS after staff called out due to weakness in lower extremities as well as "trouble speaking". Symptoms began approximately at noon and EMS was called at 1400. Upon EMS arrival symptoms had subsided and pt was A&Ox4 and only complaining of "slight headache". Pt was ambulatory from EMS stretcher to ED bed upon arrival. Pt states she had a stroke in March of this year and is staying at [facility] "while recovering".

## 2014-02-04 NOTE — ED Provider Notes (Signed)
CSN: [id number]     Arrival date & time 02/04/14  1430 History  This chart was scribed for Meghan Pollack, MD by Irene Pap, ED Scribe. This patient was seen in room APA06/APA06 and patient care was started at 2:48 PM.    Chief Complaint  Patient presents with  . Weakness   Patient is a 80 y.o. female presenting with weakness. The history is provided by the patient. No language interpreter was used.  Weakness  HPI Comments: Meghan Duran is a 80 y.o. female with a history of neuropathy and DM who presents to the Emergency Department complaining of weakness onset 2 hours ago. She states that she was eating her lunch when she started shaking. She states that the symptoms were most pronounced when she was sitting but states that she started feeling weak on her way to lunch. She states that no one was with her prior to lunch. She states that she lost her balance after standing. She reports associated trouble word finding, frequency, and immediately had to have a bowel movement, and has had 3 since. She reports the bowel movements as loose, but denies melena or hematochezia. Her daughter reports that diarrhea is not an uncommon thing for her. She states she had a stroke recently. She states that she lives in assisted living for a few months out of the year and lives with her family at other times. She states that she has her home still in [place], [place]. She reports that she was seen for TIA 4 months ago and was given a CT scan. She states Dr. Nicki Reaper is her PCP. Patient denies a change in her DM medications and states that she is right handed.   Past Medical History  Diagnosis Date  . Hypertension   . Cancer   . Pernicious anemia   . Neuropathy     Feet and legs  . Diabetes mellitus without complication   . Depression 09/22/2013  . Anxiety 09/22/2013  . GERD (gastroesophageal reflux disease) 09/22/2013  . HTN (hypertension) 09/22/2013  . Thyroid nodule    Past Surgical History  Procedure  Laterality Date  . Abdominal hysterectomy    . Cholecystectomy    . Appendectomy    . Bladder surgery    . Rectal surgery     Family History  Problem Relation Age of Onset  . Diabetes Sister   . Heart attack Sister    History  Substance Use Topics  . Smoking status: Former Smoker    Quit date: 11/20/1985  . Smokeless tobacco: Not on file     Comment: Over 30 years ago  . Alcohol Use: No   OB History   Grav Para Term Preterm Abortions TAB SAB Ect Mult Living                 Review of Systems  Gastrointestinal: Positive for diarrhea. Negative for vomiting and blood in stool.  Genitourinary: Positive for frequency. Negative for dysuria.  Neurological: Positive for dizziness and weakness.  All other systems reviewed and are negative.  Allergies  Amoxil; Cymbalta; Macrobid; and Tramadol  Home Medications   Prior to Admission medications   Medication Sig Start Date End Date Taking? Authorizing Provider  aspirin EC 81 MG EC tablet Take 1 tablet (81 mg total) by mouth daily. 09/23/13   Radene Gunning, NP  ciprofloxacin (CIPRO) 250 MG tablet Take 1 tablet (250 mg total) by mouth 2 (two) times daily. For 5 days 01/20/14  [name], MD  cloNIDine (CATAPRES) 0.1 MG tablet Take 1 tablet (0.1 mg total) by mouth 2 (two) times daily. 01/07/14   [name], MD  fluticasone (FLONASE) 50 MCG/ACT nasal spray Place 1 spray into the nose daily. 11/20/12 11/20/13  [name], MD  glipiZIDE (GLUCOTROL) 5 MG tablet TAKE 1/2 TABLET in the am 01/07/14   [name], MD  hyoscyamine (LEVSIN SL) 0.125 MG SL tablet TAKE ONE TABLET UNDER THE TONGUE THREE TIMES DAILY AS NEEDED FOR CRAMPING 11/11/13   [name], MD  loratadine (CLARITIN) 10 MG tablet Take 1 tablet (10 mg total) by mouth daily. 09/26/13   [name], MD  losartan (COZAAR) 100 MG tablet TAKE ONE TABLET BY MOUTH ONCE DAILY 06/23/13   [name], MD  meclizine (ANTIVERT) 12.5 MG tablet TAKE ONE TABLET BY MOUTH TWICE DAILY  AS NEEDED    [name], MD  metFORMIN (GLUCOPHAGE) 500 MG tablet 1 in am then 1.5 at supper 11/11/13   [name], MD  metoprolol succinate (TOPROL-XL) 25 MG 24 hr tablet TAKE ONE TABLET BY MOUTH ONCE DAILY    [name], MD  oxyCODONE-acetaminophen (PERCOCET/ROXICET) 5-325 MG per tablet Take 1 tablet by mouth every 4 (four) hours as needed for severe pain. 12/04/13   Virgel Manifold, MD  pantoprazole (PROTONIX) 40 MG tablet Take 1 tablet (40 mg total) by mouth daily. 03/25/13 03/25/14  [name], MD  pravastatin (PRAVACHOL) 40 MG tablet Take 1 tablet (40 mg total) by mouth every evening. 05/27/13 05/27/14  [name], MD  TRUETRACK TEST test strip TEST ONCE A DAY 01/15/14   [name], MD   BP 186/76  Pulse 79  Temp(Src) 98 F (36.7 C) (Oral)  Resp 16  SpO2 98%  Physical Exam  Nursing note and vitals reviewed. Constitutional: She is oriented to person, place, and time. She appears well-developed and well-nourished.  HENT:  Head: Normocephalic and atraumatic.  Right Ear: Tympanic membrane and external ear normal.  Left Ear: Tympanic membrane and external ear normal.  Nose: Nose normal. Right sinus exhibits no maxillary sinus tenderness and no frontal sinus tenderness. Left sinus exhibits no maxillary sinus tenderness and no frontal sinus tenderness.  Mouth/Throat: Oropharynx is clear and moist.  Eyes: Conjunctivae and EOM are normal. Pupils are equal, round, and reactive to light. Right eye exhibits no nystagmus. Left eye exhibits no nystagmus.  Neck: Normal range of motion. Neck supple.  Cardiovascular: Normal rate, regular rhythm, normal heart sounds and intact distal pulses.   Pulmonary/Chest: Effort normal and breath sounds normal. No respiratory distress. She exhibits no tenderness.  Abdominal: Soft. Bowel sounds are normal. She exhibits no distension and no mass. There is no tenderness.  Musculoskeletal: Normal range of motion. She exhibits no edema and no  tenderness.  Neurological: She is alert and oriented to person, place, and time. She has normal strength and normal reflexes. No sensory deficit. She displays a negative Romberg sign. GCS eye subscore is 4. GCS verbal subscore is 5. GCS motor subscore is 6.  Reflex Scores:      Tricep reflexes are 2+ on the right side and 2+ on the left side.      Bicep reflexes are 2+ on the right side and 2+ on the left side.      Brachioradialis reflexes are 2+ on the right side and 2+ on the left side.      Patellar reflexes  are 2+ on the right side and 2+ on the left side.      Achilles reflexes are 2+ on the right side and 2+ on the left side. Speech is normal without dysarthria, dysphasia, or aphasia. Muscle strength is 5/5 in bilateral shoulders, elbow flexor and extensors, wrist flexor and extensors, and intrinsic hand muscles. 5/5 bilateral lower extremity hip flexors, extensors, knee flexors and extensors, and ankle dorsi and plantar flexors.    Skin: Skin is warm and dry. No rash noted.  Psychiatric: She has a normal mood and affect. Her behavior is normal. Judgment and thought content normal.    ED Course  Procedures (including critical care time) DIAGNOSTIC STUDIES: Oxygen Saturation is 98% on room air, normal by my interpretation.    COORDINATION OF CARE: 2:40 PM-Discussed treatment plan which includes  (CXR, CBC panel, CMP, UA) with pt at bedside and pt agreed to plan.     Labs Review Labs Reviewed  I-STAT CHEM 8, ED - Abnormal; Notable for the following:    Creatinine, Ser 1.20 (*)    Glucose, Bld 144 (*)    All other components within normal limits  ETHANOL  PROTIME-INR  APTT  CBC  DIFFERENTIAL  COMPREHENSIVE METABOLIC PANEL  URINE RAPID DRUG SCREEN (HOSP PERFORMED)  URINALYSIS, ROUTINE W REFLEX MICROSCOPIC  I-STAT TROPOININ, ED  Randolm Idol, ED   Imaging Review Dg Chest 1 View  02/04/2014   CLINICAL DATA:  Weakness.  EXAM: CHEST - 1 VIEW  COMPARISON:  None.   FINDINGS: Mediastinum hilar structures are normal. Mild basilar atelectasis. Lower portions of both lungs not imaged. Heart size normal. No pleural effusion or pneumothorax. Degenerative changes both shoulders.  IMPRESSION: Mild basilar atelectasis. Lower portion of both lungs noted image. No acute cardiopulmonary disease otherwise noted. Standard PA and lateral chest x-Shandrea Lusk can be obtained to further evaluate.   Electronically Signed   By: Marcello Moores  Register   On: 02/04/2014 16:26   Ct Head Wo Contrast  02/04/2014   CLINICAL DATA:  Weakness  EXAM: CT HEAD WITHOUT CONTRAST  TECHNIQUE: Contiguous axial images were obtained from the base of the skull through the vertex without intravenous contrast.  COMPARISON:  12/04/2013  FINDINGS: There is no evidence of mass effect, midline shift, or extra-axial fluid collections. There is no evidence of a space-occupying lesion or intracranial hemorrhage. There is no evidence of a cortical-based area of acute infarction. There is generalized cerebral atrophy. There is periventricular white matter low attenuation likely secondary to microangiopathy.  The ventricles and sulci are appropriate for the patient's age. The basal cisterns are patent.  Visualized portions of the orbits are unremarkable. The visualized portions of the paranasal sinuses and mastoid air cells are unremarkable. Cerebrovascular atherosclerotic calcifications are noted.  The osseous structures are unremarkable.  IMPRESSION: No acute intracranial pathology.   Electronically Signed   By: Kathreen Devoid   On: 02/04/2014 16:16     EKG Interpretation   Date/Time:  Wednesday February 04 2014 14:33:31 EDT Ventricular Rate:  79 PR Interval:  191 QRS Duration: 74 QT Interval:  389 QTC Calculation: 446 R Axis:   38 Text Interpretation:  Normal sinus rhythm Low voltage QRS Confirmed by Santina Trillo  MD, Donice Alperin ([id number]) on 02/04/2014 2:36:40 PM    mra from previous event  Atherosclerotic type changes carotid bifurcation  bilaterally with less than 50% diameter stenosis involving the proximal internal carotid artery bilaterally. Narrowing and irregularity greater on the right.   High-grade stenosis proximal right external  carotid artery.   Mild narrowing proximal left external carotid artery.   Mild narrowing proximal common carotid artery bilaterally.   Loss of signal proximal vertebral arteries may be related to artifact although true stenosis not able to be excluded. Beyond this region, no significant narrowing of the vertebral arteries.   Mild to moderate narrowing proximal left subclavian artery.     MDM   Final diagnoses:  Transient cerebral ischemia, unspecified transient cerebral ischemia type    Patient with right sided weakness and speech difficulties beginning at least 3 hours pte.  Patient not felt to be tpa candidate as symptoms have resolved on my evaluation.   Atherosclerotic type changes carotid bifurcation bilaterally with less than 50% diameter stenosis involving the proximal internal carotid artery bilaterally. Narrowing and irregularity greater on the right.   High-grade stenosis proximal right external carotid artery.   Mild narrowing proximal left external carotid artery.   Mild narrowing proximal common carotid artery bilaterally.   Loss of signal proximal vertebral arteries may be related to artifact although true stenosis not able to be excluded. Beyond this region, no significant narrowing of the vertebral arteries.   Mild to moderate narrowing proximal left subclavian artery.     80 year old female with TIA a with ABCD to score 7 plan discussing hospitalization with hospitalist.  I personally performed the services described in this documentation, which was scribed in my presence. The recorded information has been reviewed and considered.    Meghan Pollack, MD 02/04/14 2226

## 2014-02-04 NOTE — Telephone Encounter (Signed)
FYI  Pt left today by EMS to [facility] around 2 pm  Was sent due to being confused, slurred speech, dropped her fork  And was unable to continue to eat. As well as a history of strokes for  The patient

## 2014-02-05 ENCOUNTER — Inpatient Hospital Stay (HOSPITAL_COMMUNITY): Payer: Medicare Other

## 2014-02-05 DIAGNOSIS — I059 Rheumatic mitral valve disease, unspecified: Secondary | ICD-10-CM

## 2014-02-05 DIAGNOSIS — E119 Type 2 diabetes mellitus without complications: Secondary | ICD-10-CM

## 2014-02-05 DIAGNOSIS — E1149 Type 2 diabetes mellitus with other diabetic neurological complication: Secondary | ICD-10-CM

## 2014-02-05 DIAGNOSIS — E1142 Type 2 diabetes mellitus with diabetic polyneuropathy: Secondary | ICD-10-CM

## 2014-02-05 LAB — LIPID PANEL
CHOL/HDL RATIO: 3.2 ratio
CHOLESTEROL: 174 mg/dL (ref 0–200)
HDL: 54 mg/dL (ref >39)
LDL Cholesterol: 79 mg/dL (ref 0–99)
TRIGLYCERIDES: 204 mg/dL — AB (ref <150)
VLDL: 41 mg/dL — AB (ref 0–40)

## 2014-02-05 LAB — BASIC METABOLIC PANEL
Anion gap: 10 (ref 5–15)
BUN: 20 mg/dL (ref 6–23)
CALCIUM: 8.7 mg/dL (ref 8.4–10.5)
CO2: 27 mEq/L (ref 19–32)
CREATININE: 1.12 mg/dL — AB (ref 0.50–1.10)
Chloride: 105 mEq/L (ref 96–112)
GFR calc Af Amer: 50 mL/min — ABNORMAL LOW (ref >90)
GFR, EST NON AFRICAN AMERICAN: 43 mL/min — AB (ref >90)
Glucose, Bld: 140 mg/dL — ABNORMAL HIGH (ref 70–99)
Potassium: 4.4 mEq/L (ref 3.7–5.3)
Sodium: 142 mEq/L (ref 137–147)

## 2014-02-05 LAB — CBC
HCT: 35.5 % — ABNORMAL LOW (ref 36.0–46.0)
Hemoglobin: 11.9 g/dL — ABNORMAL LOW (ref 12.0–15.0)
MCH: 30.8 pg (ref 26.0–34.0)
MCHC: 33.5 g/dL (ref 30.0–36.0)
MCV: 92 fL (ref 78.0–100.0)
PLATELETS: 166 10*3/uL (ref 150–400)
RBC: 3.86 MIL/uL — ABNORMAL LOW (ref 3.87–5.11)
RDW: 12.5 % (ref 11.5–15.5)
WBC: 5.8 10*3/uL (ref 4.0–10.5)

## 2014-02-05 LAB — GLUCOSE, CAPILLARY
Glucose-Capillary: 150 mg/dL — ABNORMAL HIGH (ref 70–99)
Glucose-Capillary: 108 mg/dL — ABNORMAL HIGH (ref 70–99)

## 2014-02-05 LAB — MRSA PCR SCREENING: MRSA by PCR: NEGATIVE

## 2014-02-05 LAB — HEMOGLOBIN A1C
Hgb A1c MFr Bld: 6.2 % — ABNORMAL HIGH (ref <5.7)
Hgb A1c MFr Bld: 6.4 % — ABNORMAL HIGH (ref <5.7)
MEAN PLASMA GLUCOSE: 131 mg/dL — AB (ref <117)
MEAN PLASMA GLUCOSE: 137 mg/dL — AB (ref <117)

## 2014-02-05 MED ORDER — METRONIDAZOLE 500 MG PO TABS
500.0000 mg | ORAL_TABLET | Freq: Three times a day (TID) | ORAL | Status: DC
Start: 2014-02-05 — End: 2014-03-11

## 2014-02-05 MED ORDER — ASPIRIN 81 MG PO CHEW: 81.0000 mg | CHEWABLE_TABLET | Freq: Every day | ORAL | Status: DC

## 2014-02-05 MED ORDER — ACETAMINOPHEN 325 MG PO TABS
650.0000 mg | ORAL_TABLET | Freq: Four times a day (QID) | ORAL | Status: DC | PRN
  Administered 2014-02-05: 650 mg via ORAL
  Filled 2014-02-05: qty 2

## 2014-02-05 NOTE — Evaluation (Signed)
Occupational Therapy Evaluation Patient Details Name: Meghan Duran MRN: [id number] DOB: 1923-08-21 Today's Date: 02/05/2014    History of Present Illness Meghan Duran  is a 80 y.o. female, history of diabetes mellitus type 2 not on insulin, hypertension, dyslipidemia, CTD stage III baseline creatinine around 1.3 ,GERD, thyroid nodule, malignancy in her tonsils per patient, depression, TIA , lives in assisted living facility comes into the ER with chief complaints of weakness somewhat on the right side more than left, but finding difficulty which happened earlier today several hours ago in 3 different intermittent episodes.  She says that for the last day or so she has had some mild left lower quadrant pain with some diarrhea which she has had in the past with mild diverticulitis, since this morning she was feeling weak in both legs, When she went out to have lunch she had some problems using her silverware with the right hand, she also had 2 episodes of word finding difficulties both lasting 10 minutes half an hour apart, came to the ER where her symptoms had completely resolved. Chest x-ray head CT and lab work were unremarkable. I was called to admit the patient for TIA workup.    Clinical Impression   Pt is presenting to acute OT with above situation.  She is currently not presenting with any acute strength, ROM, or sensation deficits.  Pt demonstrates good skills with grooming, dressing, and eating ADLs. Pt verbalizes feeling at baseline and verbalizes no concerns about d/c back to ALF.  Pt needs no further OT services at this time.  Acute OT to sign off.    Follow Up Recommendations  No OT follow up    Equipment Recommendations  None recommended by OT    Recommendations for Other Services       Precautions / Restrictions Precautions Precautions: Fall Precaution Comments: Possible TIA Restrictions Weight Bearing Restrictions: No      Mobility Bed Mobility Overal bed mobility:  Modified Independent              Transfers                  Balance Overall balance assessment: No apparent balance deficits (not formally assessed)                                          ADL Overall ADL's : At baseline;Modified independent                                       General ADL Comments: Pt demonstrates good skills with ADL needs, and verbalizes feeling at baseline with skills.     Vision                     Perception     Praxis      Pertinent Vitals/Pain      Hand Dominance Right   Extremity/Trunk Assessment Upper Extremity Assessment Upper Extremity Assessment: Overall WFL for tasks assessed (No acute coordination or sensation deficits noted. Hx peripheral neuropathy.)   Lower Extremity Assessment Lower Extremity Assessment: Defer to PT evaluation RLE Deficits / Details: MMT hip 4+/5, knee/ankle 5/5.  No coordination deficiets noted in Rt LE.  LLE Deficits / Details: MMT hip 4+/5, knee/ankle 5/5.  No coordination deficiets noted in Lt  LE.        Communication Communication Communication: No difficulties   Cognition Arousal/Alertness: Awake/alert Behavior During Therapy: WFL for tasks assessed/performed Overall Cognitive Status: Within Functional Limits for tasks assessed                     General Comments       Exercises       Shoulder Instructions      Home Living Family/patient expects to be discharged to:: Assisted living     Type of Home: Assisted living             Bathroom Shower/Tub: Walk-in shower   Bathroom Toilet: Handicapped height     [place]: Grab bars - tub/shower;Grab bars - toilet;Bedside commode;Shower seat - built in;Cane - single point;Walker - 4 wheels          Prior Functioning/Environment Level of Independence: Independent with assistive device(s)        Comments: Pt lives in assisted living, and community provides services  for food prerp, cleaning, and laundry.  pt is able to complete all ADLS with modified independence.    OT Diagnosis:     OT Problem List:     OT Treatment/Interventions:      OT Goals(Current goals can be found in the care plan section) Acute Rehab OT Goals Patient Stated Goal: No OT goals needed OT Goal Formulation: With patient  OT Frequency:     Barriers to D/C:            Co-evaluation              End of Session    Activity Tolerance: Patient tolerated treatment well Patient left: in bed;with call bell/phone within reach;with bed alarm set   Time: 0902-0922 OT Time Calculation (min): 20 min Charges:  OT General Charges $OT Visit: 1 Procedure OT Evaluation $Initial OT Evaluation Tier I: 1 Procedure G-Codes:     Meghan Graff, MS, OTR/L [phone]  02/05/2014, 9:27 AM

## 2014-02-05 NOTE — Evaluation (Signed)
Physical Therapy Evaluation Patient Details Name: Meghan Duran MRN: [id number] DOB: 30-Jun-1924 Today's Date: 02/05/2014   History of Present Illness  Meghan Duran  is a 80 y.o. female, history of diabetes mellitus type 2 not on insulin, hypertension, dyslipidemia, CTD stage III baseline creatinine around 1.3 ,GERD, thyroid nodule, malignancy in her tonsils per patient, depression, TIA , lives in assisted living facility comes into the ER with chief complaints of weakness somewhat on the right side more than left, but finding difficulty which happened earlier today several hours ago in 3 different intermittent episodes.  She says that for the last day or so she has had some mild left lower quadrant pain with some diarrhea which she has had in the past with mild diverticulitis, since this morning she was feeling weak in both legs, When she went out to have lunch she had some problems using her silverware with the right hand, she also had 2 episodes of word finding difficulties both lasting 10 minutes half an hour apart, came to the ER where her symptoms had completely resolved. Chest x-ray head CT and lab work were unremarkable. I was called to admit the patient for TIA workup.   Clinical Impression  Pt is an 80 year old female who presents to physical therapy with dx of Rt sided weakness.  Pt reports a hx of stroke 08/2013, and has been living in an ALF "to not be alone".  Pt reports she graduated from [place] ~3 weeks ago, and was mod (I) with bed mobility skills, transfers, and amb with use of std cane.  Pt does report she was using her rollator yesterday because she didn't feel right.  During evaluation, pt was mod (I) with bed mobility skills, transfers, and ambulation with rollator or std cane.  Gait assessment completed with both AD, without deviations, only decreased cadence though pt reports that is her normal speed.  No noted deficits in functional mobility skills, pt is at baseline of  functioning.  No further acute PT or PT services recommended; pt to be d/c from acute PT services.  No DME recommendations.     Follow Up Recommendations No PT follow up    Equipment Recommendations  None recommended by PT       Precautions / Restrictions Precautions Precautions: Fall Precaution Comments: Possible TIA Restrictions Weight Bearing Restrictions: No      Mobility  Bed Mobility Overal bed mobility: Modified Independent             General bed mobility comments: Increased time and use of hand rail to complete task.   Transfers Overall transfer level: Modified independent Equipment used: Straight cane (Personal rollator )                Ambulation/Gait Ambulation/Gait assistance: Modified independent (Device/Increase time) Ambulation Distance (Feet): 30 Feet Assistive device: Straight cane (Rollator) Gait Pattern/deviations: WFL(Within Functional Limits);Step-through pattern   Gait velocity interpretation: Below normal speed for age/gender General Gait Details: 30' x2, gait assessed with RW and with std cane.   Pt was able to amb from bed to bathroom without AD.  No LOB or gait significant deviations, except decreased cadence noted.    Modified Rankin (Stroke Patients Only) Modified Rankin (Stroke Patients Only) Pre-Morbid Rankin Score: No symptoms Modified Rankin: No significant disability     Balance Overall balance assessment: No apparent balance deficits (not formally assessed)  Pertinent Vitals/Pain No pain.     Home Living Family/patient expects to be discharged to:: Assisted living     Type of Home: Assisted living         Home Equipment: Grab bars - tub/shower;Grab bars - toilet;Bedside commode;Shower seat - built in;Cane - single point;Walker - 4 wheels      Prior Function Level of Independence: Independent with assistive device(s)         Comments: Pt lives  in assisted living, and community provides services for food prerp, cleaning, and laundry.  pt is able to complete all ADLS with modified independence.     Hand Dominance   Dominant Hand: Right    Extremity/Trunk Assessment   Upper Extremity Assessment: Overall WFL for tasks assessed (No acute coordination or sensation deficits noted. Hx peripheral neuropathy.)           Lower Extremity Assessment: Defer to PT evaluation RLE Deficits / Details: MMT hip 4+/5, knee/ankle 5/5.  No coordination deficiets noted in Rt LE.  LLE Deficits / Details: MMT hip 4+/5, knee/ankle 5/5.  No coordination deficiets noted in Lt LE.      Communication   Communication: No difficulties  Cognition Arousal/Alertness: Awake/alert Behavior During Therapy: WFL for tasks assessed/performed Overall Cognitive Status: Within Functional Limits for tasks assessed                               Assessment/Plan    PT Assessment Patent does not need any further PT services  PT Diagnosis     PT Problem List    PT Treatment Interventions     PT Goals (Current goals can be found in the Care Plan section) Acute Rehab PT Goals Patient Stated Goal: No OT goals needed PT Goal Formulation: No goals set, d/c therapy     End of Session Equipment Utilized During Treatment: Gait belt Activity Tolerance: Patient tolerated treatment well Patient left: in bed;with call bell/phone within reach;with bed alarm set           Time: 0812-0835 PT Time Calculation (min): 23 min   Charges:   PT Evaluation $Initial PT Evaluation Tier I: 1 Procedure          Meghan Duran 02/05/2014, 10:30 AM

## 2014-02-05 NOTE — Care Management Note (Signed)
    Page 1 of 1   02/05/2014     2:16:10 PM CARE MANAGEMENT NOTE 02/05/2014  Patient:  Meghan Duran, Meghan Duran   Account Number:  [id number]  Date Initiated:  02/05/2014  Documentation initiated by:  Theophilus Kinds  Subjective/Objective Assessment:   Pt admitted from home with tia. Pt lives independently at [facility] and will return home at discharge. Pt has a cane and rollator for home use.     Action/Plan:   Pt discharged home today. No CM needs noted.   Anticipated DC Date:  02/05/2014   Anticipated DC Plan:  [place]  CM consult      Choice offered to / List presented to:             Status of service:  Completed, signed off Medicare Important Message given?   (If response is "NO", the following Medicare IM given date fields will be blank) Date Medicare IM given:   Medicare IM given by:   Date Additional Medicare IM given:   Additional Medicare IM given by:    Discharge Disposition:  HOME/SELF CARE  Per UR Regulation:    If discussed at Long Length of Stay Meetings, dates discussed:    Comments:  02/05/14 [place], RN BSN CM

## 2014-02-05 NOTE — Evaluation (Signed)
Speech Language Pathology Evaluation Patient Details Name: Meghan Duran MRN: [id number] DOB: 1924-01-03 Today's Date: 02/05/2014 Time: 0100-0130 SLP Time Calculation (min): 30 min  Problem List:  Patient Active Problem List   Diagnosis Date Noted  . TIA (transient ischemic attack) 09/22/2013  . Right sided weakness 09/22/2013  . Depression 09/22/2013  . Anxiety 09/22/2013  . GERD (gastroesophageal reflux disease) 09/22/2013  . UTI (lower urinary tract infection) 09/22/2013  . ARF (acute renal failure) 09/22/2013  . HTN (hypertension) 09/22/2013  . Thyroid nodule 09/22/2013  . Diabetic neuropathy, type II diabetes mellitus 02/19/2013  . Heme positive stool 02/19/2013  . Pernicious anemia 11/20/2012  . DM II (diabetes mellitus, type II), controlled 11/20/2012  . Renal insufficiency 11/20/2012  . Abdominal pain, unspecified site 11/20/2012  . Hyperlipemia 11/20/2012   Past Medical History:  Past Medical History  Diagnosis Date  . Hypertension   . Cancer   . Pernicious anemia   . Neuropathy     Feet and legs  . Diabetes mellitus without complication   . Depression 09/22/2013  . Anxiety 09/22/2013  . GERD (gastroesophageal reflux disease) 09/22/2013  . HTN (hypertension) 09/22/2013  . Thyroid nodule    Past Surgical History:  Past Surgical History  Procedure Laterality Date  . Abdominal hysterectomy    . Cholecystectomy    . Appendectomy    . Bladder surgery    . Rectal surgery     HPI:  80 y.o. female, history of diabetes mellitus type 2 not on insulin, hypertension, dyslipidemia, CTD stage III baseline creatinine around 1.3 ,GERD, thyroid nodule, malignancy in her tonsils per patient, depression, TIA , lives in assisted living facility comes into the ER on 02/04/14 with chief complaints of weakness somewhat on the right side more than left, word finding difficulty which happened earlier today several hours ago in 3 different intermittent episodes.  SLE requested on 02/05/14    Assessment / Plan / Recommendation Clinical Impression  Pt with intermittent word finding difficulty at conversational level, but functional for pt age with strategies reviewed prn, but not requiring ST at this time; auditory comprehension tasks and cognitive tasks all within functional limits; no f/u recommended for ST at this time    SLP Assessment  Patient does not need any further Speech Lanaguage Pathology Services    Follow Up Recommendations  None    Frequency and Duration   n/a     Pertinent Vitals/Pain Pain Assessment: No/denies pain   SLP Goals  n/a  SLP Evaluation Prior Functioning  Cognitive/Linguistic Baseline: Within functional limits Type of Home: Assisted living Available Help at Discharge: Family Vocation: Retired   Associate Professor  Overall Cognitive Status: Within Functional Limits for tasks assessed Arousal/Alertness: Awake/alert Orientation Level: Oriented X4 Memory: Appears intact Awareness: Appears intact Problem Solving: Appears intact Safety/Judgment: Appears intact    Comprehension  Auditory Comprehension Overall Auditory Comprehension: Appears within functional limits for tasks assessed Visual Recognition/Discrimination Discrimination: Not tested Reading Comprehension Reading Status: Not tested    Expression Expression Primary Mode of Expression: Verbal Verbal Expression Overall Verbal Expression: Impaired Initiation: No impairment Level of Generative/Spontaneous Verbalization: Conversation Repetition: No impairment Naming: Impairment Responsive: 76-100% accurate Confrontation: Within functional limits Convergent: 75-100% accurate Divergent: 75-100% accurate Verbal Errors: Perseveration;Aware of errors Pragmatics: No impairment Non-Verbal Means of Communication: Not applicable Written Expression Dominant Hand: Right Written Expression: Not tested   Oral / Motor Oral Motor/Sensory Function Overall Oral Motor/Sensory Function: Appears  within functional limits for tasks assessed Motor Speech Overall  Motor Speech: Appears within functional limits for tasks assessed Respiration: Within functional limits Phonation: Normal Resonance: Within functional limits Articulation: Within functional limitis Intelligibility: Intelligible Motor Planning: Witnin functional limits Motor Speech Errors: Not applicable        ADAMS,PAT, M.S., CCC-SLP 02/05/2014, 2:59 PM

## 2014-02-05 NOTE — Progress Notes (Signed)
IV removed as per physician order.

## 2014-02-05 NOTE — Progress Notes (Signed)
  Echocardiogram 2D Echocardiogram has been performed.  [place], [place] 02/05/2014, 11:31 AM

## 2014-02-05 NOTE — Progress Notes (Signed)
Discharge instructions reviewed with patient. Understanding verbalized. Ready for discharge back to [facility]

## 2014-02-05 NOTE — Discharge Summary (Addendum)
Physician Discharge Summary  Meghan Duran ZOX:[id number] DOB: [birth year] DOA: 02/04/2014  PCP: [name], MD  Admit date: 02/04/2014 Discharge date: 02/05/2014  Time spent: 35 minutes  Recommendations for Outpatient Follow-up:  1. Follow up with PCP as scheduled 2. Consider Neurology referral as outpatient 3. Follow up 2D echo done on 8/6  Discharge Diagnoses:  Principal Problem:   Right sided weakness Active Problems:   DM II (diabetes mellitus, type II), controlled   Hyperlipemia   Heme positive stool   TIA (transient ischemic attack)   GERD (gastroesophageal reflux disease)   UTI (lower urinary tract infection)   HTN (hypertension)  Right sided weakness, possible from TIA Discharge Condition: Stable  Diet recommendation: diabetic  There were no vitals filed for this visit.  History of present illness:  See h and p from 8/5 for details. Briefly, pt presents with acute word-finding difficulties with mild R sided weakness, admitted for CVA work up.  Hospital Course:  Pt was admitted to the floor. She was continued on empiric flagyl for presumed diverticulitis. CT head as well as MRI brain was neg for acute infarct. Carotid dopplers were neg. 2D echo was done with results pending at the time of this dictation. The patient was seen by PT/OT and felt stable for d/c home with no needs.  Discharge Exam: Filed Vitals:   02/05/14 0000 02/05/14 0222 02/05/14 0346 02/05/14 0613  BP: 152/62 142/60 156/64 164/73  Pulse: 84 81 82 81  Temp: 98.3 F (36.8 C) 98 F (36.7 C) 98.3 F (36.8 C) 98 F (36.7 C)  TempSrc: Oral Oral Oral Oral  Resp: 20 18 18 18   SpO2: 100% 100% 98% 96%    General: Awake, in nad Cardiovascular: regular, s1, s2 Respiratory: normal resp effort, no wheezing  Discharge Instructions     Medication List         aspirin 81 MG chewable tablet  Chew 1 tablet (81 mg total) by mouth daily.     busPIRone 15 MG tablet  Commonly known as:  BUSPAR   Take 15 mg by mouth 2 (two) times daily.     citalopram 40 MG tablet  Commonly known as:  CELEXA  Take 40 mg by mouth daily.     cloNIDine 0.1 MG tablet  Commonly known as:  CATAPRES  Take 1 tablet (0.1 mg total) by mouth 2 (two) times daily.     fluticasone 50 MCG/ACT nasal spray  Commonly known as:  FLONASE  Place 1 spray into the nose daily.     glipiZIDE 5 MG tablet  Commonly known as:  GLUCOTROL  Take 2.5 mg by mouth every morning.     hyoscyamine 0.125 MG SL tablet  Commonly known as:  LEVSIN SL  Place 0.125 mg under the tongue 3 (three) times daily as needed for cramping.     loratadine 10 MG tablet  Commonly known as:  CLARITIN  Take 1 tablet (10 mg total) by mouth daily.     losartan 100 MG tablet  Commonly known as:  COZAAR  Take 100 mg by mouth daily.     meclizine 12.5 MG tablet  Commonly known as:  ANTIVERT  Take 12.5 mg by mouth 2 (two) times daily as needed for dizziness or nausea.     metFORMIN 500 MG tablet  Commonly known as:  GLUCOPHAGE  Take 500-750 mg by mouth 2 (two) times daily. Take one tablet in the morning and one and one-half tablet at supper  metoprolol succinate 25 MG 24 hr tablet  Commonly known as:  TOPROL-XL  Take 25 mg by mouth daily.     metroNIDAZOLE 500 MG tablet  Commonly known as:  FLAGYL  Take 1 tablet (500 mg total) by mouth every 8 (eight) hours.     omeprazole 20 MG capsule  Commonly known as:  PRILOSEC  Take 20 mg by mouth daily as needed (for acid reflux).     pravastatin 40 MG tablet  Commonly known as:  PRAVACHOL  Take 1 tablet (40 mg total) by mouth every evening.     TYLENOL EX ST ARTHRITIS PAIN 500 MG tablet  Generic drug:  acetaminophen  Take 500 mg by mouth every 6 (six) hours as needed for mild pain or moderate pain.       Allergies  Allergen Reactions  . Amoxil [Amoxicillin] Other (See Comments)    Unknown reaction  . Cymbalta [Duloxetine Hcl] Other (See Comments)    Unknown Side effects  .  Macrobid [Nitrofurantoin Macrocrystal]     Diarrhea and swollen lips  . Tramadol Other (See Comments)    dizzy   Follow-up Information   Follow up with [name], MD. (Keep appt on 02/10/14)    Specialty:  Family Medicine   Contact information:   [address] [place] [address] [phone]        The results of significant diagnostics from this hospitalization (including imaging, microbiology, ancillary and laboratory) are listed below for reference.    Significant Diagnostic Studies: Dg Chest 1 View  02/04/2014   CLINICAL DATA:  Weakness.  EXAM: CHEST - 1 VIEW  COMPARISON:  None.  FINDINGS: Mediastinum hilar structures are normal. Mild basilar atelectasis. Lower portions of both lungs not imaged. Heart size normal. No pleural effusion or pneumothorax. Degenerative changes both shoulders.  IMPRESSION: Mild basilar atelectasis. Lower portion of both lungs noted image. No acute cardiopulmonary disease otherwise noted. Standard PA and lateral chest x-ray can be obtained to further evaluate.   Electronically Signed   By: Marcello Moores  Register   On: 02/04/2014 16:26   Dg Chest 2 View  02/04/2014   CLINICAL DATA:  Transient ischemic attack.  EXAM: CHEST  2 VIEW 6:40 p.m.  COMPARISON:  02/04/2014 at 4:18 p.m. and 09/22/2013  FINDINGS: The heart size and mediastinal contours are within normal limits. Both lungs are clear. The visualized skeletal structures are unremarkable.  Calcification in the thoracic aorta.  IMPRESSION: No acute abnormalities.   Electronically Signed   By: Rozetta Nunnery M.D.   On: 02/04/2014 19:10   Ct Head Wo Contrast  02/04/2014   CLINICAL DATA:  Weakness  EXAM: CT HEAD WITHOUT CONTRAST  TECHNIQUE: Contiguous axial images were obtained from the base of the skull through the vertex without intravenous contrast.  COMPARISON:  12/04/2013  FINDINGS: There is no evidence of mass effect, midline shift, or extra-axial fluid collections. There is no evidence of a  space-occupying lesion or intracranial hemorrhage. There is no evidence of a cortical-based area of acute infarction. There is generalized cerebral atrophy. There is periventricular white matter low attenuation likely secondary to microangiopathy.  The ventricles and sulci are appropriate for the patient's age. The basal cisterns are patent.  Visualized portions of the orbits are unremarkable. The visualized portions of the paranasal sinuses and mastoid air cells are unremarkable. Cerebrovascular atherosclerotic calcifications are noted.  The osseous structures are unremarkable.  IMPRESSION: No acute intracranial pathology.   Electronically Signed   By: Elbert Ewings  Patel   On: 02/04/2014 16:16   Mr Jodene Nam Head Wo Contrast  02/04/2014   CLINICAL DATA:  80 year old female with episode of right side weakness and abnormal speech. Initial encounter.  EXAM: MRA HEAD WITHOUT CONTRAST  TECHNIQUE: Angiographic images of the Circle of Willis were obtained using MRA technique without intravenous contrast.  COMPARISON:  Brain MRI from the same day reported separately. Intracranial MRA 09/22/2013.  FINDINGS: Stable antegrade flow in the posterior circulation with codominant distal vertebral arteries. Normal PICA origins. Normal vertebrobasilar junction. No basilar stenosis. SCA and PCA origins are stable and within normal limits. Posterior communicating arteries are diminutive or absent. Bilateral PCA branches are within normal limits.  Stable antegrade flow signal in both ICA siphons. No ICA stenosis identified. Normal ophthalmic artery origins. Stable and normal carotid termini, MCA and ACA origins.  Mild to moderate left ACA A1 segment stenosis is stable. Anterior communicating artery and other visualized bilateral ACA branches are within normal limits.  Visualized bilateral MCA branches are stable and within normal limits.  IMPRESSION: Stable and negative for age intracranial MRA.   Electronically Signed   By: Lars Pinks M.D.   On:  02/04/2014 21:15   Mr Brain Wo Contrast  02/04/2014   CLINICAL DATA:  80 year old female with episode of right side weakness and abnormal speech. Initial encounter. History of prior stroke.  EXAM: MRI HEAD WITHOUT CONTRAST  TECHNIQUE: Multiplanar, multiecho pulse sequences of the brain and surrounding structures were obtained without intravenous contrast.  COMPARISON:  Brain MRI 09/22/2013.  Head CT 1618 hr today.  FINDINGS: Major intracranial vascular flow voids are stable. No restricted diffusion to suggest acute infarction. No midline shift, mass effect, evidence of mass lesion, ventriculomegaly, extra-axial collection or acute intracranial hemorrhage. Cervicomedullary junction and pituitary are within normal limits. Negative visualized cervical spine.  Stable gray-white matter differentiation throughout the brain. No cortical encephalomalacia. Mild for age nonspecific white matter and basal ganglia at T2 and FLAIR hyperintensity. Thalami are spared. Brainstem and cerebellum are within normal limits.  Visible internal auditory structures appear normal. Mastoids are clear. Mild paranasal sinus mucosal thickening not significantly changed. Stable orbits soft tissues. Visualized scalp soft tissues are within normal limits. Normal bone marrow signal.  IMPRESSION: No acute intracranial abnormality. Stable non contrast MRI appearance of the brain.   Electronically Signed   By: Lars Pinks M.D.   On: 02/04/2014 21:10   US Carotid Bilateral  02/05/2014   CLINICAL DATA:  CVA, history of hypertension, TIA, hyperlipidemia, diabetes  EXAM: BILATERAL CAROTID DUPLEX ULTRASOUND  TECHNIQUE: Pearline Cables scale imaging, color Doppler and duplex ultrasound were performed of bilateral carotid and vertebral arteries in the neck.  COMPARISON:  Brain MRI -02/04/2014  FINDINGS: Criteria: Quantification of carotid stenosis is based on velocity parameters that correlate the residual internal carotid diameter with NASCET-based stenosis levels,  using the diameter of the distal internal carotid lumen as the denominator for stenosis measurement.  The following velocity measurements were obtained:  RIGHT  ICA:  102/26 cm/sec  CCA:  08/6 cm/sec  SYSTOLIC ICA/CCA RATIO:  7.61  DIASTOLIC ICA/CCA RATIO:  9.50  ECA:  401 cm/sec  LEFT  ICA:  78/16 cm/sec  CCA:  93/26 cm/sec  SYSTOLIC ICA/CCA RATIO:  7.12  DIASTOLIC ICA/CCA RATIO:  4.58  ECA:  110 cm/sec  RIGHT CAROTID ARTERY: There is a minimal amount of eccentric echogenic plaque within the proximal aspect of the right common carotid artery (Image 3). There is a moderate amount of eccentric  mixed echogenic partially shadowing plaque within the right carotid bulb (images 16 and 18), extending to involve the origin and proximal aspects of the right internal carotid artery (image 31), not resulting in elevated peak systolic velocities with the interrogated course of the right internal carotid artery to suggest a hemodynamically significant stenosis.  RIGHT VERTEBRAL ARTERY:  Antegrade flow  LEFT CAROTID ARTERY: There is a moderate amount of eccentric echogenic partially shadowing plaque within the left carotid bulb (image 53 and 56), extending to involve the origin and proximal aspects of the left internal carotid artery (image 70), not resulting in elevated peak systolic velocities within the interrogated course of the left internal carotid artery to suggest a hemodynamically significant stenosis.  LEFT VERTEBRAL ARTERY:  Antegrade flow  IMPRESSION: Moderate amount of bilateral atherosclerotic plaque, not resulting in a hemodynamically significant stenosis.   Electronically Signed   By: Sandi Mariscal M.D.   On: 02/05/2014 11:14    Microbiology: No results found for this or any previous visit (from the past 240 hour(s)).   Labs: Basic Metabolic Panel:  Recent Labs Lab 02/04/14 1520 02/04/14 1526 02/05/14 0600  NA 141 138 142  K 4.3 4.1 4.4  CL 103 108 105  CO2 25  --  27  GLUCOSE 146* 144* 140*  BUN  23 23 20   CREATININE 1.16* 1.20* 1.12*  CALCIUM 9.2  --  8.7   Liver Function Tests:  Recent Labs Lab 02/04/14 1520  AST 21  ALT 19  ALKPHOS 55  BILITOT 0.3  PROT 6.6  ALBUMIN 3.7   No results found for this basename: LIPASE, AMYLASE,  in the last 168 hours No results found for this basename: AMMONIA,  in the last 168 hours CBC:  Recent Labs Lab 02/04/14 1520 02/04/14 1526 02/05/14 0600  WBC 8.8  --  5.8  NEUTROABS 5.5  --   --   HGB 12.7 12.6 11.9*  HCT 37.4 37.0 35.5*  MCV 91.9  --  92.0  PLT 178  --  166   Cardiac Enzymes: No results found for this basename: CKTOTAL, CKMB, CKMBINDEX, TROPONINI,  in the last 168 hours BNP: BNP (last 3 results) No results found for this basename: PROBNP,  in the last 8760 hours CBG:  Recent Labs Lab 02/04/14 2208 02/05/14 0729 02/05/14 1211  GLUCAP 165* 150* 108*   Signed:  CHIU, STEPHEN K  [facility] 02/05/2014, 1:42 PM

## 2014-02-05 NOTE — Progress Notes (Signed)
UR chart review completed.  

## 2014-02-06 ENCOUNTER — Other Ambulatory Visit: Payer: Self-pay | Admitting: Family Medicine

## 2014-02-06 NOTE — Telephone Encounter (Signed)
Noted, will have followup next week

## 2014-02-10 ENCOUNTER — Ambulatory Visit (INDEPENDENT_AMBULATORY_CARE_PROVIDER_SITE_OTHER): Payer: Medicare Other | Admitting: Family Medicine

## 2014-02-10 ENCOUNTER — Encounter: Payer: Self-pay | Admitting: Family Medicine

## 2014-02-10 VITALS — BP 132/86 | Ht 60.5 in | Wt 192.0 lb

## 2014-02-10 DIAGNOSIS — G451 Carotid artery syndrome (hemispheric): Secondary | ICD-10-CM

## 2014-02-10 DIAGNOSIS — F329 Major depressive disorder, single episode, unspecified: Secondary | ICD-10-CM | POA: Diagnosis not present

## 2014-02-10 DIAGNOSIS — D510 Vitamin B12 deficiency anemia due to intrinsic factor deficiency: Secondary | ICD-10-CM

## 2014-02-10 DIAGNOSIS — F3289 Other specified depressive episodes: Secondary | ICD-10-CM | POA: Diagnosis not present

## 2014-02-10 DIAGNOSIS — G458 Other transient cerebral ischemic attacks and related syndromes: Secondary | ICD-10-CM

## 2014-02-10 DIAGNOSIS — E785 Hyperlipidemia, unspecified: Secondary | ICD-10-CM

## 2014-02-10 DIAGNOSIS — F32A Depression, unspecified: Secondary | ICD-10-CM

## 2014-02-10 MED ORDER — AMLODIPINE BESYLATE 2.5 MG PO TABS: 2.5000 mg | ORAL_TABLET | Freq: Every day | ORAL | Status: DC

## 2014-02-10 MED ORDER — BUPROPION HCL ER (SR) 150 MG PO TB12: 150.0000 mg | ORAL_TABLET | Freq: Two times a day (BID) | ORAL | Status: DC

## 2014-02-10 MED ORDER — CYANOCOBALAMIN 1000 MCG/ML IJ SOLN
1000.0000 ug | Freq: Once | INTRAMUSCULAR | Status: AC
  Administered 2014-02-10: 1000 ug via INTRAMUSCULAR

## 2014-02-10 NOTE — Progress Notes (Signed)
   Subjective:    Patient ID: Meghan Duran, female    DOB: 05/31/1924, 80 y.o.   MRN: [id number]  HPI  Patient arrives for a follow up from a recent hospitalization for threatened stroke. Patient also having sinus pressure and ear ache. A complete review of her hospitalization was done with the family present her daughter was present today I went over the MRI MRA carotid ultrasound. Whenever the hospital notes and lab work.  Patient states that her symptoms had her lasted less than 24 hours and totally were everted.    Review of Systems Denies difficulty swallowing denies chest tightness pressure pain shortness breath denies nausea vomiting diarrhea. She does relate having some sinus symptoms only for the past couple days.    Objective:   Physical Exam This patient makes good interactions. No obvious neurologic deficits Neck no masses Lungs are clear no crackles respiratory rate is normal Heart is regular no murmurs Abdomen soft patient sitting in a chair Extremities no edema skin warm dry Pulses normal. Neurologically patient has good strength bilateral is able to walk well. Able to talk well       Assessment & Plan:  I doubt sinus infection because no drainage but if symptoms persist will call in antibiotics  Family is concerned about potential for a reoccurrence of stroke I agree with this continue current measures neurology consultation no further testing currently  Patient has pernicious anemia she feels that the shot is not helping her we will check a B12 level.  Hyperlipidemia continue current medications check lipid profile  HTN good control continue current measures patient will recheck in one month's time for the 12th  In the depressive was changed to Wellbutrin. Followup 4 weeks. 25 minutes spent with family

## 2014-02-24 DIAGNOSIS — M19049 Primary osteoarthritis, unspecified hand: Secondary | ICD-10-CM | POA: Diagnosis not present

## 2014-03-04 DIAGNOSIS — E785 Hyperlipidemia, unspecified: Secondary | ICD-10-CM | POA: Diagnosis not present

## 2014-03-04 DIAGNOSIS — D510 Vitamin B12 deficiency anemia due to intrinsic factor deficiency: Secondary | ICD-10-CM | POA: Diagnosis not present

## 2014-03-04 LAB — LIPID PANEL
Cholesterol: 147 mg/dL (ref 0–200)
HDL: 61 mg/dL (ref >39)
LDL CALC: 59 mg/dL (ref 0–99)
TRIGLYCERIDES: 136 mg/dL (ref <150)
Total CHOL/HDL Ratio: 2.4 Ratio
VLDL: 27 mg/dL (ref 0–40)

## 2014-03-04 LAB — VITAMIN B12: Vitamin B-12: 640 pg/mL (ref 211–911)

## 2014-03-06 ENCOUNTER — Telehealth: Payer: Self-pay | Admitting: Family Medicine

## 2014-03-06 NOTE — Telephone Encounter (Signed)
For sure  

## 2014-03-06 NOTE — Telephone Encounter (Signed)
Started last night, diarrhea everytime she eats, abdominal pain, low grade fever, feels weak   Would like something called in to [facility] in [place]  Please call when done Pt is scheduled here 03/11/14

## 2014-03-06 NOTE — Telephone Encounter (Signed)
Consult with Dr. Andi Hence advised patient to go to ER for evaluation. Discussed with patient and patient verbalized understanding.

## 2014-03-11 ENCOUNTER — Encounter: Payer: Self-pay | Admitting: Family Medicine

## 2014-03-11 ENCOUNTER — Ambulatory Visit (INDEPENDENT_AMBULATORY_CARE_PROVIDER_SITE_OTHER): Payer: Medicare Other | Admitting: Family Medicine

## 2014-03-11 VITALS — BP 134/82 | Ht 60.5 in | Wt 189.0 lb

## 2014-03-11 DIAGNOSIS — R109 Unspecified abdominal pain: Secondary | ICD-10-CM | POA: Diagnosis not present

## 2014-03-11 DIAGNOSIS — D509 Iron deficiency anemia, unspecified: Secondary | ICD-10-CM

## 2014-03-11 DIAGNOSIS — R195 Other fecal abnormalities: Secondary | ICD-10-CM | POA: Diagnosis not present

## 2014-03-11 DIAGNOSIS — Z23 Encounter for immunization: Secondary | ICD-10-CM

## 2014-03-11 DIAGNOSIS — D510 Vitamin B12 deficiency anemia due to intrinsic factor deficiency: Secondary | ICD-10-CM | POA: Diagnosis not present

## 2014-03-11 DIAGNOSIS — G4700 Insomnia, unspecified: Secondary | ICD-10-CM | POA: Diagnosis not present

## 2014-03-11 DIAGNOSIS — R197 Diarrhea, unspecified: Secondary | ICD-10-CM

## 2014-03-11 MED ORDER — TRAZODONE HCL 50 MG PO TABS: ORAL_TABLET | ORAL | Status: DC

## 2014-03-11 MED ORDER — CYANOCOBALAMIN 1000 MCG/ML IJ SOLN
1000.0000 ug | Freq: Once | INTRAMUSCULAR | Status: AC
  Administered 2014-03-11: 1000 ug via INTRAMUSCULAR

## 2014-03-11 MED ORDER — BUSPIRONE HCL 15 MG PO TABS: 15.0000 mg | ORAL_TABLET | Freq: Two times a day (BID) | ORAL | Status: DC

## 2014-03-11 NOTE — Progress Notes (Signed)
   Subjective:    Patient ID: Meghan Duran, female    DOB: 11-22-1923, 80 y.o.   MRN: [id number]  HPI Patient is here today due to diarrhea & pain in the stomach area. These are the patients concerns. Patients states she has already had diarrhea a couple of times today. One incident right before this afternoon doctor visit. Patient interested in lab work results & getting B-12 injection today.  Review of Systems  Constitutional: Negative for activity change, appetite change and fatigue.  HENT: Negative for congestion and ear discharge.   Respiratory: Negative for cough.   Cardiovascular: Negative for chest pain.  Endocrine: Negative for polydipsia and polyphagia.  Genitourinary: Negative for frequency.  Neurological: Negative for weakness.  Psychiatric/Behavioral: Negative for confusion.       Objective:   Physical Exam  Vitals reviewed. Constitutional: She appears well-nourished. No distress.  Cardiovascular: Normal rate, regular rhythm and normal heart sounds.   No murmur heard. Pulmonary/Chest: Effort normal and breath sounds normal. No respiratory distress.  Musculoskeletal: She exhibits no edema.  Lymphadenopathy:    She has no cervical adenopathy.  Neurological: She is alert. She exhibits normal muscle tone.  Psychiatric: Her behavior is normal.   25 minutes was spent with patient discussing multiple problems below       Assessment & Plan:  b 12 today-she has pernicious anemia. She needs her B12 shot.  trazadone for sleep-suffering with insomnia and this would be a safe choice hopefully will help her we will recheck her 4 weeks  Heme pos stool/ anemia-she had a heme positive stool while in the hospital I think it's reasonable to do a followup on this we will do CBC ferritin and Hemoccult cards. Patient does not want aggressive measures but may need further testing, patient also having intermittent loose stools we will do some stool tests

## 2014-03-24 DIAGNOSIS — R197 Diarrhea, unspecified: Secondary | ICD-10-CM | POA: Diagnosis not present

## 2014-03-24 DIAGNOSIS — R109 Unspecified abdominal pain: Secondary | ICD-10-CM | POA: Diagnosis not present

## 2014-03-25 LAB — POC HEMOCCULT BLD/STL (HOME/3-CARD/SCREEN)
Card #2 Fecal Occult Blod, POC: NEGATIVE
Card #3 Fecal Occult Blood, POC: NEGATIVE
Fecal Occult Blood, POC: NEGATIVE

## 2014-03-25 LAB — CLOSTRIDIUM DIFFICILE BY PCR: CDIFFPCR: NOT DETECTED

## 2014-03-28 LAB — STOOL CULTURE

## 2014-04-06 ENCOUNTER — Telehealth: Payer: Self-pay | Admitting: Family Medicine

## 2014-04-06 ENCOUNTER — Encounter: Payer: Self-pay | Admitting: Neurology

## 2014-04-06 ENCOUNTER — Ambulatory Visit (INDEPENDENT_AMBULATORY_CARE_PROVIDER_SITE_OTHER): Payer: Medicare Other | Admitting: Neurology

## 2014-04-06 VITALS — BP 128/60 | HR 82 | Resp 16 | Ht 65.0 in | Wt 192.1 lb

## 2014-04-06 DIAGNOSIS — G458 Other transient cerebral ischemic attacks and related syndromes: Secondary | ICD-10-CM

## 2014-04-06 DIAGNOSIS — G609 Hereditary and idiopathic neuropathy, unspecified: Secondary | ICD-10-CM

## 2014-04-06 DIAGNOSIS — W19XXXA Unspecified fall, initial encounter: Secondary | ICD-10-CM

## 2014-04-06 NOTE — Progress Notes (Signed)
NEUROLOGY CONSULTATION NOTE  Meghan Duran MRN: [id number] DOB: [birth year]  Referring provider: Dr. Wolfgang Phoenix Primary care provider: Dr. Wolfgang Phoenix  Reason for consult:  TIA  HISTORY OF PRESENT ILLNESS: Meghan Duran is an 80 year old right-handed woman with history of type 2 diabetes mellitus, hypertension, dyslipidemia, CKD stage 3 (baseline Cr 1.3), IA, and depression who presents for TIA.  She is accompanied by her daughter.  Records and following images reviewed.  She was admitted to [facility] on 02/04/14 after experiencing difficulty using the silverware with her right hand and two episodes of word-finding difficulties lasting 10 minutes.  For the last day or so, she had been experiencing abdominal upset, diarrhea and bilateral lower extremity weakness.  CT of the head and MRI of the brain both revealed no acute process.  MRA of the head did not reveal any significant intracranial stenosis.  Carotid doppler revealed moderate bilateral plaque but no hemodynamically significant ICA stenosis.  2D echo showed LVEF 65-70% with no cardiac embolic source.  Hgb A1c was 6.4 with mean plasma glucose of 137.  LDL from 03/04/14 was 59.      Over the past 3 or 4 months, she has had about 3 falls.  She says that sometimes her left leg won't move.  This has been going on for 2 weeks.  She was in bed for most part of a week due to GI upset recently.  She also reports numbness and pins and needles pain in her hands and feet.  She had a fall yesterday, and bruised the left side of her body, particularly her ribs.  She denies pain with breathing.  She currently is taking ASA 81mg  daily.  PAST MEDICAL HISTORY: Past Medical History  Diagnosis Date  . Hypertension   . Cancer   . Pernicious anemia   . Neuropathy     Feet and legs  . Diabetes mellitus without complication   . Depression 09/22/2013  . Anxiety 09/22/2013  . GERD (gastroesophageal reflux disease) 09/22/2013  . HTN (hypertension) 09/22/2013  .  Thyroid nodule   . Tonsil cancer     PAST SURGICAL HISTORY: Past Surgical History  Procedure Laterality Date  . Abdominal hysterectomy    . Cholecystectomy    . Appendectomy    . Bladder surgery    . Rectal surgery      MEDICATIONS: Current Outpatient Prescriptions on File Prior to Visit  Medication Sig Dispense Refill  . acetaminophen (TYLENOL EX ST ARTHRITIS PAIN) 500 MG tablet Take 500 mg by mouth every 6 (six) hours as needed for mild pain or moderate pain.      Marland Kitchen amLODipine (NORVASC) 2.5 MG tablet Take 1 tablet (2.5 mg total) by mouth daily.  30 tablet  5  . aspirin 81 MG chewable tablet Chew 1 tablet (81 mg total) by mouth daily.      Marland Kitchen buPROPion (WELLBUTRIN SR) 150 MG 12 hr tablet Take 1 tablet (150 mg total) by mouth 2 (two) times daily.  60 tablet  5  . busPIRone (BUSPAR) 15 MG tablet Take 1 tablet (15 mg total) by mouth 2 (two) times daily.  60 tablet  6  . cloNIDine (CATAPRES) 0.1 MG tablet Take 1 tablet (0.1 mg total) by mouth 2 (two) times daily.  60 tablet  5  . glipiZIDE (GLUCOTROL) 5 MG tablet Take 2.5 mg by mouth every morning.      . hyoscyamine (LEVSIN SL) 0.125 MG SL tablet Place 0.125 mg under the  tongue 3 (three) times daily as needed for cramping.      . loratadine (CLARITIN) 10 MG tablet Take 1 tablet (10 mg total) by mouth daily.  30 tablet  11  . losartan (COZAAR) 100 MG tablet TAKE ONE TABLET BY MOUTH ONCE DAILY  30 tablet  5  . meclizine (ANTIVERT) 12.5 MG tablet Take 12.5 mg by mouth 2 (two) times daily as needed for dizziness or nausea.       . metFORMIN (GLUCOPHAGE) 500 MG tablet Take 500-750 mg by mouth 2 (two) times daily. Take one tablet in the morning and one and one-half tablet at supper      . metoprolol succinate (TOPROL-XL) 25 MG 24 hr tablet Take 25 mg by mouth daily.      Marland Kitchen omeprazole (PRILOSEC) 20 MG capsule Take 20 mg by mouth daily as needed (for acid reflux).      . pravastatin (PRAVACHOL) 40 MG tablet Take 1 tablet (40 mg total) by mouth  every evening.  30 tablet  11  . traZODone (DESYREL) 50 MG tablet 50 mg qhs for sleep  30 tablet  6  . fluticasone (FLONASE) 50 MCG/ACT nasal spray Place 1 spray into the nose daily.  16 g  5   No current facility-administered medications on file prior to visit.    ALLERGIES: Allergies  Allergen Reactions  . Amoxil [Amoxicillin] Other (See Comments)    Unknown reaction  . Cymbalta [Duloxetine Hcl] Other (See Comments)    Unknown Side effects  . Macrobid [Nitrofurantoin Macrocrystal]     Diarrhea and swollen lips  . Tramadol Other (See Comments)    dizzy    FAMILY HISTORY: Family History  Problem Relation Age of Onset  . Diabetes Sister   . Heart attack Sister   . Cancer Sister     head neck     SOCIAL HISTORY: History   Social History  . Marital Status: Married    Spouse Name: N/A    Number of Children: N/A  . Years of Education: N/A   Occupational History  . Not on file.   Social History Main Topics  . Smoking status: Former Smoker    Quit date: 11/20/1985  . Smokeless tobacco: Not on file     Comment: Over 30 years ago  . Alcohol Use: Yes     Comment: occasional  . Drug Use: No  . Sexual Activity: No   Other Topics Concern  . Not on file   Social History Narrative  . No narrative on file    REVIEW OF SYSTEMS: Constitutional: No fevers, chills, or sweats, no generalized fatigue, change in appetite Eyes: No visual changes, double vision, eye pain Ear, nose and throat: No hearing loss, ear pain, nasal congestion, sore throat Cardiovascular: No chest pain, palpitations Respiratory:  No shortness of breath at rest or with exertion, wheezes GastrointestinaI: No nausea, vomiting, diarrhea, abdominal pain, fecal incontinence Genitourinary:  No dysuria, urinary retention or frequency Musculoskeletal:  Left sided rib pain Integumentary: Rash and swelling of legs. Neurological: as above Psychiatric: No depression, insomnia, anxiety Endocrine: No  palpitations, fatigue, diaphoresis, mood swings, change in appetite, change in weight, increased thirst Hematologic/Lymphatic:  No anemia, purpura, petechiae. Allergic/Immunologic: no itchy/runny eyes, nasal congestion, recent allergic reactions, rashes  PHYSICAL EXAM: Filed Vitals:   04/06/14 1434  BP: 128/60  Pulse: 82  Resp: 16   General: No acute distress Head:  Normocephalic/atraumatic Neck: supple, no paraspinal tenderness, full range of motion Back: No  paraspinal tenderness Heart: regular rate and rhythm Lungs: Clear to auscultation bilaterally. Vascular: No carotid bruits. Neurological Exam: Mental status: alert and oriented to person, place, and time, recent and remote memory intact, fund of knowledge intact, attention and concentration intact, speech fluent and not dysarthric, language intact. Cranial nerves: CN I: not tested CN II: pupils equal, round and reactive to light, visual fields intact, fundi unremarkable, without vessel changes, exudates, hemorrhages or papilledema. CN III, IV, VI:  full range of motion, no nystagmus, no ptosis CN V: facial sensation intact CN VII: upper and lower face symmetric CN VIII: hearing intact CN IX, X: gag intact, uvula midline CN XI: sternocleidomastoid and trapezius muscles intact CN XII: tongue midline Bulk & Tone: normal, no fasciculations. Motor: 5/5 throughout Sensation: reduced pinprick and vibration in the feet Deep Tendon Reflexes: absent.  Toes downgoing. Finger to nose testing: no dysmetria Gait: wide-based gait.  Unable to tandem. Romberg with sway.  IMPRESSION: TIA Idiopathic peripheral neuropathy Frequent falls, likely related to neuropathy Left leg incoordination.  Not really appreciated on exam.  Possibly a stroke?  Would not reimage the brain since it won't change management.  PLAN: 1.  Continue ASA 81mg  daily.  Due to being a fall risk, I would not switch to Plavix, as studies find that ASA is not inferior  to Plavix in secondary stroke prevention, but Plavix increases risk of bleeding. 2.  Continue diabetes control and statin therapy (LDL at goal of less than 100) 3.  Refer to PT again, as she likely has deconditioning from nonambulatory for a week. 4.  Rib films to assess for fracture 5.  Follow up in 6 months.  Thank you for allowing me to take part in the care of this patient.  Metta Clines, DO  CC:  [name], MD

## 2014-04-06 NOTE — Patient Instructions (Signed)
1.  Continue aspirin 81mg  daily 2.  Physical therapy for walking 3.  Xrays of ribs 4.  Follow up in April

## 2014-04-06 NOTE — Telephone Encounter (Signed)
Patient had a fall yesterday in dining room at [employer], complaining of side pain. Offered appointment for today, but she said she did not need an appointment with [place], but just to document. She is seeing another doctor today.

## 2014-04-07 ENCOUNTER — Ambulatory Visit (HOSPITAL_COMMUNITY)
Admission: RE | Admit: 2014-04-07 | Discharge: 2014-04-07 | Disposition: A | Discharge status: Home / Self Care | Payer: Medicare Other | Source: Ambulatory Visit | Attending: Neurology | Admitting: Neurology

## 2014-04-07 ENCOUNTER — Other Ambulatory Visit: Payer: Self-pay | Admitting: *Deleted

## 2014-04-07 DIAGNOSIS — S2242XA Multiple fractures of ribs, left side, initial encounter for closed fracture: Secondary | ICD-10-CM | POA: Diagnosis not present

## 2014-04-07 DIAGNOSIS — W19XXXA Unspecified fall, initial encounter: Secondary | ICD-10-CM | POA: Insufficient documentation

## 2014-04-07 DIAGNOSIS — G609 Hereditary and idiopathic neuropathy, unspecified: Secondary | ICD-10-CM

## 2014-04-07 DIAGNOSIS — R0789 Other chest pain: Secondary | ICD-10-CM | POA: Insufficient documentation

## 2014-04-07 DIAGNOSIS — G459 Transient cerebral ischemic attack, unspecified: Secondary | ICD-10-CM

## 2014-04-07 DIAGNOSIS — S2241XA Multiple fractures of ribs, right side, initial encounter for closed fracture: Secondary | ICD-10-CM | POA: Insufficient documentation

## 2014-04-07 DIAGNOSIS — G458 Other transient cerebral ischemic attacks and related syndromes: Secondary | ICD-10-CM

## 2014-04-07 DIAGNOSIS — R296 Repeated falls: Secondary | ICD-10-CM

## 2014-04-08 ENCOUNTER — Telehealth: Payer: Self-pay | Admitting: Neurology

## 2014-04-08 ENCOUNTER — Telehealth: Payer: Self-pay | Admitting: *Deleted

## 2014-04-08 ENCOUNTER — Telehealth: Payer: Self-pay | Admitting: Family Medicine

## 2014-04-08 DIAGNOSIS — Z8673 Personal history of transient ischemic attack (TIA), and cerebral infarction without residual deficits: Secondary | ICD-10-CM | POA: Diagnosis not present

## 2014-04-08 DIAGNOSIS — Z9181 History of falling: Secondary | ICD-10-CM | POA: Diagnosis not present

## 2014-04-08 DIAGNOSIS — N183 Chronic kidney disease, stage 3 (moderate): Secondary | ICD-10-CM | POA: Diagnosis not present

## 2014-04-08 DIAGNOSIS — I129 Hypertensive chronic kidney disease with stage 1 through stage 4 chronic kidney disease, or unspecified chronic kidney disease: Secondary | ICD-10-CM | POA: Diagnosis not present

## 2014-04-08 DIAGNOSIS — E119 Type 2 diabetes mellitus without complications: Secondary | ICD-10-CM | POA: Diagnosis not present

## 2014-04-08 DIAGNOSIS — R0782 Intercostal pain: Secondary | ICD-10-CM | POA: Diagnosis not present

## 2014-04-08 DIAGNOSIS — R296 Repeated falls: Secondary | ICD-10-CM | POA: Diagnosis not present

## 2014-04-08 DIAGNOSIS — G629 Polyneuropathy, unspecified: Secondary | ICD-10-CM | POA: Diagnosis not present

## 2014-04-08 NOTE — Telephone Encounter (Addendum)
Will this cause drowsiness -patient has history of falls and family not wanting any kind of narcotic pain meds. Was wondering which is better alleve, Advil or tylenol

## 2014-04-08 NOTE — Telephone Encounter (Signed)
Tramadol 50 mg one q 6 prn pain numb 30

## 2014-04-08 NOTE — Telephone Encounter (Signed)
Pt is having a great deal of pain with the rib fx, PT from [facility] is wanting to  Know what you recommend she take for pain since her family is not really wanting  Her to be on any narcotics. Should she take Ibuprofen, acetaminophen, Aleve?  Please advise  As well as she is complaining of urgency/frequency in urination, no fowl smell to it

## 2014-04-08 NOTE — Telephone Encounter (Signed)
Tramadol not a narcotic but can cause drowsiness, flip side is that rib fxs hurt, advil will do better than tylenol

## 2014-04-08 NOTE — Telephone Encounter (Signed)
Debbie from [facility] needs to talk to someone she would like to see the patient twice a week for pt pain medication what can she take please call [phone]

## 2014-04-08 NOTE — Telephone Encounter (Signed)
Patient is aware of rib fracture and advised to contact Dr Wolfgang Phoenix PCP  for pain medication if needed

## 2014-04-08 NOTE — Telephone Encounter (Signed)
Meghan Duran with [facility]  is aware that Dr Tomi Likens will sign the 89  For PT and Dr Wolfgang Phoenix with follow for pain medication

## 2014-04-08 NOTE — Telephone Encounter (Signed)
Family wants to stick with Ibuprofen at this time and will call back if they want to try something else. Advised patient would need office visit for urinary sx and they stated they would all back and schedule office visit.

## 2014-04-10 ENCOUNTER — Telehealth: Payer: Self-pay | Admitting: *Deleted

## 2014-04-10 ENCOUNTER — Ambulatory Visit: Payer: Medicare Other | Admitting: Family Medicine

## 2014-04-10 DIAGNOSIS — G629 Polyneuropathy, unspecified: Secondary | ICD-10-CM | POA: Diagnosis not present

## 2014-04-10 DIAGNOSIS — N183 Chronic kidney disease, stage 3 (moderate): Secondary | ICD-10-CM | POA: Diagnosis not present

## 2014-04-10 DIAGNOSIS — I129 Hypertensive chronic kidney disease with stage 1 through stage 4 chronic kidney disease, or unspecified chronic kidney disease: Secondary | ICD-10-CM | POA: Diagnosis not present

## 2014-04-10 DIAGNOSIS — R0782 Intercostal pain: Secondary | ICD-10-CM | POA: Diagnosis not present

## 2014-04-10 DIAGNOSIS — R296 Repeated falls: Secondary | ICD-10-CM | POA: Diagnosis not present

## 2014-04-10 DIAGNOSIS — E119 Type 2 diabetes mellitus without complications: Secondary | ICD-10-CM | POA: Diagnosis not present

## 2014-04-10 NOTE — Telephone Encounter (Signed)
Valla Leaver from [facility] in [place] called for pt, per sandra last night pt was running a temp of 98.8 and the same this morning, pt was going to take a shower and she felt really dizzy, pt is supposed to have physical therapy today Katharine Look wanted to make Dr. Wolfgang Phoenix aware. Please advise Katharine Look [phone], per Katharine Look patient's Bp was 139/75, HR 80, temp 97.2.

## 2014-04-10 NOTE — Telephone Encounter (Signed)
The temperature seems fine. Blood pressure seems okay as well. Hold off on physical therapy currently. If the patient started having one-sided weakness or numbness or started having significant mental status changes she would need to be seen in the emergency department. Hopefully this weekend if she just takes it easy she will be fine. Her current symptoms do not point toward anything severe. Monitor closely. Followup of ongoing troubles.

## 2014-04-10 NOTE — Telephone Encounter (Signed)
[place] stated the patient was feeling much better at lunch. Advised; The temperature seems fine. Blood pressure seems okay as well. Hold off on physical therapy currently. If the patient started having one-sided weakness or numbness or started having significant mental status changes she would need to be seen in the emergency department. Hopefully this weekend if she just takes it easy she will be fine. Her current symptoms do not point toward anything severe. Monitor closely. Followup of ongoing troubles. The assistant verbalized understanding.

## 2014-04-11 DIAGNOSIS — D510 Vitamin B12 deficiency anemia due to intrinsic factor deficiency: Secondary | ICD-10-CM | POA: Diagnosis not present

## 2014-04-11 DIAGNOSIS — D509 Iron deficiency anemia, unspecified: Secondary | ICD-10-CM | POA: Diagnosis not present

## 2014-04-11 LAB — CBC WITH DIFFERENTIAL/PLATELET
Basophils Absolute: 0.1 10*3/uL (ref 0.0–0.1)
Basophils Relative: 1 % (ref 0–1)
EOS ABS: 0.5 10*3/uL (ref 0.0–0.7)
Eosinophils Relative: 8 % — ABNORMAL HIGH (ref 0–5)
HEMATOCRIT: 33 % — AB (ref 36.0–46.0)
Hemoglobin: 11 g/dL — ABNORMAL LOW (ref 12.0–15.0)
LYMPHS ABS: 1.2 10*3/uL (ref 0.7–4.0)
Lymphocytes Relative: 20 % (ref 12–46)
MCH: 29.9 pg (ref 26.0–34.0)
MCHC: 33.3 g/dL (ref 30.0–36.0)
MCV: 89.7 fL (ref 78.0–100.0)
MONOS PCT: 11 % (ref 3–12)
Monocytes Absolute: 0.7 10*3/uL (ref 0.1–1.0)
NEUTROS PCT: 60 % (ref 43–77)
Neutro Abs: 3.7 10*3/uL (ref 1.7–7.7)
Platelets: 194 10*3/uL (ref 150–400)
RBC: 3.68 MIL/uL — AB (ref 3.87–5.11)
RDW: 12.7 % (ref 11.5–15.5)
WBC: 6.2 10*3/uL (ref 4.0–10.5)

## 2014-04-11 LAB — IRON AND TIBC
%SAT: 28 % (ref 20–55)
IRON: 78 ug/dL (ref 42–145)
TIBC: 278 ug/dL (ref 250–470)
UIBC: 200 ug/dL (ref 125–400)

## 2014-04-12 LAB — FERRITIN: Ferritin: 57 ng/mL (ref 10–291)

## 2014-04-13 ENCOUNTER — Ambulatory Visit (INDEPENDENT_AMBULATORY_CARE_PROVIDER_SITE_OTHER): Payer: Medicare Other | Admitting: Family Medicine

## 2014-04-13 ENCOUNTER — Encounter: Payer: Self-pay | Admitting: Family Medicine

## 2014-04-13 VITALS — BP 126/74 | Ht 65.0 in | Wt 190.0 lb

## 2014-04-13 DIAGNOSIS — R350 Frequency of micturition: Secondary | ICD-10-CM | POA: Diagnosis not present

## 2014-04-13 DIAGNOSIS — I10 Essential (primary) hypertension: Secondary | ICD-10-CM

## 2014-04-13 DIAGNOSIS — D510 Vitamin B12 deficiency anemia due to intrinsic factor deficiency: Secondary | ICD-10-CM

## 2014-04-13 DIAGNOSIS — N3000 Acute cystitis without hematuria: Secondary | ICD-10-CM | POA: Diagnosis not present

## 2014-04-13 DIAGNOSIS — G4700 Insomnia, unspecified: Secondary | ICD-10-CM | POA: Diagnosis not present

## 2014-04-13 LAB — POCT URINALYSIS DIPSTICK
Blood, UA: NEGATIVE
NITRITE UA: NEGATIVE
PH UA: 5
Protein, UA: 30
Spec Grav, UA: 1.025
Urobilinogen, UA: NEGATIVE

## 2014-04-13 MED ORDER — BUPROPION HCL ER (SR) 150 MG PO TB12: 150.0000 mg | ORAL_TABLET | Freq: Every day | ORAL | Status: DC

## 2014-04-13 MED ORDER — CYANOCOBALAMIN 1000 MCG/ML IJ SOLN
1000.0000 ug | Freq: Once | INTRAMUSCULAR | Status: AC
  Administered 2014-04-13: 1000 ug via INTRAMUSCULAR

## 2014-04-13 MED ORDER — CEFPROZIL 500 MG PO TABS: 500.0000 mg | ORAL_TABLET | Freq: Two times a day (BID) | ORAL | Status: DC

## 2014-04-13 NOTE — Progress Notes (Signed)
   Subjective:    Patient ID: Meghan Duran, female    DOB: 07/05/1923, 80 y.o.   MRN: [id number]  Daughter: Meghan Duran Urinary Tract Infection  This is a recurrent problem. The current episode started 1 to 4 weeks ago. The problem has been gradually worsening. There has been no fever. She has tried nothing (Patient sates that she has the urgency to just go all the time. And when she goes she can't control it even with bladder control pads on.) for the symptoms.  Patient states she fell & now has cracked ribs on her left side under her breast.  Temp:97.9 She states that she thinks her legs gave way when she was getting out of a chair and moving to a different chair that is when she fell and cracked her ribs  She denies any symptoms to point toward a stroke denies any unilateral numbness weakness. States her overall energy level is fair.  She states she has poor sleep and she wonders if there is any safe medicines that could be used to help her with her sleep.  Review of Systems  Constitutional: Negative for activity change, appetite change and fatigue.  Gastrointestinal: Negative for abdominal pain.  Neurological: Negative for headaches.  Psychiatric/Behavioral: Negative for behavioral problems.       Objective:   Physical Exam  Vitals reviewed. Constitutional: She appears well-nourished. No distress.  Cardiovascular: Normal rate, regular rhythm and normal heart sounds.   No murmur heard. Pulmonary/Chest: Effort normal and breath sounds normal. No respiratory distress.  Musculoskeletal: She exhibits no edema.  Lymphadenopathy:    She has no cervical adenopathy.  Neurological: She is alert. She exhibits normal muscle tone.  Psychiatric: Her behavior is normal.          Assessment & Plan:  #1 rib fracture that should gradually heal may take up to 2-3 months because of her age  #2 UTI-antibiotics prescribed if fevers or worse call or followup  #3 pernicious anemia B12 given  today.  #4 TIA-I reviewed over neurology's notes as well as lab work and testing I also reviewed over her medicines with her the importance of keeping blood pressure under good control sugar under good control cholesterol under good control. Also the importance of continuing 81 mg aspirin. She will followup again in one month's time.  #5 severe insomnia-reduce Wellbutrin to 1 tablet a day and see if this helps there is no safe medicine for insomnia. Patient was made aware that  25 minutes spent with this patient has multiple health problems

## 2014-04-14 DIAGNOSIS — G629 Polyneuropathy, unspecified: Secondary | ICD-10-CM | POA: Diagnosis not present

## 2014-04-14 DIAGNOSIS — R0782 Intercostal pain: Secondary | ICD-10-CM | POA: Diagnosis not present

## 2014-04-14 DIAGNOSIS — R296 Repeated falls: Secondary | ICD-10-CM | POA: Diagnosis not present

## 2014-04-14 DIAGNOSIS — N183 Chronic kidney disease, stage 3 (moderate): Secondary | ICD-10-CM | POA: Diagnosis not present

## 2014-04-14 DIAGNOSIS — E119 Type 2 diabetes mellitus without complications: Secondary | ICD-10-CM | POA: Diagnosis not present

## 2014-04-14 DIAGNOSIS — I129 Hypertensive chronic kidney disease with stage 1 through stage 4 chronic kidney disease, or unspecified chronic kidney disease: Secondary | ICD-10-CM | POA: Diagnosis not present

## 2014-04-16 ENCOUNTER — Ambulatory Visit: Payer: Medicare Other | Admitting: Family Medicine

## 2014-04-16 DIAGNOSIS — N183 Chronic kidney disease, stage 3 (moderate): Secondary | ICD-10-CM | POA: Diagnosis not present

## 2014-04-16 DIAGNOSIS — G629 Polyneuropathy, unspecified: Secondary | ICD-10-CM | POA: Diagnosis not present

## 2014-04-16 DIAGNOSIS — R0782 Intercostal pain: Secondary | ICD-10-CM | POA: Diagnosis not present

## 2014-04-16 DIAGNOSIS — E119 Type 2 diabetes mellitus without complications: Secondary | ICD-10-CM | POA: Diagnosis not present

## 2014-04-16 DIAGNOSIS — I129 Hypertensive chronic kidney disease with stage 1 through stage 4 chronic kidney disease, or unspecified chronic kidney disease: Secondary | ICD-10-CM | POA: Diagnosis not present

## 2014-04-16 DIAGNOSIS — R296 Repeated falls: Secondary | ICD-10-CM | POA: Diagnosis not present

## 2014-04-20 DIAGNOSIS — G629 Polyneuropathy, unspecified: Secondary | ICD-10-CM | POA: Diagnosis not present

## 2014-04-20 DIAGNOSIS — R0782 Intercostal pain: Secondary | ICD-10-CM | POA: Diagnosis not present

## 2014-04-20 DIAGNOSIS — I129 Hypertensive chronic kidney disease with stage 1 through stage 4 chronic kidney disease, or unspecified chronic kidney disease: Secondary | ICD-10-CM | POA: Diagnosis not present

## 2014-04-20 DIAGNOSIS — R296 Repeated falls: Secondary | ICD-10-CM | POA: Diagnosis not present

## 2014-04-20 DIAGNOSIS — N183 Chronic kidney disease, stage 3 (moderate): Secondary | ICD-10-CM | POA: Diagnosis not present

## 2014-04-20 DIAGNOSIS — E119 Type 2 diabetes mellitus without complications: Secondary | ICD-10-CM | POA: Diagnosis not present

## 2014-04-22 DIAGNOSIS — R0782 Intercostal pain: Secondary | ICD-10-CM | POA: Diagnosis not present

## 2014-04-22 DIAGNOSIS — I129 Hypertensive chronic kidney disease with stage 1 through stage 4 chronic kidney disease, or unspecified chronic kidney disease: Secondary | ICD-10-CM | POA: Diagnosis not present

## 2014-04-22 DIAGNOSIS — G629 Polyneuropathy, unspecified: Secondary | ICD-10-CM | POA: Diagnosis not present

## 2014-04-22 DIAGNOSIS — N183 Chronic kidney disease, stage 3 (moderate): Secondary | ICD-10-CM | POA: Diagnosis not present

## 2014-04-22 DIAGNOSIS — R296 Repeated falls: Secondary | ICD-10-CM | POA: Diagnosis not present

## 2014-04-22 DIAGNOSIS — E119 Type 2 diabetes mellitus without complications: Secondary | ICD-10-CM | POA: Diagnosis not present

## 2014-04-28 DIAGNOSIS — G629 Polyneuropathy, unspecified: Secondary | ICD-10-CM | POA: Diagnosis not present

## 2014-04-28 DIAGNOSIS — R296 Repeated falls: Secondary | ICD-10-CM | POA: Diagnosis not present

## 2014-04-28 DIAGNOSIS — R0782 Intercostal pain: Secondary | ICD-10-CM | POA: Diagnosis not present

## 2014-04-28 DIAGNOSIS — I129 Hypertensive chronic kidney disease with stage 1 through stage 4 chronic kidney disease, or unspecified chronic kidney disease: Secondary | ICD-10-CM | POA: Diagnosis not present

## 2014-04-28 DIAGNOSIS — N183 Chronic kidney disease, stage 3 (moderate): Secondary | ICD-10-CM | POA: Diagnosis not present

## 2014-04-28 DIAGNOSIS — E119 Type 2 diabetes mellitus without complications: Secondary | ICD-10-CM | POA: Diagnosis not present

## 2014-05-01 DIAGNOSIS — G629 Polyneuropathy, unspecified: Secondary | ICD-10-CM | POA: Diagnosis not present

## 2014-05-01 DIAGNOSIS — R0782 Intercostal pain: Secondary | ICD-10-CM | POA: Diagnosis not present

## 2014-05-01 DIAGNOSIS — I129 Hypertensive chronic kidney disease with stage 1 through stage 4 chronic kidney disease, or unspecified chronic kidney disease: Secondary | ICD-10-CM | POA: Diagnosis not present

## 2014-05-01 DIAGNOSIS — R296 Repeated falls: Secondary | ICD-10-CM | POA: Diagnosis not present

## 2014-05-01 DIAGNOSIS — N183 Chronic kidney disease, stage 3 (moderate): Secondary | ICD-10-CM | POA: Diagnosis not present

## 2014-05-01 DIAGNOSIS — E119 Type 2 diabetes mellitus without complications: Secondary | ICD-10-CM | POA: Diagnosis not present

## 2014-05-20 ENCOUNTER — Ambulatory Visit (INDEPENDENT_AMBULATORY_CARE_PROVIDER_SITE_OTHER): Payer: Medicare Other | Admitting: Family Medicine

## 2014-05-20 ENCOUNTER — Encounter: Payer: Self-pay | Admitting: Family Medicine

## 2014-05-20 ENCOUNTER — Other Ambulatory Visit: Payer: Self-pay | Admitting: Family Medicine

## 2014-05-20 VITALS — BP 122/64 | Ht 65.0 in | Wt 190.0 lb

## 2014-05-20 DIAGNOSIS — G4700 Insomnia, unspecified: Secondary | ICD-10-CM | POA: Diagnosis not present

## 2014-05-20 DIAGNOSIS — E538 Deficiency of other specified B group vitamins: Secondary | ICD-10-CM

## 2014-05-20 DIAGNOSIS — R350 Frequency of micturition: Secondary | ICD-10-CM

## 2014-05-20 LAB — POCT URINALYSIS DIPSTICK
Protein, UA: POSITIVE
SPEC GRAV UA: 1.02
pH, UA: 5

## 2014-05-20 MED ORDER — CYANOCOBALAMIN 1000 MCG/ML IJ SOLN
1000.0000 ug | Freq: Once | INTRAMUSCULAR | Status: AC
  Administered 2014-05-20: 1000 ug via INTRAMUSCULAR

## 2014-05-20 NOTE — Progress Notes (Signed)
   Subjective:    Patient ID: Pauline Aus, female    DOB: May 09, 1924, 80 y.o.   MRN: [id number]  Urinary Frequency  This is a new problem. Episode onset: 2-3 weeks ago. The problem occurs every urination. The patient is experiencing no pain. Associated symptoms include frequency, hesitancy and urgency. She has tried antibiotics for the symptoms. The treatment provided mild relief.      Review of Systems  Constitutional: Negative for activity change, appetite change and fatigue.  Respiratory: Negative for cough and shortness of breath.   Cardiovascular: Negative for chest pain.  Endocrine: Negative for polydipsia and polyphagia.  Genitourinary: Positive for hesitancy, urgency and frequency. Negative for dysuria.  Neurological: Negative for weakness.  Psychiatric/Behavioral: Negative for confusion.       Objective:   Physical Exam  Constitutional: She appears well-nourished. No distress.  Cardiovascular: Normal rate, regular rhythm and normal heart sounds.   No murmur heard. Pulmonary/Chest: Effort normal and breath sounds normal. No respiratory distress.  Musculoskeletal: She exhibits no edema.  Lymphadenopathy:    She has no cervical adenopathy.  Neurological: She is alert. She exhibits normal muscle tone.  Psychiatric: Her behavior is normal.  Vitals reviewed.         Assessment & Plan:  Dysuria or urinary frequency culture sent no infection seen under the microscope  Pernicious anemia B12 today  Patient will be due diabetic foot exam and A1c on next visit

## 2014-05-21 LAB — URINE CULTURE
COLONY COUNT: NO GROWTH
ORGANISM ID, BACTERIA: NO GROWTH

## 2014-05-22 NOTE — Progress Notes (Signed)
Card sent 

## 2014-05-26 ENCOUNTER — Telehealth: Payer: Self-pay | Admitting: Family Medicine

## 2014-05-26 ENCOUNTER — Other Ambulatory Visit: Payer: Self-pay | Admitting: *Deleted

## 2014-05-26 MED ORDER — TRAMADOL HCL 50 MG PO TABS: 50.0000 mg | ORAL_TABLET | Freq: Three times a day (TID) | ORAL | Status: DC | PRN

## 2014-05-26 NOTE — Telephone Encounter (Signed)
Patient has been having low back pain and its painful for her to walk.  Can she get something for the pain?  [facility]

## 2014-05-26 NOTE — Telephone Encounter (Signed)
Left message notifiying pt. Med sent to pharm.

## 2014-05-26 NOTE — Telephone Encounter (Signed)
Use tylenol, when need be may use Tramadol 50 mg , 1 q 8 hours prn pain,#30

## 2014-06-03 ENCOUNTER — Other Ambulatory Visit: Payer: Self-pay | Admitting: Family Medicine

## 2014-06-04 ENCOUNTER — Other Ambulatory Visit: Payer: Self-pay | Admitting: *Deleted

## 2014-06-04 MED ORDER — METFORMIN HCL 500 MG PO TABS: ORAL_TABLET | ORAL | Status: DC

## 2014-06-12 ENCOUNTER — Other Ambulatory Visit: Payer: Self-pay | Admitting: Family Medicine

## 2014-06-12 NOTE — Telephone Encounter (Signed)
#  1 with this patient's anemia and her age I thought this particular medicine was stopped. I do not see it on her Epic list. Is there a way of seeing in the past when it was stopped on Epic? Also please try to find out has the pharmacy Prescribing this over the past several months? Thank you

## 2014-06-12 NOTE — Telephone Encounter (Signed)
This medicine has been d/c.

## 2014-06-18 ENCOUNTER — Ambulatory Visit (INDEPENDENT_AMBULATORY_CARE_PROVIDER_SITE_OTHER): Payer: Medicare Other | Admitting: Family Medicine

## 2014-06-18 ENCOUNTER — Encounter: Payer: Self-pay | Admitting: Family Medicine

## 2014-06-18 VITALS — BP 132/78 | Ht 65.0 in | Wt 189.0 lb

## 2014-06-18 DIAGNOSIS — I10 Essential (primary) hypertension: Secondary | ICD-10-CM

## 2014-06-18 DIAGNOSIS — R7309 Other abnormal glucose: Secondary | ICD-10-CM | POA: Diagnosis not present

## 2014-06-18 DIAGNOSIS — R7303 Prediabetes: Secondary | ICD-10-CM

## 2014-06-18 DIAGNOSIS — Z23 Encounter for immunization: Secondary | ICD-10-CM

## 2014-06-18 DIAGNOSIS — D510 Vitamin B12 deficiency anemia due to intrinsic factor deficiency: Secondary | ICD-10-CM

## 2014-06-18 DIAGNOSIS — E119 Type 2 diabetes mellitus without complications: Secondary | ICD-10-CM | POA: Diagnosis not present

## 2014-06-18 DIAGNOSIS — R5383 Other fatigue: Secondary | ICD-10-CM

## 2014-06-18 LAB — POCT GLYCOSYLATED HEMOGLOBIN (HGB A1C): HEMOGLOBIN A1C: 4.5

## 2014-06-18 MED ORDER — CYANOCOBALAMIN 1000 MCG/ML IJ SOLN
1000.0000 ug | Freq: Once | INTRAMUSCULAR | Status: AC
  Administered 2014-06-18: 1000 ug via INTRAMUSCULAR

## 2014-06-18 MED ORDER — ALPRAZOLAM 0.5 MG PO TABS: 0.5000 mg | ORAL_TABLET | Freq: Every evening | ORAL | Status: DC | PRN

## 2014-06-18 NOTE — Progress Notes (Signed)
   Subjective:    Patient ID: Meghan Duran, female    DOB: July 02, 1924, 80 y.o.   MRN: [id number]  HPI Patient would like a different med to help her sleep at night. Trazodone did not help her sleep. It kept her awake.  Would also like something for her chronic pain. The tramadol did not help. It made her go, "nuts."   Pt is leaving Sunday for [place]. She states she is try to watch her diet. She also states that she has intermittent low back pain This does not wake her up at night. Does not appear to be severe She has chronic insomnia this is been going on for a long span of time she states trazodone did not help her sleep She states her depression is stable She does try to avoid excessive starches.   Review of Systems She denies any chest pressure pain fevers or chills    she denies excessive thirst nausea vomiting diarrhea. Objective:   Physical Exam Neck no masses lungs are clear no crackle heart is regular pulse normal extremities trace edema diabetic foot exam normal mild peripheral neuropathy       Assessment & Plan:  Intermittent low back pain. I would recommend Tylenol. I don't feel the patient needs x-rays. It does not wake her up at night.  Pernicious anemia she needs her B12 shot  Chronic insomnia-we have tried multiple agents. At this point try Xanax 0.5 mg daily at bedtime. I believe that she will have ongoing chronic insomnia and have difficult time falling asleep. She may sleep into the morning as long as it's not too late.  History of diabetes-her A1c actually looks great she is to continue her dietary measures. Given how low her A1c as I believe she may stop glipizide.  Depression-she is doing well on the well putrid. She denies worsening depression.  HTN blood pressure under good control currently

## 2014-06-24 ENCOUNTER — Ambulatory Visit: Payer: Medicare Other | Admitting: Family Medicine

## 2014-06-30 ENCOUNTER — Telehealth: Payer: Self-pay | Admitting: Family Medicine

## 2014-06-30 MED ORDER — ALPRAZOLAM 0.5 MG PO TABS: 0.5000 mg | ORAL_TABLET | Freq: Every evening | ORAL | Status: DC | PRN

## 2014-06-30 NOTE — Telephone Encounter (Signed)
Patient needs a printed Rx for xanax mailed to her at the [facility] address, because the pharmacy will not allow transfer between states with this medication.  Also, [name] says that there were supposed to be brief notes for patient to pick up and take to her doctor in [place].  If these can be done, please mail.

## 2014-06-30 NOTE — Telephone Encounter (Signed)
Rx and notes mailed to patient by medical records.

## 2014-06-30 NOTE — Telephone Encounter (Signed)
Please do printed prescription on her Xanax and one additional refill, please mail as requested. Also female records that was printed

## 2014-07-07 ENCOUNTER — Other Ambulatory Visit: Payer: Self-pay | Admitting: Family Medicine

## 2014-07-07 DIAGNOSIS — E1151 Type 2 diabetes mellitus with diabetic peripheral angiopathy without gangrene: Secondary | ICD-10-CM | POA: Diagnosis not present

## 2014-07-07 DIAGNOSIS — B351 Tinea unguium: Secondary | ICD-10-CM | POA: Diagnosis not present

## 2014-07-07 DIAGNOSIS — M2041 Other hammer toe(s) (acquired), right foot: Secondary | ICD-10-CM | POA: Diagnosis not present

## 2014-07-07 DIAGNOSIS — I7091 Generalized atherosclerosis: Secondary | ICD-10-CM | POA: Diagnosis not present

## 2014-07-07 DIAGNOSIS — M2042 Other hammer toe(s) (acquired), left foot: Secondary | ICD-10-CM | POA: Diagnosis not present

## 2014-07-08 NOTE — Telephone Encounter (Signed)
I am fine with refilling these medicines with 2 refills

## 2014-07-14 DIAGNOSIS — E119 Type 2 diabetes mellitus without complications: Secondary | ICD-10-CM | POA: Diagnosis not present

## 2014-07-14 DIAGNOSIS — H43811 Vitreous degeneration, right eye: Secondary | ICD-10-CM | POA: Diagnosis not present

## 2014-07-17 DIAGNOSIS — R197 Diarrhea, unspecified: Secondary | ICD-10-CM | POA: Diagnosis not present

## 2014-07-17 DIAGNOSIS — R5383 Other fatigue: Secondary | ICD-10-CM | POA: Diagnosis not present

## 2014-07-17 DIAGNOSIS — D518 Other vitamin B12 deficiency anemias: Secondary | ICD-10-CM | POA: Diagnosis not present

## 2014-07-21 DIAGNOSIS — L820 Inflamed seborrheic keratosis: Secondary | ICD-10-CM | POA: Diagnosis not present

## 2014-07-21 DIAGNOSIS — Z08 Encounter for follow-up examination after completed treatment for malignant neoplasm: Secondary | ICD-10-CM | POA: Diagnosis not present

## 2014-07-21 DIAGNOSIS — L570 Actinic keratosis: Secondary | ICD-10-CM | POA: Diagnosis not present

## 2014-07-21 DIAGNOSIS — Z85820 Personal history of malignant melanoma of skin: Secondary | ICD-10-CM | POA: Diagnosis not present

## 2014-07-21 DIAGNOSIS — C44712 Basal cell carcinoma of skin of right lower limb, including hip: Secondary | ICD-10-CM | POA: Diagnosis not present

## 2014-07-21 DIAGNOSIS — C44719 Basal cell carcinoma of skin of left lower limb, including hip: Secondary | ICD-10-CM | POA: Diagnosis not present

## 2014-07-21 DIAGNOSIS — Z85828 Personal history of other malignant neoplasm of skin: Secondary | ICD-10-CM | POA: Diagnosis not present

## 2014-08-03 DIAGNOSIS — C44719 Basal cell carcinoma of skin of left lower limb, including hip: Secondary | ICD-10-CM | POA: Diagnosis not present

## 2014-08-03 DIAGNOSIS — C44712 Basal cell carcinoma of skin of right lower limb, including hip: Secondary | ICD-10-CM | POA: Diagnosis not present

## 2014-08-21 DIAGNOSIS — D518 Other vitamin B12 deficiency anemias: Secondary | ICD-10-CM | POA: Diagnosis not present

## 2014-08-31 DIAGNOSIS — E1151 Type 2 diabetes mellitus with diabetic peripheral angiopathy without gangrene: Secondary | ICD-10-CM | POA: Diagnosis not present

## 2014-08-31 DIAGNOSIS — E1142 Type 2 diabetes mellitus with diabetic polyneuropathy: Secondary | ICD-10-CM | POA: Diagnosis not present

## 2014-08-31 DIAGNOSIS — M2042 Other hammer toe(s) (acquired), left foot: Secondary | ICD-10-CM | POA: Diagnosis not present

## 2014-08-31 DIAGNOSIS — M2041 Other hammer toe(s) (acquired), right foot: Secondary | ICD-10-CM | POA: Diagnosis not present

## 2014-09-01 DIAGNOSIS — Z85820 Personal history of malignant melanoma of skin: Secondary | ICD-10-CM | POA: Diagnosis not present

## 2014-09-01 DIAGNOSIS — Z08 Encounter for follow-up examination after completed treatment for malignant neoplasm: Secondary | ICD-10-CM | POA: Diagnosis not present

## 2014-09-01 DIAGNOSIS — C44629 Squamous cell carcinoma of skin of left upper limb, including shoulder: Secondary | ICD-10-CM | POA: Diagnosis not present

## 2014-09-01 DIAGNOSIS — Z85828 Personal history of other malignant neoplasm of skin: Secondary | ICD-10-CM | POA: Diagnosis not present

## 2014-09-01 DIAGNOSIS — D0472 Carcinoma in situ of skin of left lower limb, including hip: Secondary | ICD-10-CM | POA: Diagnosis not present

## 2014-09-01 DIAGNOSIS — L988 Other specified disorders of the skin and subcutaneous tissue: Secondary | ICD-10-CM | POA: Diagnosis not present

## 2014-09-01 DIAGNOSIS — D485 Neoplasm of uncertain behavior of skin: Secondary | ICD-10-CM | POA: Diagnosis not present

## 2014-09-01 DIAGNOSIS — C44722 Squamous cell carcinoma of skin of right lower limb, including hip: Secondary | ICD-10-CM | POA: Diagnosis not present

## 2014-09-01 DIAGNOSIS — R234 Changes in skin texture: Secondary | ICD-10-CM | POA: Diagnosis not present

## 2014-09-01 DIAGNOSIS — L570 Actinic keratosis: Secondary | ICD-10-CM | POA: Diagnosis not present

## 2014-09-07 ENCOUNTER — Telehealth: Payer: Self-pay | Admitting: *Deleted

## 2014-09-07 DIAGNOSIS — Z48817 Encounter for surgical aftercare following surgery on the skin and subcutaneous tissue: Secondary | ICD-10-CM | POA: Diagnosis not present

## 2014-09-07 NOTE — Telephone Encounter (Signed)
Patient canceled follow up appointment she is out of town. Will call to reschedule when she gets back in town

## 2014-09-08 DIAGNOSIS — C44729 Squamous cell carcinoma of skin of left lower limb, including hip: Secondary | ICD-10-CM | POA: Diagnosis not present

## 2014-09-14 ENCOUNTER — Other Ambulatory Visit: Payer: Self-pay | Admitting: Family Medicine

## 2014-09-15 DIAGNOSIS — E1142 Type 2 diabetes mellitus with diabetic polyneuropathy: Secondary | ICD-10-CM | POA: Diagnosis not present

## 2014-09-15 DIAGNOSIS — I10 Essential (primary) hypertension: Secondary | ICD-10-CM | POA: Diagnosis not present

## 2014-09-15 DIAGNOSIS — L03116 Cellulitis of left lower limb: Secondary | ICD-10-CM | POA: Diagnosis not present

## 2014-09-15 DIAGNOSIS — D519 Vitamin B12 deficiency anemia, unspecified: Secondary | ICD-10-CM | POA: Diagnosis not present

## 2014-09-15 DIAGNOSIS — L03115 Cellulitis of right lower limb: Secondary | ICD-10-CM | POA: Diagnosis not present

## 2014-10-07 ENCOUNTER — Ambulatory Visit: Payer: Medicare Other | Admitting: Neurology

## 2014-10-12 ENCOUNTER — Other Ambulatory Visit: Payer: Self-pay | Admitting: *Deleted

## 2014-10-12 MED ORDER — BUSPIRONE HCL 15 MG PO TABS: 15.0000 mg | ORAL_TABLET | Freq: Two times a day (BID) | ORAL | Status: DC

## 2014-10-12 MED ORDER — GLIPIZIDE 5 MG PO TABS: ORAL_TABLET | ORAL | Status: DC

## 2014-10-12 MED ORDER — METFORMIN HCL 500 MG PO TABS: ORAL_TABLET | ORAL | Status: DC

## 2014-10-12 MED ORDER — METOPROLOL SUCCINATE ER 25 MG PO TB24: ORAL_TABLET | ORAL | Status: DC

## 2014-10-12 MED ORDER — FLUTICASONE PROPIONATE 50 MCG/ACT NA SUSP: 1.0000 | Freq: Every day | NASAL | Status: DC

## 2014-10-12 MED ORDER — LORATADINE 10 MG PO TABS: 10.0000 mg | ORAL_TABLET | Freq: Every day | ORAL | Status: DC

## 2014-10-12 MED ORDER — BUPROPION HCL ER (SR) 150 MG PO TB12: 150.0000 mg | ORAL_TABLET | Freq: Every day | ORAL | Status: DC

## 2014-10-12 MED ORDER — MECLIZINE HCL 12.5 MG PO TABS: ORAL_TABLET | ORAL | Status: DC

## 2014-10-12 MED ORDER — LOSARTAN POTASSIUM 100 MG PO TABS: ORAL_TABLET | ORAL | Status: DC

## 2014-10-12 MED ORDER — AMLODIPINE BESYLATE 2.5 MG PO TABS: 2.5000 mg | ORAL_TABLET | Freq: Every day | ORAL | Status: DC

## 2014-10-12 MED ORDER — CLONIDINE HCL 0.1 MG PO TABS: ORAL_TABLET | ORAL | Status: DC

## 2014-10-12 MED ORDER — ALPRAZOLAM 0.5 MG PO TABS: 0.5000 mg | ORAL_TABLET | Freq: Every evening | ORAL | Status: DC | PRN

## 2014-10-12 MED ORDER — PRAVASTATIN SODIUM 40 MG PO TABS: 40.0000 mg | ORAL_TABLET | Freq: Every evening | ORAL | Status: DC

## 2014-10-14 ENCOUNTER — Other Ambulatory Visit: Payer: Self-pay | Admitting: Family Medicine
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
## 2014-10-15 ENCOUNTER — Other Ambulatory Visit: Payer: Self-pay | Admitting: *Deleted

## 2014-10-15 ENCOUNTER — Ambulatory Visit: Payer: Medicare Other | Admitting: Neurology

## 2014-10-15 MED ORDER — METFORMIN HCL 500 MG PO TABS: ORAL_TABLET | ORAL | Status: DC

## 2014-10-15 MED ORDER — LOSARTAN POTASSIUM 100 MG PO TABS: ORAL_TABLET | ORAL | Status: DC

## 2014-10-15 MED ORDER — MECLIZINE HCL 12.5 MG PO TABS: ORAL_TABLET | ORAL | Status: DC

## 2014-10-15 MED ORDER — CLONIDINE HCL 0.1 MG PO TABS: ORAL_TABLET | ORAL | Status: DC

## 2014-10-15 MED ORDER — AMLODIPINE BESYLATE 2.5 MG PO TABS: 2.5000 mg | ORAL_TABLET | Freq: Every day | ORAL | Status: DC

## 2014-10-15 MED ORDER — LORATADINE 10 MG PO TABS: 10.0000 mg | ORAL_TABLET | Freq: Every day | ORAL | Status: DC

## 2014-10-16 ENCOUNTER — Other Ambulatory Visit: Payer: Self-pay | Admitting: Family Medicine

## 2014-10-19 ENCOUNTER — Other Ambulatory Visit: Payer: Self-pay | Admitting: Family Medicine

## 2014-10-21 ENCOUNTER — Ambulatory Visit (INDEPENDENT_AMBULATORY_CARE_PROVIDER_SITE_OTHER): Payer: Medicare Other | Admitting: Family Medicine

## 2014-10-21 VITALS — BP 128/64 | Wt 181.0 lb

## 2014-10-21 DIAGNOSIS — E119 Type 2 diabetes mellitus without complications: Secondary | ICD-10-CM | POA: Diagnosis not present

## 2014-10-21 DIAGNOSIS — D649 Anemia, unspecified: Secondary | ICD-10-CM | POA: Diagnosis not present

## 2014-10-21 DIAGNOSIS — I10 Essential (primary) hypertension: Secondary | ICD-10-CM

## 2014-10-21 DIAGNOSIS — D510 Vitamin B12 deficiency anemia due to intrinsic factor deficiency: Secondary | ICD-10-CM

## 2014-10-21 DIAGNOSIS — Z111 Encounter for screening for respiratory tuberculosis: Secondary | ICD-10-CM | POA: Diagnosis not present

## 2014-10-21 LAB — POCT GLYCOSYLATED HEMOGLOBIN (HGB A1C): Hemoglobin A1C: <4

## 2014-10-21 MED ORDER — METOPROLOL SUCCINATE ER 25 MG PO TB24: ORAL_TABLET | ORAL | Status: DC

## 2014-10-21 MED ORDER — ASPIRIN 81 MG PO CHEW: 81.0000 mg | CHEWABLE_TABLET | Freq: Every day | ORAL | Status: DC

## 2014-10-21 MED ORDER — CLONIDINE HCL 0.1 MG PO TABS: ORAL_TABLET | ORAL | Status: DC

## 2014-10-21 MED ORDER — BUPROPION HCL ER (SR) 150 MG PO TB12: 150.0000 mg | ORAL_TABLET | Freq: Every day | ORAL | Status: AC

## 2014-10-21 MED ORDER — PRAVASTATIN SODIUM 40 MG PO TABS: 40.0000 mg | ORAL_TABLET | Freq: Every evening | ORAL | Status: DC

## 2014-10-21 MED ORDER — BUPROPION HCL ER (SR) 150 MG PO TB12: 150.0000 mg | ORAL_TABLET | Freq: Every day | ORAL | Status: DC

## 2014-10-21 MED ORDER — LORATADINE 10 MG PO TABS: 10.0000 mg | ORAL_TABLET | Freq: Every day | ORAL | Status: DC

## 2014-10-21 MED ORDER — LOSARTAN POTASSIUM 100 MG PO TABS: ORAL_TABLET | ORAL | Status: DC

## 2014-10-21 MED ORDER — CYANOCOBALAMIN 1000 MCG/ML IJ SOLN
1000.0000 ug | Freq: Once | INTRAMUSCULAR | Status: AC
  Administered 2014-10-21: 1000 ug via INTRAMUSCULAR

## 2014-10-21 MED ORDER — BUSPIRONE HCL 15 MG PO TABS: 15.0000 mg | ORAL_TABLET | Freq: Two times a day (BID) | ORAL | Status: DC

## 2014-10-21 MED ORDER — ALPRAZOLAM 0.5 MG PO TABS: 0.5000 mg | ORAL_TABLET | Freq: Every evening | ORAL | Status: DC | PRN

## 2014-10-21 MED ORDER — AMLODIPINE BESYLATE 2.5 MG PO TABS: 2.5000 mg | ORAL_TABLET | Freq: Every day | ORAL | Status: DC

## 2014-10-21 MED ORDER — METFORMIN HCL 500 MG PO TABS: ORAL_TABLET | ORAL | Status: DC
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
## 2014-10-27 DIAGNOSIS — I10 Essential (primary) hypertension: Secondary | ICD-10-CM | POA: Diagnosis not present

## 2014-10-27 DIAGNOSIS — E119 Type 2 diabetes mellitus without complications: Secondary | ICD-10-CM | POA: Diagnosis not present

## 2014-10-27 DIAGNOSIS — F419 Anxiety disorder, unspecified: Secondary | ICD-10-CM | POA: Diagnosis not present

## 2014-10-27 DIAGNOSIS — Z79899 Other long term (current) drug therapy: Secondary | ICD-10-CM | POA: Diagnosis not present

## 2014-10-27 DIAGNOSIS — D510 Vitamin B12 deficiency anemia due to intrinsic factor deficiency: Secondary | ICD-10-CM | POA: Diagnosis not present

## 2014-11-02 ENCOUNTER — Other Ambulatory Visit: Payer: Self-pay

## 2014-11-02 MED ORDER — CIPROFLOXACIN HCL 250 MG PO TABS: 250.0000 mg | ORAL_TABLET | Freq: Two times a day (BID) | ORAL | Status: DC

## 2014-11-03 ENCOUNTER — Telehealth: Payer: Self-pay

## 2014-11-03 NOTE — Telephone Encounter (Signed)
[facility] called yesterday and today needing an order for urinalysis and urine culture due to back pain and frequency. Per Dr. Nicki Reaper- U/A and urine culture ordered and Cipro 250 mg 1 BID for 3 days. Scripts faxed to [phone] per [facility]. Daughter will take script for Cipro to pharmacy of their choice per [facility].

## 2014-11-04 DIAGNOSIS — N390 Urinary tract infection, site not specified: Secondary | ICD-10-CM | POA: Diagnosis not present

## 2014-11-16 ENCOUNTER — Other Ambulatory Visit: Payer: Self-pay | Admitting: Family Medicine

## 2014-11-16 NOTE — Telephone Encounter (Signed)
I d/c'd glipizide last visit plz make sure it is d/c'd.ok on the others times 4

## 2014-11-20 ENCOUNTER — Other Ambulatory Visit: Payer: Self-pay | Admitting: *Deleted

## 2014-11-20 MED ORDER — CYANOCOBALAMIN 1000 MCG/ML IJ SOLN: 1000.0000 ug | INTRAMUSCULAR | Status: DC

## 2014-11-21 DIAGNOSIS — I10 Essential (primary) hypertension: Secondary | ICD-10-CM | POA: Diagnosis not present

## 2014-11-21 DIAGNOSIS — F419 Anxiety disorder, unspecified: Secondary | ICD-10-CM | POA: Diagnosis not present

## 2014-11-21 DIAGNOSIS — D510 Vitamin B12 deficiency anemia due to intrinsic factor deficiency: Secondary | ICD-10-CM | POA: Diagnosis not present

## 2014-11-21 DIAGNOSIS — Z79899 Other long term (current) drug therapy: Secondary | ICD-10-CM | POA: Diagnosis not present

## 2014-11-21 DIAGNOSIS — E119 Type 2 diabetes mellitus without complications: Secondary | ICD-10-CM | POA: Diagnosis not present

## 2014-11-24 DIAGNOSIS — E119 Type 2 diabetes mellitus without complications: Secondary | ICD-10-CM | POA: Diagnosis not present

## 2014-11-24 DIAGNOSIS — F419 Anxiety disorder, unspecified: Secondary | ICD-10-CM | POA: Diagnosis not present

## 2014-11-24 DIAGNOSIS — I10 Essential (primary) hypertension: Secondary | ICD-10-CM | POA: Diagnosis not present

## 2014-11-24 DIAGNOSIS — D510 Vitamin B12 deficiency anemia due to intrinsic factor deficiency: Secondary | ICD-10-CM | POA: Diagnosis not present

## 2014-11-27 ENCOUNTER — Encounter: Payer: Self-pay | Admitting: Family Medicine

## 2014-11-27 ENCOUNTER — Ambulatory Visit (INDEPENDENT_AMBULATORY_CARE_PROVIDER_SITE_OTHER): Payer: Medicare Other | Admitting: Family Medicine

## 2014-11-27 VITALS — BP 150/78 | Temp 97.7°F | Ht 65.0 in | Wt 186.1 lb

## 2014-11-27 DIAGNOSIS — R600 Localized edema: Secondary | ICD-10-CM

## 2014-11-27 DIAGNOSIS — Z139 Encounter for screening, unspecified: Secondary | ICD-10-CM

## 2014-11-27 DIAGNOSIS — I10 Essential (primary) hypertension: Secondary | ICD-10-CM | POA: Diagnosis not present

## 2014-11-27 MED ORDER — FUROSEMIDE 20 MG PO TABS: 20.0000 mg | ORAL_TABLET | Freq: Every day | ORAL | Status: AC | PRN

## 2014-11-27 NOTE — Progress Notes (Addendum)
   Subjective:    Patient ID: Meghan Duran, female    DOB: December 15, 1923, 81 y.o.   MRN: [id number]  HPI  Patient has edema to bilateral legs and  arms. Onset a week and a half ago. Swelling has improved some over the past few days. Patient states that with the fluid she gets more tired than usual. Patient states no other concerns this visit. Patient denies any chest tightness pressure pain denies PND denies shortness of breath Review of Systems  Constitutional: Negative for activity change, appetite change and fatigue.  HENT: Negative for congestion.   Respiratory: Negative for cough.   Cardiovascular: Negative for chest pain.  Gastrointestinal: Negative for abdominal pain.  Endocrine: Negative for polydipsia and polyphagia.  Neurological: Negative for weakness.  Psychiatric/Behavioral: Negative for confusion.       Objective:   Physical Exam  Constitutional: She appears well-nourished. No distress.  Cardiovascular: Normal rate, regular rhythm and normal heart sounds.   No murmur heard. Pulmonary/Chest: Effort normal and breath sounds normal. No respiratory distress.  Musculoskeletal: She exhibits no edema.  Lymphadenopathy:    She has no cervical adenopathy.  Neurological: She is alert. She exhibits normal muscle tone.  Psychiatric: Her behavior is normal.  Vitals reviewed.         Assessment & Plan:  Moderate edema fluid retention Diarrhetic recommended on a when necessary basis proper way to take it discussed. Most likely will average 3-4 times per week  Follow-up at next scheduled follow-up in July  We will check metabolic 7 and liver function make sure there is not another underlying issue could be causing the swelling  Pt was seen as part of face to face visit, pt needs home health to give b 12 shots, pt is home bound to the facility

## 2014-11-27 NOTE — Patient Instructions (Signed)
To do labs within the next week  Use Lasix one each am as needed for pedal edema  Keep appointment in July

## 2014-11-28 LAB — BASIC METABOLIC PANEL
BUN/Creatinine Ratio: 16 (ref 11–26)
BUN: 22 mg/dL (ref 8–27)
CHLORIDE: 103 mmol/L (ref 97–108)
CO2: 23 mmol/L (ref 18–29)
CREATININE: 1.4 mg/dL — AB (ref 0.57–1.00)
Calcium: 9.4 mg/dL (ref 8.7–10.3)
GFR calc non Af Amer: 34 mL/min/{1.73_m2} — ABNORMAL LOW (ref >59)
GFR, EST AFRICAN AMERICAN: 39 mL/min/{1.73_m2} — AB (ref >59)
Glucose: 169 mg/dL — ABNORMAL HIGH (ref 65–99)
POTASSIUM: 4.4 mmol/L (ref 3.5–5.2)
Sodium: 142 mmol/L (ref 134–144)

## 2014-11-28 LAB — HEPATIC FUNCTION PANEL
ALBUMIN: 4.3 g/dL (ref 3.5–4.7)
ALK PHOS: 59 IU/L (ref 39–117)
ALT: 16 IU/L (ref 0–32)
AST: 17 IU/L (ref 0–40)
BILIRUBIN TOTAL: 0.3 mg/dL (ref 0.0–1.2)
Bilirubin, Direct: 0.1 mg/dL (ref 0.00–0.40)
Total Protein: 6 g/dL (ref 6.0–8.5)

## 2014-12-04 ENCOUNTER — Other Ambulatory Visit: Payer: Self-pay

## 2014-12-04 ENCOUNTER — Other Ambulatory Visit: Payer: Self-pay | Admitting: Family Medicine

## 2014-12-04 DIAGNOSIS — Z79899 Other long term (current) drug therapy: Secondary | ICD-10-CM

## 2014-12-08 ENCOUNTER — Other Ambulatory Visit: Payer: Self-pay | Admitting: Family Medicine

## 2014-12-23 ENCOUNTER — Telehealth: Payer: Self-pay | Admitting: Family Medicine

## 2014-12-23 DIAGNOSIS — Z79899 Other long term (current) drug therapy: Secondary | ICD-10-CM | POA: Diagnosis not present

## 2014-12-23 DIAGNOSIS — E119 Type 2 diabetes mellitus without complications: Secondary | ICD-10-CM | POA: Diagnosis not present

## 2014-12-23 DIAGNOSIS — D510 Vitamin B12 deficiency anemia due to intrinsic factor deficiency: Secondary | ICD-10-CM | POA: Diagnosis not present

## 2014-12-23 DIAGNOSIS — I10 Essential (primary) hypertension: Secondary | ICD-10-CM | POA: Diagnosis not present

## 2014-12-23 DIAGNOSIS — F419 Anxiety disorder, unspecified: Secondary | ICD-10-CM | POA: Diagnosis not present

## 2014-12-23 NOTE — Telephone Encounter (Signed)
May send in a prescription for B12 vial, 1 mL IM every month. Nurse's-I don't believe that this is ordered as an individual shot instead I believe it is ordered as a vial that is given to the family you may want to discuss with the pharmacist to see what they recommend-the patient will be with [place] here from now through most likely early November, diagnosis pernicious anemia

## 2014-12-23 NOTE — Telephone Encounter (Signed)
Pt is at [facility], the coverage for her stay is partly handled by the family She is going to be getting her b12 shot there instead of here. They need  A script sent to [facility] in end for June - oct (so, 5 mo supply)  Questions please call [name] [phone]

## 2014-12-23 NOTE — Telephone Encounter (Signed)
Rx faxed to pharmacy. Patient's daughter notified.

## 2014-12-24 ENCOUNTER — Telehealth: Payer: Self-pay | Admitting: Family Medicine

## 2014-12-24 NOTE — Telephone Encounter (Signed)
Her first facility requests the date of her face-to-face encounter was corrected to 10/21/2014. I saw the patient both then and in May and in both situations I felt it was appropriate for her to be getting B12 shots at the facility so therefore the correction was made. The form was completed and sent the front will complete sending

## 2014-12-26 DIAGNOSIS — D510 Vitamin B12 deficiency anemia due to intrinsic factor deficiency: Secondary | ICD-10-CM | POA: Diagnosis not present

## 2014-12-26 DIAGNOSIS — Z79899 Other long term (current) drug therapy: Secondary | ICD-10-CM | POA: Diagnosis not present

## 2014-12-26 DIAGNOSIS — F419 Anxiety disorder, unspecified: Secondary | ICD-10-CM | POA: Diagnosis not present

## 2014-12-26 DIAGNOSIS — F329 Major depressive disorder, single episode, unspecified: Secondary | ICD-10-CM | POA: Diagnosis not present

## 2014-12-26 DIAGNOSIS — I10 Essential (primary) hypertension: Secondary | ICD-10-CM | POA: Diagnosis not present

## 2014-12-26 DIAGNOSIS — E119 Type 2 diabetes mellitus without complications: Secondary | ICD-10-CM | POA: Diagnosis not present

## 2015-01-05 ENCOUNTER — Other Ambulatory Visit: Payer: Self-pay | Admitting: Family Medicine

## 2015-01-18 DIAGNOSIS — E119 Type 2 diabetes mellitus without complications: Secondary | ICD-10-CM | POA: Diagnosis not present

## 2015-01-18 DIAGNOSIS — F419 Anxiety disorder, unspecified: Secondary | ICD-10-CM | POA: Diagnosis not present

## 2015-01-18 DIAGNOSIS — D510 Vitamin B12 deficiency anemia due to intrinsic factor deficiency: Secondary | ICD-10-CM | POA: Diagnosis not present

## 2015-01-18 DIAGNOSIS — I10 Essential (primary) hypertension: Secondary | ICD-10-CM | POA: Diagnosis not present

## 2015-01-19 DIAGNOSIS — F419 Anxiety disorder, unspecified: Secondary | ICD-10-CM | POA: Diagnosis not present

## 2015-01-19 DIAGNOSIS — F329 Major depressive disorder, single episode, unspecified: Secondary | ICD-10-CM | POA: Diagnosis not present

## 2015-01-19 DIAGNOSIS — Z79899 Other long term (current) drug therapy: Secondary | ICD-10-CM | POA: Diagnosis not present

## 2015-01-19 DIAGNOSIS — I10 Essential (primary) hypertension: Secondary | ICD-10-CM | POA: Diagnosis not present

## 2015-01-19 DIAGNOSIS — D510 Vitamin B12 deficiency anemia due to intrinsic factor deficiency: Secondary | ICD-10-CM | POA: Diagnosis not present

## 2015-01-19 DIAGNOSIS — E119 Type 2 diabetes mellitus without complications: Secondary | ICD-10-CM | POA: Diagnosis not present

## 2015-01-20 ENCOUNTER — Ambulatory Visit: Payer: Medicare Other | Admitting: Family Medicine

## 2015-01-20 LAB — BASIC METABOLIC PANEL
BUN / CREAT RATIO: 12 (ref 11–26)
BUN: 17 mg/dL (ref 8–27)
CALCIUM: 9 mg/dL (ref 8.7–10.3)
CHLORIDE: 103 mmol/L (ref 97–108)
CO2: 21 mmol/L (ref 18–29)
Creatinine, Ser: 1.45 mg/dL — ABNORMAL HIGH (ref 0.57–1.00)
GFR calc Af Amer: 37 mL/min/{1.73_m2} — ABNORMAL LOW (ref >59)
GFR calc non Af Amer: 32 mL/min/{1.73_m2} — ABNORMAL LOW (ref >59)
Glucose: 151 mg/dL — ABNORMAL HIGH (ref 65–99)
Potassium: 4.6 mmol/L (ref 3.5–5.2)
Sodium: 142 mmol/L (ref 134–144)

## 2015-01-21 ENCOUNTER — Telehealth: Payer: Self-pay | Admitting: Family Medicine

## 2015-01-21 NOTE — Telephone Encounter (Signed)
Spoke with patient's daughter [name] to inform her that unfortunately Dr.Scott is booked up on July 26th but patient does have and appointment on the 26th with a nurse for Vitamin B12 shot. I also informed patient that patient can get next available in august for medication check with Dr.Scott. Patient's Daughter verbalized understanding.

## 2015-01-21 NOTE — Telephone Encounter (Signed)
Pt has a nurse appt on the 26th, she missed her appt on the 20th for  Her med check, she wants to know if she can see you at the same time For this med check appt on the 26th?   Please advise

## 2015-01-26 ENCOUNTER — Ambulatory Visit (INDEPENDENT_AMBULATORY_CARE_PROVIDER_SITE_OTHER): Payer: Medicare Other | Admitting: *Deleted

## 2015-01-26 DIAGNOSIS — D510 Vitamin B12 deficiency anemia due to intrinsic factor deficiency: Secondary | ICD-10-CM

## 2015-01-26 MED ORDER — CYANOCOBALAMIN 1000 MCG/ML IJ SOLN
1000.0000 ug | Freq: Once | INTRAMUSCULAR | Status: AC
  Administered 2015-01-26: 1000 ug via INTRAMUSCULAR

## 2015-01-27 ENCOUNTER — Other Ambulatory Visit: Payer: Self-pay | Admitting: Family Medicine

## 2015-02-26 ENCOUNTER — Encounter: Payer: Self-pay | Admitting: Family Medicine

## 2015-02-26 ENCOUNTER — Ambulatory Visit (INDEPENDENT_AMBULATORY_CARE_PROVIDER_SITE_OTHER): Payer: Medicare Other | Admitting: Family Medicine

## 2015-02-26 VITALS — BP 114/60 | Ht 65.0 in | Wt 185.5 lb

## 2015-02-26 DIAGNOSIS — E785 Hyperlipidemia, unspecified: Secondary | ICD-10-CM | POA: Diagnosis not present

## 2015-02-26 DIAGNOSIS — R3989 Other symptoms and signs involving the genitourinary system: Secondary | ICD-10-CM | POA: Diagnosis not present

## 2015-02-26 DIAGNOSIS — R197 Diarrhea, unspecified: Secondary | ICD-10-CM

## 2015-02-26 DIAGNOSIS — E119 Type 2 diabetes mellitus without complications: Secondary | ICD-10-CM

## 2015-02-26 DIAGNOSIS — D510 Vitamin B12 deficiency anemia due to intrinsic factor deficiency: Secondary | ICD-10-CM | POA: Diagnosis not present

## 2015-02-26 DIAGNOSIS — I951 Orthostatic hypotension: Secondary | ICD-10-CM

## 2015-02-26 DIAGNOSIS — I10 Essential (primary) hypertension: Secondary | ICD-10-CM | POA: Diagnosis not present

## 2015-02-26 LAB — POCT URINALYSIS DIPSTICK
Spec Grav, UA: >=1.03
pH, UA: 5

## 2015-02-26 LAB — POCT GLYCOSYLATED HEMOGLOBIN (HGB A1C)

## 2015-02-26 MED ORDER — LOSARTAN POTASSIUM 50 MG PO TABS: ORAL_TABLET | ORAL | Status: DC

## 2015-02-26 MED ORDER — CYANOCOBALAMIN 1000 MCG/ML IJ SOLN
1000.0000 ug | Freq: Once | INTRAMUSCULAR | Status: AC
Start: 2015-02-26 — End: 2015-02-26
  Administered 2015-02-26: 1000 ug via INTRAMUSCULAR

## 2015-02-26 NOTE — Progress Notes (Signed)
   Subjective:    Patient ID: Meghan Duran, female    DOB: Jun 14, 1924, 81 y.o.   MRN: [id number]  Diabetes She presents for her follow-up diabetic visit. She has type 2 diabetes mellitus. There are no hypoglycemic associated symptoms. There are no diabetic associated symptoms. Pertinent negatives for diabetes include no fatigue. There are no hypoglycemic complications. There are no diabetic complications. There are no known risk factors for coronary artery disease. Current diabetic treatment includes oral agent (dual therapy). She is compliant with treatment all of the time.  Abdominal Pain This is a new problem. The current episode started in the past 7 days. The onset quality is gradual. The problem occurs daily. The problem has been gradually improving. The pain is at a severity of 6/10. The pain is moderate. The abdominal pain radiates to the RLQ. Associated symptoms include diarrhea and vomiting. Pertinent negatives include no constipation or fever. Nothing aggravates the pain. She has tried nothing for the symptoms. The treatment provided no relief.   Patient needs and prescriptions printed out today. Her medications were reviewed today. Patient has some abdominal pain that has been present for about 1 week. Patient states that at times she cannot urinate. She would like to talk to the doctor concerning these issues.    Review of Systems  Constitutional: Negative for fever and fatigue.  HENT: Negative for congestion.   Gastrointestinal: Positive for vomiting, abdominal pain and diarrhea. Negative for constipation.       Objective:   Physical Exam  Constitutional: She appears well-nourished. No distress.  Cardiovascular: Normal rate, regular rhythm and normal heart sounds.   No murmur heard. Pulmonary/Chest: Effort normal and breath sounds normal. No respiratory distress.  Musculoskeletal: She exhibits no edema.  Lymphadenopathy:    She has no cervical adenopathy.  Neurological:  She is alert. She exhibits normal muscle tone.  Psychiatric: Her behavior is normal.  Vitals reviewed.  Abdomen soft no guarding rebound or tenderness 25 minutes spent with the patient discussing multiple issues      Assessment & Plan:  1. Type 2 diabetes mellitus without complication Her C9O looks outstanding. I told the patient she needs to stick with the current dose of glipizide. I told her if she starts having low sugar spells she needs to stop the glipizide. She is very adamant that her morning sugars are in the 90-120 range and later in the day and run as high as 1 5160. She does not one to stop glipizide currently. - POCT glycosylated hemoglobin (Hb A1C)  2. Urinary problem Urinalysis looks negative I find no evidence of urinary tract infection - POCT urinalysis dipstick  3. Essential hypertension The patient does have some slight orthostatic hypotension blood pressure good when she is sitting but it drops when she stands, stop amlodipine. We will recheck a sitting and standing blood pressure on her follow-up office visit in one month. Also decrease losartan from 100 mg to 50 mg.  4. Hyperlipemia Patient does good job watching her diet continue pravastatin  5. Orthostatic hypotension See discussion above  6. Diarrhea Has had some diarrhea recently happened the past few days but seemed to be better currently more than likely viral illness bloody stools or worse follow-up  7. Pernicious anemia B12 shot today follow-up 1 month - cyanocobalamin ((VITAMIN B-12)) injection 1,000 mcg; Inject 1 mL (1,000 mcg total) into the muscle once.

## 2015-02-26 NOTE — Patient Instructions (Signed)
If any low sugar spells ( fasting under 90 ) then stop glipizide  Your A1C was under 4 - very good  Your BP is 106/60 sitting and 94/60 when you stand

## 2015-03-25 ENCOUNTER — Other Ambulatory Visit: Payer: Self-pay | Admitting: Family Medicine

## 2015-03-29 ENCOUNTER — Ambulatory Visit (INDEPENDENT_AMBULATORY_CARE_PROVIDER_SITE_OTHER): Payer: Medicare Other | Admitting: Family Medicine

## 2015-03-29 VITALS — BP 130/76 | Ht 65.0 in | Wt 189.2 lb

## 2015-03-29 DIAGNOSIS — Z23 Encounter for immunization: Secondary | ICD-10-CM | POA: Diagnosis not present

## 2015-03-29 DIAGNOSIS — D510 Vitamin B12 deficiency anemia due to intrinsic factor deficiency: Secondary | ICD-10-CM

## 2015-03-29 MED ORDER — TRAZODONE HCL 50 MG PO TABS: 25.0000 mg | ORAL_TABLET | Freq: Every evening | ORAL | Status: DC | PRN

## 2015-03-29 MED ORDER — CYANOCOBALAMIN 1000 MCG/ML IJ SOLN
1000.0000 ug | Freq: Once | INTRAMUSCULAR | Status: AC
  Administered 2015-03-29: 1000 ug via INTRAMUSCULAR

## 2015-03-29 NOTE — Patient Instructions (Signed)
Try 1/2 trazadone for rest at night may take alpraxalam as needed for sleep as well  If the trazadone doesn't help or problems with the medicine please stop it

## 2015-03-29 NOTE — Progress Notes (Signed)
   Subjective:    Patient ID: Meghan Duran, female    DOB: January 14, 1924, 81 y.o.   MRN: [id number]  Hypertension This is a chronic problem. The current episode started more than 1 year ago. There are no compliance problems.    Patient in today for a one month follow up for hypertension. Patient also would like to discuss pneumonia vaccination and flu shot. Patient very pleasant today. Needs flu vaccine and her B12 shot Taking the medicines as directed Denies any falls denies any recent setbacks Patient at times with difficulty sleeping, I discussed with the patient how it is not in her best interest to increase Xanax at this point we will try trazodone to help her with her sleep if she does not tolerate that she will let us know.  Review of Systems Denies chest tightness shortness of breath swelling nausea vomiting diarrhea    Objective:   Physical Exam Lungs are clear hearts regular pulse normal BP good extremities no edema skin warm dry       Assessment & Plan:  HTN good control continue current measures No low sugar spells not due for A1c currently B12 today Flu vaccine today Patient will follow-up for comprehensive visit before going back to [place]

## 2015-04-02 ENCOUNTER — Other Ambulatory Visit: Payer: Self-pay | Admitting: Family Medicine

## 2015-04-02 NOTE — Telephone Encounter (Signed)
Rx faxed to [facility] in [place].

## 2015-04-02 NOTE — Telephone Encounter (Signed)
Patient needs refill on test strips called in to [facility].

## 2015-04-09 ENCOUNTER — Ambulatory Visit: Payer: Medicare Other | Admitting: Family Medicine

## 2015-04-26 ENCOUNTER — Encounter: Payer: Self-pay | Admitting: Family Medicine

## 2015-04-26 ENCOUNTER — Ambulatory Visit (INDEPENDENT_AMBULATORY_CARE_PROVIDER_SITE_OTHER): Payer: Medicare Other | Admitting: Family Medicine

## 2015-04-26 VITALS — BP 124/62 | Ht 65.0 in | Wt 189.5 lb

## 2015-04-26 DIAGNOSIS — N289 Disorder of kidney and ureter, unspecified: Secondary | ICD-10-CM

## 2015-04-26 DIAGNOSIS — D510 Vitamin B12 deficiency anemia due to intrinsic factor deficiency: Secondary | ICD-10-CM | POA: Diagnosis not present

## 2015-04-26 DIAGNOSIS — I10 Essential (primary) hypertension: Secondary | ICD-10-CM | POA: Diagnosis not present

## 2015-04-26 DIAGNOSIS — Z79899 Other long term (current) drug therapy: Secondary | ICD-10-CM | POA: Diagnosis not present

## 2015-04-26 DIAGNOSIS — R5383 Other fatigue: Secondary | ICD-10-CM

## 2015-04-26 DIAGNOSIS — D6489 Other specified anemias: Secondary | ICD-10-CM | POA: Diagnosis not present

## 2015-04-26 MED ORDER — CYANOCOBALAMIN 1000 MCG/ML IJ SOLN
1000.0000 ug | Freq: Once | INTRAMUSCULAR | Status: AC
  Administered 2015-04-26: 1000 ug via INTRAMUSCULAR

## 2015-04-26 NOTE — Progress Notes (Signed)
   Subjective:    Patient ID: Meghan Duran, female    DOB: 1924/03/09, 81 y.o.   MRN: [id number]  HPI  Patient in today with c/o of low energy,right knee pain  and to receive B12 injection.  Patient states she is been feeling very fatigued and tired over the past few weeks. Her appetites been okay. She states that she gets up to walk and she finds herself getting very tired where she has to sit back down she denies any chest pressure tightness denies wheezing denies difficulty breathing states it's more of lack of energy as well as significant fatigue. She denies any night sweats no chest pressure no cough no abdominal pain denies vomiting diarrhea denies black stools. She does relate right knee pain which is been off and on for the past several weeks she also has underlying B12 deficiency for which she gets monthly shots she also has diabetes which is been under extremely good control recently.  Review of Systems  Constitutional: Negative for activity change, appetite change and fatigue.  HENT: Negative for congestion.   Respiratory: Negative for cough.   Cardiovascular: Negative for chest pain.  Gastrointestinal: Negative for abdominal pain.  Endocrine: Negative for polydipsia and polyphagia.  Neurological: Negative for weakness.  Psychiatric/Behavioral: Negative for confusion.   Currently she does denies any high fever chills or sweats denies swelling in the legs she does relate right knee pain.    Objective:   Physical Exam  Constitutional: She appears well-nourished. No distress.  Cardiovascular: Normal rate, regular rhythm and normal heart sounds.   No murmur heard. Pulmonary/Chest: Effort normal and breath sounds normal. No respiratory distress.  Musculoskeletal: She exhibits no edema.  Lymphadenopathy:    She has no cervical adenopathy.  Neurological: She is alert. She exhibits normal muscle tone.  Psychiatric: Her behavior is normal.  Vitals reviewed.  Patient denies low  sugar spells.  25 minutes was spent with the patient. Greater than half the time was spent in discussion and answering questions and counseling regarding the issues that the patient came in for today.      Assessment & Plan:  Pernicious anemia check B12 level, B12 given today Significant fatigue tiredness present over the past couple weeks denies sleeping troubles high fevers concerning for the possibility of other underlying conditions. Due to medication she is on plus also significant fatigue and history of anemia will check CBC as well as liver function and TSH Renal insufficiency continue healthy diet and reasonable fluid intake check metabolic 7 Patient will be moving back to [facility] we will provide copies of recent labs and office visits.  May consider cutting back on metformin. Patient has been resistant to do this in the past because her sugar will go up. Await the results of her labs she is to do the labs today.

## 2015-04-27 ENCOUNTER — Other Ambulatory Visit: Payer: Self-pay | Admitting: Family Medicine

## 2015-04-27 DIAGNOSIS — M25561 Pain in right knee: Secondary | ICD-10-CM | POA: Diagnosis not present

## 2015-04-27 DIAGNOSIS — M170 Bilateral primary osteoarthritis of knee: Secondary | ICD-10-CM | POA: Diagnosis not present

## 2015-04-27 LAB — HEPATIC FUNCTION PANEL
ALBUMIN: 4.3 g/dL (ref 3.5–4.7)
ALK PHOS: 51 IU/L (ref 39–117)
ALT: 12 IU/L (ref 0–32)
AST: 14 IU/L (ref 0–40)
Bilirubin Total: 0.4 mg/dL (ref 0.0–1.2)
Bilirubin, Direct: 0.12 mg/dL (ref 0.00–0.40)
Total Protein: 6.3 g/dL (ref 6.0–8.5)

## 2015-04-27 LAB — VITAMIN B12

## 2015-04-27 LAB — BASIC METABOLIC PANEL
BUN / CREAT RATIO: 15 (ref 11–26)
BUN: 20 mg/dL (ref 8–27)
CO2: 21 mmol/L (ref 18–29)
CREATININE: 1.3 mg/dL — AB (ref 0.57–1.00)
Calcium: 9.3 mg/dL (ref 8.7–10.3)
Chloride: 105 mmol/L (ref 97–106)
GFR, EST AFRICAN AMERICAN: 42 mL/min/{1.73_m2} — AB (ref >59)
GFR, EST NON AFRICAN AMERICAN: 36 mL/min/{1.73_m2} — AB (ref >59)
Glucose: 117 mg/dL — ABNORMAL HIGH (ref 65–99)
POTASSIUM: 4.4 mmol/L (ref 3.5–5.2)
SODIUM: 145 mmol/L — AB (ref 136–144)

## 2015-04-27 LAB — CBC WITH DIFFERENTIAL/PLATELET
BASOS ABS: 0.1 10*3/uL (ref 0.0–0.2)
Basos: 1 %
EOS (ABSOLUTE): 0.8 10*3/uL — ABNORMAL HIGH (ref 0.0–0.4)
EOS: 11 %
HEMATOCRIT: 34.9 % (ref 34.0–46.6)
Hemoglobin: 11.5 g/dL (ref 11.1–15.9)
Immature Grans (Abs): 0 10*3/uL (ref 0.0–0.1)
Immature Granulocytes: 0 %
LYMPHS ABS: 1.6 10*3/uL (ref 0.7–3.1)
Lymphs: 22 %
MCH: 29.3 pg (ref 26.6–33.0)
MCHC: 33 g/dL (ref 31.5–35.7)
MCV: 89 fL (ref 79–97)
MONOS ABS: 0.7 10*3/uL (ref 0.1–0.9)
Monocytes: 10 %
NEUTROS ABS: 4.1 10*3/uL (ref 1.4–7.0)
Neutrophils: 56 %
Platelets: 187 10*3/uL (ref 150–379)
RBC: 3.93 x10E6/uL (ref 3.77–5.28)
RDW: 13.9 % (ref 12.3–15.4)
WBC: 7.3 10*3/uL (ref 3.4–10.8)

## 2015-04-27 LAB — TSH: TSH: 1.85 u[IU]/mL (ref 0.450–4.500)

## 2015-04-27 NOTE — Telephone Encounter (Signed)
May refill all this and for additional refills

## 2015-04-28 ENCOUNTER — Ambulatory Visit (INDEPENDENT_AMBULATORY_CARE_PROVIDER_SITE_OTHER): Payer: Medicare Other | Admitting: Family Medicine

## 2015-04-28 ENCOUNTER — Other Ambulatory Visit: Payer: Self-pay | Admitting: Family Medicine

## 2015-04-28 ENCOUNTER — Encounter: Payer: Self-pay | Admitting: Family Medicine

## 2015-04-28 VITALS — BP 136/74 | Temp 97.8°F | Ht 65.0 in | Wt 189.5 lb

## 2015-04-28 DIAGNOSIS — N3000 Acute cystitis without hematuria: Secondary | ICD-10-CM

## 2015-04-28 DIAGNOSIS — R309 Painful micturition, unspecified: Secondary | ICD-10-CM

## 2015-04-28 LAB — POCT URINALYSIS DIPSTICK
PH UA: 5
RBC UA: NEGATIVE
Spec Grav, UA: 1.025

## 2015-04-28 MED ORDER — SULFAMETHOXAZOLE-TRIMETHOPRIM 800-160 MG PO TABS: 1.0000 | ORAL_TABLET | Freq: Two times a day (BID) | ORAL | Status: DC

## 2015-04-28 MED ORDER — METFORMIN HCL 500 MG PO TABS: 500.0000 mg | ORAL_TABLET | Freq: Two times a day (BID) | ORAL | Status: AC

## 2015-04-28 NOTE — Telephone Encounter (Signed)
I believe this is a duplicate if not may have this and 4 refills

## 2015-04-28 NOTE — Progress Notes (Signed)
   Subjective:    Patient ID: Meghan Duran, female    DOB: [birth year], 81 y.o.   MRN: [id number]  Urinary Tract Infection  This is a new problem. The current episode started in the past 7 days. The problem occurs every urination. Associated symptoms include frequency. Associated symptoms comments: Low back pain.. She has tried nothing for the symptoms.   Patient c/o of eye lids dropping, and cough. PMH benign.  Review of Systems  Genitourinary: Positive for frequency.   dysuria. No fever or chills. Slight cough.     Objective:   Physical Exam On physical exam today there is slight puffiness above the eyes little bit conjunctivitis noted but not severe more than likely viral lungs are clear no crackles heart is regular low back subjective tenderness  Culture urine.     Assessment & Plan:  UTI-Bactrim DS twice a day 7 days Recent lab work looks good but I recommend reducing the metformin. The patient has always wanted to keep her A1c as low as possible. I have tried to convince her that this increases her risk of other health issues. Reduced metformin to one twice daily. She will hold off on glipizide during travel. She will follow-up with her doctor in [place]. Patient was advised of her A1c stays low to back down on other medicines as well. She will follow-up here in the springtime.

## 2015-04-30 LAB — URINE CULTURE: Organism ID, Bacteria: NO GROWTH

## 2015-05-21 ENCOUNTER — Other Ambulatory Visit: Payer: Self-pay | Admitting: Family Medicine

## 2015-05-31 DIAGNOSIS — G4762 Sleep related leg cramps: Secondary | ICD-10-CM | POA: Diagnosis not present

## 2015-05-31 DIAGNOSIS — I10 Essential (primary) hypertension: Secondary | ICD-10-CM | POA: Diagnosis not present

## 2015-05-31 DIAGNOSIS — Z6830 Body mass index (BMI) 30.0-30.9, adult: Secondary | ICD-10-CM | POA: Diagnosis not present

## 2015-05-31 DIAGNOSIS — N183 Chronic kidney disease, stage 3 (moderate): Secondary | ICD-10-CM | POA: Diagnosis not present

## 2015-05-31 DIAGNOSIS — E1122 Type 2 diabetes mellitus with diabetic chronic kidney disease: Secondary | ICD-10-CM | POA: Diagnosis not present

## 2015-05-31 DIAGNOSIS — Z7189 Other specified counseling: Secondary | ICD-10-CM | POA: Diagnosis not present

## 2015-05-31 DIAGNOSIS — H6123 Impacted cerumen, bilateral: Secondary | ICD-10-CM | POA: Diagnosis not present

## 2015-05-31 DIAGNOSIS — D519 Vitamin B12 deficiency anemia, unspecified: Secondary | ICD-10-CM | POA: Diagnosis not present

## 2015-06-15 DIAGNOSIS — L84 Corns and callosities: Secondary | ICD-10-CM | POA: Diagnosis not present

## 2015-06-15 DIAGNOSIS — Z85820 Personal history of malignant melanoma of skin: Secondary | ICD-10-CM | POA: Diagnosis not present

## 2015-06-15 DIAGNOSIS — L570 Actinic keratosis: Secondary | ICD-10-CM | POA: Diagnosis not present

## 2015-06-15 DIAGNOSIS — Z08 Encounter for follow-up examination after completed treatment for malignant neoplasm: Secondary | ICD-10-CM | POA: Diagnosis not present

## 2015-06-15 DIAGNOSIS — Z85828 Personal history of other malignant neoplasm of skin: Secondary | ICD-10-CM | POA: Diagnosis not present

## 2015-06-17 DIAGNOSIS — M2041 Other hammer toe(s) (acquired), right foot: Secondary | ICD-10-CM | POA: Diagnosis not present

## 2015-06-17 DIAGNOSIS — M2042 Other hammer toe(s) (acquired), left foot: Secondary | ICD-10-CM | POA: Diagnosis not present

## 2015-06-17 DIAGNOSIS — B351 Tinea unguium: Secondary | ICD-10-CM | POA: Diagnosis not present

## 2015-06-17 DIAGNOSIS — E1151 Type 2 diabetes mellitus with diabetic peripheral angiopathy without gangrene: Secondary | ICD-10-CM | POA: Diagnosis not present

## 2015-06-17 DIAGNOSIS — L97522 Non-pressure chronic ulcer of other part of left foot with fat layer exposed: Secondary | ICD-10-CM | POA: Diagnosis not present

## 2015-06-17 DIAGNOSIS — I7091 Generalized atherosclerosis: Secondary | ICD-10-CM | POA: Diagnosis not present

## 2015-06-25 ENCOUNTER — Other Ambulatory Visit: Payer: Self-pay | Admitting: Family Medicine

## 2015-06-29 DIAGNOSIS — E538 Deficiency of other specified B group vitamins: Secondary | ICD-10-CM | POA: Diagnosis not present

## 2015-07-06 DIAGNOSIS — B351 Tinea unguium: Secondary | ICD-10-CM | POA: Diagnosis not present

## 2015-07-06 DIAGNOSIS — M2042 Other hammer toe(s) (acquired), left foot: Secondary | ICD-10-CM | POA: Diagnosis not present

## 2015-07-06 DIAGNOSIS — I7091 Generalized atherosclerosis: Secondary | ICD-10-CM | POA: Diagnosis not present

## 2015-07-06 DIAGNOSIS — M2041 Other hammer toe(s) (acquired), right foot: Secondary | ICD-10-CM | POA: Diagnosis not present

## 2015-07-06 DIAGNOSIS — E1151 Type 2 diabetes mellitus with diabetic peripheral angiopathy without gangrene: Secondary | ICD-10-CM | POA: Diagnosis not present

## 2015-07-09 DIAGNOSIS — Z7189 Other specified counseling: Secondary | ICD-10-CM | POA: Diagnosis not present

## 2015-07-09 DIAGNOSIS — E1142 Type 2 diabetes mellitus with diabetic polyneuropathy: Secondary | ICD-10-CM | POA: Diagnosis not present

## 2015-07-09 DIAGNOSIS — K5792 Diverticulitis of intestine, part unspecified, without perforation or abscess without bleeding: Secondary | ICD-10-CM | POA: Diagnosis not present

## 2015-07-09 DIAGNOSIS — Z713 Dietary counseling and surveillance: Secondary | ICD-10-CM | POA: Diagnosis not present

## 2015-07-09 DIAGNOSIS — Z6830 Body mass index (BMI) 30.0-30.9, adult: Secondary | ICD-10-CM | POA: Diagnosis not present

## 2015-07-27 ENCOUNTER — Other Ambulatory Visit: Payer: Self-pay | Admitting: Family Medicine

## 2015-07-27 DIAGNOSIS — E538 Deficiency of other specified B group vitamins: Secondary | ICD-10-CM | POA: Diagnosis not present

## 2015-08-03 DIAGNOSIS — M654 Radial styloid tenosynovitis [de Quervain]: Secondary | ICD-10-CM | POA: Diagnosis not present

## 2015-08-27 DIAGNOSIS — E538 Deficiency of other specified B group vitamins: Secondary | ICD-10-CM | POA: Diagnosis not present

## 2015-08-27 DIAGNOSIS — H40011 Open angle with borderline findings, low risk, right eye: Secondary | ICD-10-CM | POA: Diagnosis not present

## 2015-08-27 DIAGNOSIS — E119 Type 2 diabetes mellitus without complications: Secondary | ICD-10-CM | POA: Diagnosis not present

## 2015-08-27 DIAGNOSIS — H35363 Drusen (degenerative) of macula, bilateral: Secondary | ICD-10-CM | POA: Diagnosis not present

## 2015-08-27 DIAGNOSIS — H43813 Vitreous degeneration, bilateral: Secondary | ICD-10-CM | POA: Diagnosis not present

## 2015-09-16 DIAGNOSIS — R300 Dysuria: Secondary | ICD-10-CM | POA: Diagnosis not present

## 2015-09-28 DIAGNOSIS — D519 Vitamin B12 deficiency anemia, unspecified: Secondary | ICD-10-CM | POA: Diagnosis not present

## 2015-10-19 DIAGNOSIS — E1142 Type 2 diabetes mellitus with diabetic polyneuropathy: Secondary | ICD-10-CM | POA: Diagnosis not present

## 2015-10-19 DIAGNOSIS — E785 Hyperlipidemia, unspecified: Secondary | ICD-10-CM | POA: Diagnosis not present

## 2015-10-26 DIAGNOSIS — M25561 Pain in right knee: Secondary | ICD-10-CM | POA: Diagnosis not present

## 2015-10-26 DIAGNOSIS — Z713 Dietary counseling and surveillance: Secondary | ICD-10-CM | POA: Diagnosis not present

## 2015-10-26 DIAGNOSIS — D519 Vitamin B12 deficiency anemia, unspecified: Secondary | ICD-10-CM | POA: Diagnosis not present

## 2015-10-26 DIAGNOSIS — Z6829 Body mass index (BMI) 29.0-29.9, adult: Secondary | ICD-10-CM | POA: Diagnosis not present

## 2015-10-26 DIAGNOSIS — E1151 Type 2 diabetes mellitus with diabetic peripheral angiopathy without gangrene: Secondary | ICD-10-CM | POA: Diagnosis not present

## 2015-10-26 DIAGNOSIS — Z7189 Other specified counseling: Secondary | ICD-10-CM | POA: Diagnosis not present

## 2015-10-26 DIAGNOSIS — D649 Anemia, unspecified: Secondary | ICD-10-CM | POA: Diagnosis not present

## 2015-11-23 DIAGNOSIS — D1801 Hemangioma of skin and subcutaneous tissue: Secondary | ICD-10-CM | POA: Diagnosis not present

## 2015-11-23 DIAGNOSIS — D485 Neoplasm of uncertain behavior of skin: Secondary | ICD-10-CM | POA: Diagnosis not present

## 2015-11-23 DIAGNOSIS — L821 Other seborrheic keratosis: Secondary | ICD-10-CM | POA: Diagnosis not present

## 2015-11-23 DIAGNOSIS — L570 Actinic keratosis: Secondary | ICD-10-CM | POA: Diagnosis not present

## 2015-11-23 DIAGNOSIS — C44729 Squamous cell carcinoma of skin of left lower limb, including hip: Secondary | ICD-10-CM | POA: Diagnosis not present

## 2015-11-23 DIAGNOSIS — Z85820 Personal history of malignant melanoma of skin: Secondary | ICD-10-CM | POA: Diagnosis not present

## 2015-11-23 DIAGNOSIS — D0461 Carcinoma in situ of skin of right upper limb, including shoulder: Secondary | ICD-10-CM | POA: Diagnosis not present

## 2015-11-23 DIAGNOSIS — E538 Deficiency of other specified B group vitamins: Secondary | ICD-10-CM | POA: Diagnosis not present

## 2015-11-23 DIAGNOSIS — Z08 Encounter for follow-up examination after completed treatment for malignant neoplasm: Secondary | ICD-10-CM | POA: Diagnosis not present

## 2015-11-25 DIAGNOSIS — Z713 Dietary counseling and surveillance: Secondary | ICD-10-CM | POA: Diagnosis not present

## 2015-11-25 DIAGNOSIS — J209 Acute bronchitis, unspecified: Secondary | ICD-10-CM | POA: Diagnosis not present

## 2015-11-25 DIAGNOSIS — Z6828 Body mass index (BMI) 28.0-28.9, adult: Secondary | ICD-10-CM | POA: Diagnosis not present

## 2015-11-25 DIAGNOSIS — R221 Localized swelling, mass and lump, neck: Secondary | ICD-10-CM | POA: Diagnosis not present

## 2015-11-25 DIAGNOSIS — Z7189 Other specified counseling: Secondary | ICD-10-CM | POA: Diagnosis not present

## 2015-11-25 DIAGNOSIS — D519 Vitamin B12 deficiency anemia, unspecified: Secondary | ICD-10-CM | POA: Diagnosis not present

## 2015-12-08 DIAGNOSIS — Z510 Encounter for antineoplastic radiation therapy: Secondary | ICD-10-CM | POA: Diagnosis not present

## 2015-12-08 DIAGNOSIS — C8590 Non-Hodgkin lymphoma, unspecified, unspecified site: Secondary | ICD-10-CM | POA: Diagnosis not present

## 2015-12-14 DIAGNOSIS — D519 Vitamin B12 deficiency anemia, unspecified: Secondary | ICD-10-CM | POA: Diagnosis not present

## 2015-12-14 DIAGNOSIS — R300 Dysuria: Secondary | ICD-10-CM | POA: Diagnosis not present

## 2015-12-21 DIAGNOSIS — M170 Bilateral primary osteoarthritis of knee: Secondary | ICD-10-CM | POA: Diagnosis not present

## 2015-12-21 DIAGNOSIS — M25561 Pain in right knee: Secondary | ICD-10-CM | POA: Diagnosis not present

## 2015-12-21 DIAGNOSIS — M25562 Pain in left knee: Secondary | ICD-10-CM | POA: Diagnosis not present

## 2016-01-10 DIAGNOSIS — D0461 Carcinoma in situ of skin of right upper limb, including shoulder: Secondary | ICD-10-CM | POA: Diagnosis not present

## 2016-01-10 DIAGNOSIS — C44729 Squamous cell carcinoma of skin of left lower limb, including hip: Secondary | ICD-10-CM | POA: Diagnosis not present

## 2016-01-14 DIAGNOSIS — D519 Vitamin B12 deficiency anemia, unspecified: Secondary | ICD-10-CM | POA: Diagnosis not present

## 2016-01-17 DIAGNOSIS — R221 Localized swelling, mass and lump, neck: Secondary | ICD-10-CM | POA: Diagnosis not present

## 2016-02-11 DIAGNOSIS — E1151 Type 2 diabetes mellitus with diabetic peripheral angiopathy without gangrene: Secondary | ICD-10-CM | POA: Diagnosis not present

## 2016-02-11 DIAGNOSIS — E785 Hyperlipidemia, unspecified: Secondary | ICD-10-CM | POA: Diagnosis not present

## 2016-02-16 DIAGNOSIS — E1122 Type 2 diabetes mellitus with diabetic chronic kidney disease: Secondary | ICD-10-CM | POA: Diagnosis not present

## 2016-02-16 DIAGNOSIS — Z7984 Long term (current) use of oral hypoglycemic drugs: Secondary | ICD-10-CM | POA: Diagnosis not present

## 2016-02-16 DIAGNOSIS — E1142 Type 2 diabetes mellitus with diabetic polyneuropathy: Secondary | ICD-10-CM | POA: Diagnosis not present

## 2016-02-16 DIAGNOSIS — D510 Vitamin B12 deficiency anemia due to intrinsic factor deficiency: Secondary | ICD-10-CM | POA: Diagnosis not present

## 2016-02-16 DIAGNOSIS — F339 Major depressive disorder, recurrent, unspecified: Secondary | ICD-10-CM | POA: Diagnosis not present

## 2016-02-16 DIAGNOSIS — L570 Actinic keratosis: Secondary | ICD-10-CM | POA: Diagnosis not present

## 2016-02-16 DIAGNOSIS — R262 Difficulty in walking, not elsewhere classified: Secondary | ICD-10-CM | POA: Diagnosis not present

## 2016-02-16 DIAGNOSIS — M6281 Muscle weakness (generalized): Secondary | ICD-10-CM | POA: Diagnosis not present

## 2016-02-16 DIAGNOSIS — L821 Other seborrheic keratosis: Secondary | ICD-10-CM | POA: Diagnosis not present

## 2016-02-16 DIAGNOSIS — N183 Chronic kidney disease, stage 3 (moderate): Secondary | ICD-10-CM | POA: Diagnosis not present

## 2016-02-16 DIAGNOSIS — Z791 Long term (current) use of non-steroidal anti-inflammatories (NSAID): Secondary | ICD-10-CM | POA: Diagnosis not present

## 2016-02-16 DIAGNOSIS — Z9181 History of falling: Secondary | ICD-10-CM | POA: Diagnosis not present

## 2016-02-16 DIAGNOSIS — F419 Anxiety disorder, unspecified: Secondary | ICD-10-CM | POA: Diagnosis not present

## 2016-02-16 DIAGNOSIS — I6529 Occlusion and stenosis of unspecified carotid artery: Secondary | ICD-10-CM | POA: Diagnosis not present

## 2016-02-16 DIAGNOSIS — L814 Other melanin hyperpigmentation: Secondary | ICD-10-CM | POA: Diagnosis not present

## 2016-02-16 DIAGNOSIS — R239 Unspecified skin changes: Secondary | ICD-10-CM | POA: Diagnosis not present

## 2016-02-16 DIAGNOSIS — I129 Hypertensive chronic kidney disease with stage 1 through stage 4 chronic kidney disease, or unspecified chronic kidney disease: Secondary | ICD-10-CM | POA: Diagnosis not present

## 2016-02-16 DIAGNOSIS — D1801 Hemangioma of skin and subcutaneous tissue: Secondary | ICD-10-CM | POA: Diagnosis not present

## 2016-02-16 DIAGNOSIS — Z85820 Personal history of malignant melanoma of skin: Secondary | ICD-10-CM | POA: Diagnosis not present

## 2016-02-17 DIAGNOSIS — M6281 Muscle weakness (generalized): Secondary | ICD-10-CM | POA: Diagnosis not present

## 2016-02-17 DIAGNOSIS — I129 Hypertensive chronic kidney disease with stage 1 through stage 4 chronic kidney disease, or unspecified chronic kidney disease: Secondary | ICD-10-CM | POA: Diagnosis not present

## 2016-02-17 DIAGNOSIS — R262 Difficulty in walking, not elsewhere classified: Secondary | ICD-10-CM | POA: Diagnosis not present

## 2016-02-17 DIAGNOSIS — F419 Anxiety disorder, unspecified: Secondary | ICD-10-CM | POA: Diagnosis not present

## 2016-02-17 DIAGNOSIS — R239 Unspecified skin changes: Secondary | ICD-10-CM | POA: Diagnosis not present

## 2016-02-17 DIAGNOSIS — E1122 Type 2 diabetes mellitus with diabetic chronic kidney disease: Secondary | ICD-10-CM | POA: Diagnosis not present

## 2016-02-18 DIAGNOSIS — F419 Anxiety disorder, unspecified: Secondary | ICD-10-CM | POA: Diagnosis not present

## 2016-02-18 DIAGNOSIS — R239 Unspecified skin changes: Secondary | ICD-10-CM | POA: Diagnosis not present

## 2016-02-18 DIAGNOSIS — E1122 Type 2 diabetes mellitus with diabetic chronic kidney disease: Secondary | ICD-10-CM | POA: Diagnosis not present

## 2016-02-18 DIAGNOSIS — R262 Difficulty in walking, not elsewhere classified: Secondary | ICD-10-CM | POA: Diagnosis not present

## 2016-02-18 DIAGNOSIS — I129 Hypertensive chronic kidney disease with stage 1 through stage 4 chronic kidney disease, or unspecified chronic kidney disease: Secondary | ICD-10-CM | POA: Diagnosis not present

## 2016-02-18 DIAGNOSIS — M6281 Muscle weakness (generalized): Secondary | ICD-10-CM | POA: Diagnosis not present

## 2016-02-21 DIAGNOSIS — R239 Unspecified skin changes: Secondary | ICD-10-CM | POA: Diagnosis not present

## 2016-02-21 DIAGNOSIS — E1122 Type 2 diabetes mellitus with diabetic chronic kidney disease: Secondary | ICD-10-CM | POA: Diagnosis not present

## 2016-02-21 DIAGNOSIS — M6281 Muscle weakness (generalized): Secondary | ICD-10-CM | POA: Diagnosis not present

## 2016-02-21 DIAGNOSIS — F419 Anxiety disorder, unspecified: Secondary | ICD-10-CM | POA: Diagnosis not present

## 2016-02-21 DIAGNOSIS — R262 Difficulty in walking, not elsewhere classified: Secondary | ICD-10-CM | POA: Diagnosis not present

## 2016-02-21 DIAGNOSIS — I129 Hypertensive chronic kidney disease with stage 1 through stage 4 chronic kidney disease, or unspecified chronic kidney disease: Secondary | ICD-10-CM | POA: Diagnosis not present

## 2016-02-22 DIAGNOSIS — M159 Polyosteoarthritis, unspecified: Secondary | ICD-10-CM | POA: Diagnosis not present

## 2016-02-22 DIAGNOSIS — N183 Chronic kidney disease, stage 3 (moderate): Secondary | ICD-10-CM | POA: Diagnosis not present

## 2016-02-22 DIAGNOSIS — Z6828 Body mass index (BMI) 28.0-28.9, adult: Secondary | ICD-10-CM | POA: Diagnosis not present

## 2016-02-22 DIAGNOSIS — I10 Essential (primary) hypertension: Secondary | ICD-10-CM | POA: Diagnosis not present

## 2016-02-22 DIAGNOSIS — E1122 Type 2 diabetes mellitus with diabetic chronic kidney disease: Secondary | ICD-10-CM | POA: Diagnosis not present

## 2016-02-22 DIAGNOSIS — D519 Vitamin B12 deficiency anemia, unspecified: Secondary | ICD-10-CM | POA: Diagnosis not present

## 2016-02-23 DIAGNOSIS — R239 Unspecified skin changes: Secondary | ICD-10-CM | POA: Diagnosis not present

## 2016-02-23 DIAGNOSIS — F419 Anxiety disorder, unspecified: Secondary | ICD-10-CM | POA: Diagnosis not present

## 2016-02-23 DIAGNOSIS — I129 Hypertensive chronic kidney disease with stage 1 through stage 4 chronic kidney disease, or unspecified chronic kidney disease: Secondary | ICD-10-CM | POA: Diagnosis not present

## 2016-02-23 DIAGNOSIS — M6281 Muscle weakness (generalized): Secondary | ICD-10-CM | POA: Diagnosis not present

## 2016-02-23 DIAGNOSIS — E1122 Type 2 diabetes mellitus with diabetic chronic kidney disease: Secondary | ICD-10-CM | POA: Diagnosis not present

## 2016-02-23 DIAGNOSIS — R262 Difficulty in walking, not elsewhere classified: Secondary | ICD-10-CM | POA: Diagnosis not present

## 2016-02-24 DIAGNOSIS — E1122 Type 2 diabetes mellitus with diabetic chronic kidney disease: Secondary | ICD-10-CM | POA: Diagnosis not present

## 2016-02-24 DIAGNOSIS — F419 Anxiety disorder, unspecified: Secondary | ICD-10-CM | POA: Diagnosis not present

## 2016-02-24 DIAGNOSIS — M6281 Muscle weakness (generalized): Secondary | ICD-10-CM | POA: Diagnosis not present

## 2016-02-24 DIAGNOSIS — I129 Hypertensive chronic kidney disease with stage 1 through stage 4 chronic kidney disease, or unspecified chronic kidney disease: Secondary | ICD-10-CM | POA: Diagnosis not present

## 2016-02-24 DIAGNOSIS — R262 Difficulty in walking, not elsewhere classified: Secondary | ICD-10-CM | POA: Diagnosis not present

## 2016-02-24 DIAGNOSIS — R239 Unspecified skin changes: Secondary | ICD-10-CM | POA: Diagnosis not present

## 2016-02-29 DIAGNOSIS — R262 Difficulty in walking, not elsewhere classified: Secondary | ICD-10-CM | POA: Diagnosis not present

## 2016-02-29 DIAGNOSIS — I129 Hypertensive chronic kidney disease with stage 1 through stage 4 chronic kidney disease, or unspecified chronic kidney disease: Secondary | ICD-10-CM | POA: Diagnosis not present

## 2016-02-29 DIAGNOSIS — R239 Unspecified skin changes: Secondary | ICD-10-CM | POA: Diagnosis not present

## 2016-02-29 DIAGNOSIS — M6281 Muscle weakness (generalized): Secondary | ICD-10-CM | POA: Diagnosis not present

## 2016-02-29 DIAGNOSIS — E1122 Type 2 diabetes mellitus with diabetic chronic kidney disease: Secondary | ICD-10-CM | POA: Diagnosis not present

## 2016-02-29 DIAGNOSIS — F419 Anxiety disorder, unspecified: Secondary | ICD-10-CM | POA: Diagnosis not present

## 2016-03-02 DIAGNOSIS — R262 Difficulty in walking, not elsewhere classified: Secondary | ICD-10-CM | POA: Diagnosis not present

## 2016-03-02 DIAGNOSIS — I7091 Generalized atherosclerosis: Secondary | ICD-10-CM | POA: Diagnosis not present

## 2016-03-02 DIAGNOSIS — R239 Unspecified skin changes: Secondary | ICD-10-CM | POA: Diagnosis not present

## 2016-03-02 DIAGNOSIS — M65872 Other synovitis and tenosynovitis, left ankle and foot: Secondary | ICD-10-CM | POA: Diagnosis not present

## 2016-03-02 DIAGNOSIS — B351 Tinea unguium: Secondary | ICD-10-CM | POA: Diagnosis not present

## 2016-03-02 DIAGNOSIS — E1151 Type 2 diabetes mellitus with diabetic peripheral angiopathy without gangrene: Secondary | ICD-10-CM | POA: Diagnosis not present

## 2016-03-02 DIAGNOSIS — M6281 Muscle weakness (generalized): Secondary | ICD-10-CM | POA: Diagnosis not present

## 2016-03-02 DIAGNOSIS — I129 Hypertensive chronic kidney disease with stage 1 through stage 4 chronic kidney disease, or unspecified chronic kidney disease: Secondary | ICD-10-CM | POA: Diagnosis not present

## 2016-03-02 DIAGNOSIS — E1122 Type 2 diabetes mellitus with diabetic chronic kidney disease: Secondary | ICD-10-CM | POA: Diagnosis not present

## 2016-03-02 DIAGNOSIS — F419 Anxiety disorder, unspecified: Secondary | ICD-10-CM | POA: Diagnosis not present

## 2016-03-03 DIAGNOSIS — R262 Difficulty in walking, not elsewhere classified: Secondary | ICD-10-CM | POA: Diagnosis not present

## 2016-03-03 DIAGNOSIS — E1122 Type 2 diabetes mellitus with diabetic chronic kidney disease: Secondary | ICD-10-CM | POA: Diagnosis not present

## 2016-03-03 DIAGNOSIS — F419 Anxiety disorder, unspecified: Secondary | ICD-10-CM | POA: Diagnosis not present

## 2016-03-03 DIAGNOSIS — R239 Unspecified skin changes: Secondary | ICD-10-CM | POA: Diagnosis not present

## 2016-03-03 DIAGNOSIS — N390 Urinary tract infection, site not specified: Secondary | ICD-10-CM | POA: Diagnosis not present

## 2016-03-03 DIAGNOSIS — I129 Hypertensive chronic kidney disease with stage 1 through stage 4 chronic kidney disease, or unspecified chronic kidney disease: Secondary | ICD-10-CM | POA: Diagnosis not present

## 2016-03-03 DIAGNOSIS — M6281 Muscle weakness (generalized): Secondary | ICD-10-CM | POA: Diagnosis not present

## 2016-03-07 DIAGNOSIS — I129 Hypertensive chronic kidney disease with stage 1 through stage 4 chronic kidney disease, or unspecified chronic kidney disease: Secondary | ICD-10-CM | POA: Diagnosis not present

## 2016-03-07 DIAGNOSIS — R262 Difficulty in walking, not elsewhere classified: Secondary | ICD-10-CM | POA: Diagnosis not present

## 2016-03-07 DIAGNOSIS — F419 Anxiety disorder, unspecified: Secondary | ICD-10-CM | POA: Diagnosis not present

## 2016-03-07 DIAGNOSIS — R239 Unspecified skin changes: Secondary | ICD-10-CM | POA: Diagnosis not present

## 2016-03-07 DIAGNOSIS — E1122 Type 2 diabetes mellitus with diabetic chronic kidney disease: Secondary | ICD-10-CM | POA: Diagnosis not present

## 2016-03-07 DIAGNOSIS — M6281 Muscle weakness (generalized): Secondary | ICD-10-CM | POA: Diagnosis not present

## 2016-03-11 ENCOUNTER — Emergency Department (HOSPITAL_COMMUNITY)
Admission: EM | Admit: 2016-03-11 | Discharge: 2016-03-11 | Disposition: A | Discharge status: Home / Self Care | Payer: Medicare Other | Attending: Emergency Medicine | Admitting: Emergency Medicine

## 2016-03-11 ENCOUNTER — Encounter (HOSPITAL_COMMUNITY): Payer: Self-pay | Admitting: Emergency Medicine

## 2016-03-11 ENCOUNTER — Emergency Department (HOSPITAL_COMMUNITY): Payer: Medicare Other

## 2016-03-11 DIAGNOSIS — S81811A Laceration without foreign body, right lower leg, initial encounter: Secondary | ICD-10-CM | POA: Diagnosis not present

## 2016-03-11 DIAGNOSIS — Y999 Unspecified external cause status: Secondary | ICD-10-CM | POA: Diagnosis not present

## 2016-03-11 DIAGNOSIS — Z87891 Personal history of nicotine dependence: Secondary | ICD-10-CM | POA: Insufficient documentation

## 2016-03-11 DIAGNOSIS — S299XXA Unspecified injury of thorax, initial encounter: Secondary | ICD-10-CM | POA: Diagnosis not present

## 2016-03-11 DIAGNOSIS — R0781 Pleurodynia: Secondary | ICD-10-CM | POA: Diagnosis not present

## 2016-03-11 DIAGNOSIS — S81812A Laceration without foreign body, left lower leg, initial encounter: Secondary | ICD-10-CM | POA: Insufficient documentation

## 2016-03-11 DIAGNOSIS — W01198A Fall on same level from slipping, tripping and stumbling with subsequent striking against other object, initial encounter: Secondary | ICD-10-CM | POA: Insufficient documentation

## 2016-03-11 DIAGNOSIS — Z79899 Other long term (current) drug therapy: Secondary | ICD-10-CM | POA: Diagnosis not present

## 2016-03-11 DIAGNOSIS — E119 Type 2 diabetes mellitus without complications: Secondary | ICD-10-CM | POA: Insufficient documentation

## 2016-03-11 DIAGNOSIS — I10 Essential (primary) hypertension: Secondary | ICD-10-CM | POA: Insufficient documentation

## 2016-03-11 DIAGNOSIS — S51011A Laceration without foreign body of right elbow, initial encounter: Secondary | ICD-10-CM | POA: Diagnosis not present

## 2016-03-11 DIAGNOSIS — T148XXA Other injury of unspecified body region, initial encounter: Secondary | ICD-10-CM

## 2016-03-11 DIAGNOSIS — Z7984 Long term (current) use of oral hypoglycemic drugs: Secondary | ICD-10-CM | POA: Insufficient documentation

## 2016-03-11 DIAGNOSIS — Y92009 Unspecified place in unspecified non-institutional (private) residence as the place of occurrence of the external cause: Secondary | ICD-10-CM | POA: Insufficient documentation

## 2016-03-11 DIAGNOSIS — W19XXXA Unspecified fall, initial encounter: Secondary | ICD-10-CM

## 2016-03-11 DIAGNOSIS — S59901A Unspecified injury of right elbow, initial encounter: Secondary | ICD-10-CM | POA: Diagnosis present

## 2016-03-11 DIAGNOSIS — Z7982 Long term (current) use of aspirin: Secondary | ICD-10-CM | POA: Insufficient documentation

## 2016-03-11 DIAGNOSIS — Y939 Activity, unspecified: Secondary | ICD-10-CM | POA: Diagnosis not present

## 2016-03-11 DIAGNOSIS — Z85818 Personal history of malignant neoplasm of other sites of lip, oral cavity, and pharynx: Secondary | ICD-10-CM | POA: Insufficient documentation

## 2016-03-11 NOTE — ED Triage Notes (Signed)
Reports tripping over her feet this morning getting her coffee and hit right ribs on corner of cabinet.  Skin tear to right elbow and left shin.

## 2016-03-11 NOTE — ED Provider Notes (Signed)
[facility] DEPT Provider Note   CSN: [id number] Arrival date & time: 03/11/16  1255     History   Chief Complaint Chief Complaint  Patient presents with  . Fall    HPI Meghan Duran is a 82 y.o. female.  HPI  Pt was seen at 1515. Per pt, c/o sudden onset and resolution of one episode of trip and fall that occurred this morning PTA. Pt states she "tripped over my feet" while getting her coffee, and hit her right ribs into a cabinet. Pt also states she "skinned my elbow and shins." Pt denies any other complaints. Denies prodromal symptoms before fall. Denies LOC, no AMS, no neck or back pain, no head injury, no CP/palpitations, no SOB/cough, no abd pain, no N/V/D, no focal motor weakness, no tingling/numbness in extremities.    Td UTD Past Medical History:  Diagnosis Date  . Anxiety 09/22/2013  . Cancer ([place])   . Depression 09/22/2013  . Diabetes mellitus without complication ([place])   . GERD (gastroesophageal reflux disease) 09/22/2013  . HTN (hypertension) 09/22/2013  . Hypertension   . Neuropathy ([facility])    Feet and legs  . Pernicious anemia   . Thyroid nodule   . Tonsil cancer [facility])     Patient Active Problem List   Diagnosis Date Noted  . Insomnia 03/11/2014  . TIA (transient ischemic attack) 09/22/2013  . Right sided weakness 09/22/2013  . Depression 09/22/2013  . Anxiety 09/22/2013  . GERD (gastroesophageal reflux disease) 09/22/2013  . UTI (lower urinary tract infection) 09/22/2013  . ARF (acute renal failure) ([place]) 09/22/2013  . HTN (hypertension) 09/22/2013  . Thyroid nodule 09/22/2013  . Diabetic neuropathy, type II diabetes mellitus ([place]) 02/19/2013  . Heme positive stool 02/19/2013  . Pernicious anemia 11/20/2012  . DM II (diabetes mellitus, type II), controlled ([place]) 11/20/2012  . Renal insufficiency 11/20/2012  . Abdominal pain, unspecified site 11/20/2012  . Hyperlipemia 11/20/2012    Past Surgical History:  Procedure Laterality Date  .  ABDOMINAL HYSTERECTOMY    . APPENDECTOMY    . BLADDER SURGERY    . CHOLECYSTECTOMY    . RECTAL SURGERY      OB History    No data available       Home Medications    Prior to Admission medications   Medication Sig Start Date End Date Taking? Authorizing Provider  acetaminophen (TYLENOL EX ST ARTHRITIS PAIN) 500 MG tablet Take 500 mg by mouth every 6 (six) hours as needed for mild pain or moderate pain.    Historical Provider, MD  ALPRAZolam (XANAX) 0.5 MG tablet TAKE ONE TABLET BY MOUTH AT BEDTIME AS NEEDED FOR SLEEP OR ANXIETY. (BEDTIME) 04/28/15   [name], MD  ASPIRIN LOW DOSE 81 MG EC tablet ONE TABLET BY MOUTH DAILY. (MORNING) 04/28/15   [name], MD  buPROPion (WELLBUTRIN SR) 150 MG 12 hr tablet Take 1 tablet (150 mg total) by mouth daily. 10/21/14   [name], MD  busPIRone (BUSPAR) 15 MG tablet TAKE ONE TABLET BY MOUTH TWICE DAILY (MORNING & IN THE EVENING) 03/25/15   [name], MD  cloNIDine (CATAPRES) 0.1 MG tablet TAKE ONE TABLET BY MOUTH TWICE DAILY. (MORNING AND IN THE EVENING) 04/28/15   [name], MD  fluticasone (FLONASE) 50 MCG/ACT nasal spray Place 1 spray into both nostrils daily. Patient not taking: Reported on 04/26/2015 10/12/14 10/12/15  [name], MD  furosemide (LASIX) 20 MG tablet Take 1 tablet (20  mg total) by mouth daily as needed. Patient not taking: Reported on 04/26/2015 11/27/14   [name], MD  glipiZIDE (GLUCOTROL) 5 MG tablet TAKE 1/2 TABLET BY MOUTH EVERY MORNING. 05/24/15   [name], MD  loratadine (CLARITIN) 10 MG tablet TAKE ONE TABLET BY MOUTH DAILY. (IN THE EVENING) 04/28/15   [name], MD  losartan (COZAAR) 50 MG tablet TAKE ONE TABLET BY MOUTH ONCE DAILY (MORNING) 07/27/15   [name], MD  meclizine (ANTIVERT) 12.5 MG tablet TAKE ONE TABLET BY MOUTH TWICE DAILY AS NEEDED (MORNING & IN THE EVENING) 03/25/15   [name], MD  metFORMIN (GLUCOPHAGE) 500 MG tablet Take 1 tablet (500 mg total) by  mouth 2 (two) times daily. 04/28/15   [name], MD  metoprolol succinate (TOPROL-XL) 25 MG 24 hr tablet TAKE ONE TABLET BY MOUTH IN THE EVENING 03/25/15   [name], MD  pravastatin (PRAVACHOL) 40 MG tablet TAKE ONE TABLET BY MOUTH EVERY EVENING 03/25/15   [name], MD  sulfamethoxazole-trimethoprim (BACTRIM DS,SEPTRA DS) 800-160 MG tablet Take 1 tablet by mouth 2 (two) times daily. 04/28/15   [name], MD  traZODone (DESYREL) 50 MG tablet TAKE 1/2 TO 1 TABLET BY MOUTH AT BEDTIME AS NEEDED FOR SLEEP. 06/25/15   Mikey Kirschner, MD  TRUETRACK TEST test strip TEST ONCE A DAY 04/02/15   [name], MD    Family History Family History  Problem Relation Age of Onset  . Diabetes Sister   . Heart attack Sister   . Cancer Sister     head neck     Social History Social History  Substance Use Topics  . Smoking status: Former Smoker    Quit date: 11/20/1985  . Smokeless tobacco: Not on file     Comment: Over 30 years ago  . Alcohol use Yes     Comment: occasional     Allergies   Amoxil [amoxicillin]; Cymbalta [duloxetine hcl]; Macrobid [nitrofurantoin macrocrystal]; Other; and Tramadol   Review of Systems Review of Systems ROS: Statement: All systems negative except as marked or noted in the HPI; Constitutional: Negative for fever and chills. ; ; Eyes: Negative for eye pain, redness and discharge. ; ; ENMT: Negative for ear pain, hoarseness, nasal congestion, sinus pressure and sore throat. ; ; Cardiovascular: Negative for chest pain, palpitations, diaphoresis, dyspnea and peripheral edema. ; ; Respiratory: Negative for cough, wheezing and stridor. ; ; Gastrointestinal: Negative for nausea, vomiting, diarrhea, abdominal pain, blood in stool, hematemesis, jaundice and rectal bleeding. . ; ; Genitourinary: Negative for dysuria, flank pain and hematuria. ; ; Musculoskeletal: +right ribs pain. Negative for back pain and neck pain. Negative for swelling and deformity.; ;  Skin: +skin tears. Negative for pruritus, rash, abrasions, blisters, bruising and skin lesion.; ; Neuro: Negative for headache, lightheadedness and neck stiffness. Negative for weakness, altered level of consciousness, altered mental status, extremity weakness, paresthesias, involuntary movement, seizure and syncope.     Physical Exam Updated Vital Signs BP (!) 147/37 (BP Location: Left Arm)   Pulse 65   Temp 97.8 F (36.6 C) (Oral)   Resp 18   Ht 5\' 4"  (1.626 m)   Wt 168 lb (76.2 kg)   SpO2 99%   BMI 28.84 kg/m   Physical Exam 1520: Physical examination: Vital signs and O2 SAT: Reviewed; Constitutional: Well developed, Well nourished, Well hydrated, In no acute distress; Head and Face: Normocephalic, Atraumatic; Eyes: EOMI, PERRL, No  scleral icterus; ENMT: Mouth and pharynx normal, Mucous membranes moist; Neck: Supple, Trachea midline; Spine: No midline CS, TS, LS tenderness.; Cardiovascular: Regular rate and rhythm, No gallop; Respiratory: Breath sounds clear & equal bilaterally, No wheezes, Normal respiratory effort/excursion; Chest: Nontender, No deformity, Movement normal, No crepitus, No abrasions or ecchymosis.; Abdomen: Soft, Nontender, Nondistended, Normal bowel sounds, No abrasions or ecchymosis.; Genitourinary: No CVA tenderness;; Extremities: No deformity, Full range of motion major/large joints of bilat UE's and LE's without pain or tenderness to palp, Neurovascularly intact, Pulses normal, No tenderness, No edema, Pelvis stable. +small superficial skin tears to right elbow, bilat lower anterior tibial areas. Scattered ecchymosis in various stages of healing to bilat anterior tibial areas. NT right shoulder/elbow/wrist/hand. NT bilat knees/ankles/feet.; Neuro: AA&Ox3, GCS 15.  Major CN grossly intact. Speech clear. No gross focal motor or sensory deficits in extremities.; Skin: Color normal, Warm, Dry    ED Treatments / Results  Labs (all labs ordered are listed, but only  abnormal results are displayed)   EKG  EKG Interpretation None       Radiology   Procedures Procedures (including critical care time)  Medications Ordered in ED Medications - No data to display   Initial Impression / Assessment and Plan / ED Course  I have reviewed the triage vital signs and the nursing notes.  Pertinent labs & imaging results that were available during my care of the patient were reviewed by me and considered in my medical decision making (see chart for details).  MDM Reviewed: previous chart, nursing note and vitals Interpretation: x-ray   Dg Ribs Unilateral W/chest Right Result Date: 03/11/2016 CLINICAL DATA:  Fall, right rib pain EXAM: RIGHT RIBS AND CHEST - 3+ VIEW COMPARISON:  04/07/2014 FINDINGS: Four views right ribs submitted. No acute infiltrate or pulmonary edema. No right rib fracture is identified. No pneumothorax. IMPRESSION: Negative. Electronically Signed   By: Lahoma Crocker M.D.   On: 03/11/2016 15:05     1525:  XR ribs reassuring. Wound care provided. Td already UTD. Pt wants to go home now. Dx and testing d/w pt and family.  Questions answered.  Verb understanding, agreeable to d/c home with outpt f/u.      Final Clinical Impressions(s) / ED Diagnoses   Final diagnoses:  None    New Prescriptions New Prescriptions   No medications on file     Francine Graven, DO 03/14/16 1702

## 2016-03-11 NOTE — Discharge Instructions (Signed)
Take over the counter tylenol, as directed on packaging, as needed for discomfort.  Wash the skin tear areas with soap and water at least twice a day, and cover with a clean/dry dressing.  Change the dressing whenever it becomes wet or soiled after washing the area with soap and water.  Call your regular medical doctor on Monday to schedule a follow up appointment in the next 2 to 3 days.  Return to the Emergency Department immediately if worsening.

## 2016-03-11 NOTE — ED Notes (Signed)
EDP at bedside  

## 2016-03-11 NOTE — ED Notes (Signed)
Irrigated and cleaned skin tears to pt's RT elbow, and bilateral shins. Non-adherent dressing placed on wound to RT elbow. Band-aids placed on skin tears to shins. Extra supplies given to family for care at home.

## 2016-03-21 ENCOUNTER — Emergency Department (HOSPITAL_COMMUNITY): Payer: Medicare Other

## 2016-03-21 ENCOUNTER — Emergency Department (HOSPITAL_COMMUNITY)
Admission: EM | Admit: 2016-03-21 | Discharge: 2016-03-22 | Disposition: A | Discharge status: Home / Self Care | Payer: Medicare Other | Attending: Emergency Medicine | Admitting: Emergency Medicine

## 2016-03-21 ENCOUNTER — Encounter (HOSPITAL_COMMUNITY): Payer: Self-pay | Admitting: *Deleted

## 2016-03-21 DIAGNOSIS — R350 Frequency of micturition: Secondary | ICD-10-CM | POA: Insufficient documentation

## 2016-03-21 DIAGNOSIS — I10 Essential (primary) hypertension: Secondary | ICD-10-CM | POA: Diagnosis not present

## 2016-03-21 DIAGNOSIS — Y92009 Unspecified place in unspecified non-institutional (private) residence as the place of occurrence of the external cause: Secondary | ICD-10-CM | POA: Diagnosis not present

## 2016-03-21 DIAGNOSIS — T148XXA Other injury of unspecified body region, initial encounter: Secondary | ICD-10-CM

## 2016-03-21 DIAGNOSIS — S81011A Laceration without foreign body, right knee, initial encounter: Secondary | ICD-10-CM | POA: Diagnosis not present

## 2016-03-21 DIAGNOSIS — M549 Dorsalgia, unspecified: Secondary | ICD-10-CM | POA: Diagnosis not present

## 2016-03-21 DIAGNOSIS — S0990XA Unspecified injury of head, initial encounter: Secondary | ICD-10-CM | POA: Insufficient documentation

## 2016-03-21 DIAGNOSIS — Z7984 Long term (current) use of oral hypoglycemic drugs: Secondary | ICD-10-CM | POA: Diagnosis not present

## 2016-03-21 DIAGNOSIS — Z87891 Personal history of nicotine dependence: Secondary | ICD-10-CM | POA: Diagnosis not present

## 2016-03-21 DIAGNOSIS — Y999 Unspecified external cause status: Secondary | ICD-10-CM | POA: Insufficient documentation

## 2016-03-21 DIAGNOSIS — Z79899 Other long term (current) drug therapy: Secondary | ICD-10-CM | POA: Insufficient documentation

## 2016-03-21 DIAGNOSIS — Y939 Activity, unspecified: Secondary | ICD-10-CM | POA: Insufficient documentation

## 2016-03-21 DIAGNOSIS — S51812A Laceration without foreign body of left forearm, initial encounter: Secondary | ICD-10-CM | POA: Diagnosis not present

## 2016-03-21 DIAGNOSIS — E119 Type 2 diabetes mellitus without complications: Secondary | ICD-10-CM | POA: Diagnosis not present

## 2016-03-21 DIAGNOSIS — S0083XA Contusion of other part of head, initial encounter: Secondary | ICD-10-CM | POA: Diagnosis not present

## 2016-03-21 DIAGNOSIS — S064X0A Epidural hemorrhage without loss of consciousness, initial encounter: Secondary | ICD-10-CM | POA: Diagnosis not present

## 2016-03-21 DIAGNOSIS — I6782 Cerebral ischemia: Secondary | ICD-10-CM | POA: Diagnosis not present

## 2016-03-21 DIAGNOSIS — S098XXA Other specified injuries of head, initial encounter: Secondary | ICD-10-CM | POA: Diagnosis not present

## 2016-03-21 DIAGNOSIS — S2241XA Multiple fractures of ribs, right side, initial encounter for closed fracture: Secondary | ICD-10-CM | POA: Diagnosis not present

## 2016-03-21 DIAGNOSIS — S299XXA Unspecified injury of thorax, initial encounter: Secondary | ICD-10-CM | POA: Diagnosis present

## 2016-03-21 DIAGNOSIS — W19XXXA Unspecified fall, initial encounter: Secondary | ICD-10-CM

## 2016-03-21 DIAGNOSIS — W1839XA Other fall on same level, initial encounter: Secondary | ICD-10-CM | POA: Diagnosis not present

## 2016-03-21 DIAGNOSIS — S0511XA Contusion of eyeball and orbital tissues, right eye, initial encounter: Secondary | ICD-10-CM | POA: Diagnosis not present

## 2016-03-21 DIAGNOSIS — S2231XA Fracture of one rib, right side, initial encounter for closed fracture: Secondary | ICD-10-CM

## 2016-03-21 LAB — URINALYSIS, ROUTINE W REFLEX MICROSCOPIC
Bilirubin Urine: NEGATIVE
Glucose, UA: NEGATIVE mg/dL
Hgb urine dipstick: NEGATIVE
Ketones, ur: NEGATIVE mg/dL
Leukocytes, UA: NEGATIVE
Nitrite: NEGATIVE
PROTEIN: NEGATIVE mg/dL
pH: 5.5 (ref 5.0–8.0)

## 2016-03-21 LAB — I-STAT CHEM 8, ED
BUN: 24 mg/dL — ABNORMAL HIGH (ref 6–20)
CALCIUM ION: 1.2 mmol/L (ref 1.15–1.40)
CHLORIDE: 106 mmol/L (ref 101–111)
CREATININE: 1.7 mg/dL — AB (ref 0.44–1.00)
Glucose, Bld: 119 mg/dL — ABNORMAL HIGH (ref 65–99)
HCT: 30 % — ABNORMAL LOW (ref 36.0–46.0)
HEMOGLOBIN: 10.2 g/dL — AB (ref 12.0–15.0)
Potassium: 4.2 mmol/L (ref 3.5–5.1)
Sodium: 142 mmol/L (ref 135–145)
TCO2: 21 mmol/L (ref 0–100)

## 2016-03-21 MED ORDER — HYDROCODONE-ACETAMINOPHEN 5-325 MG PO TABS: 1.0000 | ORAL_TABLET | ORAL | 0 refills | Status: DC | PRN

## 2016-03-21 MED ORDER — HYDROCODONE-ACETAMINOPHEN 5-325 MG PO TABS
1.0000 | ORAL_TABLET | Freq: Once | ORAL | Status: AC
  Administered 2016-03-21: 1 via ORAL
  Filled 2016-03-21: qty 1

## 2016-03-21 NOTE — ED Triage Notes (Signed)
Pt brought in by [facility] for c/o fall; pt has bruising to right eye; pt states she was looking at some flowers and fell; pt denies any dizziness or loc

## 2016-03-21 NOTE — Discharge Instructions (Signed)
If you were given medicines take as directed.  If you are on coumadin or contraceptives realize their levels and effectiveness is altered by many different medicines.  If you have any reaction (rash, tongues swelling, other) to the medicines stop taking and see a physician.    If your blood pressure was elevated in the ER make sure you follow up for management with a primary doctor or return for chest pain, shortness of breath or stroke symptoms.  Please follow up as directed and return to the ER or see a physician for new or worsening symptoms.  Thank you. Vitals:   03/21/16 2045 03/21/16 2047 03/21/16 2136  BP:  166/61 132/61  Pulse:  88 88  Resp:  16 16  Temp:  98 F (36.7 C) 97.6 F (36.4 C)  TempSrc:  Oral   SpO2:  98% 100%  Weight: 168 lb (76.2 kg)    Height: 5\' 4"  (1.626 m)

## 2016-03-21 NOTE — ED Provider Notes (Signed)
[facility] DEPT Provider Note   CSN: [id number] Arrival date & time: 03/21/16  2039  By signing my name below, I, Gwenlyn Fudge, attest that this documentation has been prepared under the direction and in the presence of Elnora Morrison, MD. Electronically Signed: Gwenlyn Fudge, ED Scribe. 03/21/16. 9:19 PM.   History   Chief Complaint Chief Complaint  Patient presents with  . Fall   The history is provided by the patient. No language interpreter was used.   HPI Comments: Meghan Duran is a 82 y.o. female with PMHx of Cancer, DM, TIA, HTN and Neuropathy who presents to the Emergency Department complaining of multiple abrasions and head injury s/p fall at home today. Pt was walking while looking at new flowers on the cabinet. She fell forward and struck her head. She denies LOC. She notes multiple abrasions to her knee and arms. She was seen in ED on 03/11/16 for another fall and states these last 2 falls were out of the ordinary for her. Pt uses a cane when she walks outside, but not when in her house. She states she is not on any blood thinners. Pt denies dizziness, vomiting, fever, chills.  Past Medical History:  Diagnosis Date  . Anxiety 09/22/2013  . Cancer ([place])   . Depression 09/22/2013  . Diabetes mellitus without complication ([place])   . GERD (gastroesophageal reflux disease) 09/22/2013  . HTN (hypertension) 09/22/2013  . Hypertension   . Neuropathy ([facility])    Feet and legs  . Pernicious anemia   . Thyroid nodule   . Tonsil cancer [facility])     Patient Active Problem List   Diagnosis Date Noted  . Insomnia 03/11/2014  . TIA (transient ischemic attack) 09/22/2013  . Right sided weakness 09/22/2013  . Depression 09/22/2013  . Anxiety 09/22/2013  . GERD (gastroesophageal reflux disease) 09/22/2013  . UTI (lower urinary tract infection) 09/22/2013  . ARF (acute renal failure) ([place]) 09/22/2013  . HTN (hypertension) 09/22/2013  . Thyroid nodule 09/22/2013  . Diabetic  neuropathy, type II diabetes mellitus ([place]) 02/19/2013  . Heme positive stool 02/19/2013  . Pernicious anemia 11/20/2012  . DM II (diabetes mellitus, type II), controlled ([place]) 11/20/2012  . Renal insufficiency 11/20/2012  . Abdominal pain, unspecified site 11/20/2012  . Hyperlipemia 11/20/2012    Past Surgical History:  Procedure Laterality Date  . ABDOMINAL HYSTERECTOMY    . APPENDECTOMY    . BLADDER SURGERY    . CHOLECYSTECTOMY    . RECTAL SURGERY      OB History    No data available       Home Medications    Prior to Admission medications   Medication Sig Start Date End Date Taking? Authorizing Provider  busPIRone (BUSPAR) 15 MG tablet TAKE ONE TABLET BY MOUTH TWICE DAILY (MORNING & IN THE EVENING) 03/25/15  Yes [name], MD  cloNIDine (CATAPRES) 0.1 MG tablet TAKE ONE TABLET BY MOUTH TWICE DAILY. (MORNING AND IN THE EVENING) 04/28/15  Yes [name], MD  metFORMIN (GLUCOPHAGE) 500 MG tablet Take 1 tablet (500 mg total) by mouth 2 (two) times daily. 04/28/15  Yes [name], MD  ALPRAZolam (XANAX) 0.5 MG tablet TAKE ONE TABLET BY MOUTH AT BEDTIME AS NEEDED FOR SLEEP OR ANXIETY. (BEDTIME) 04/28/15   [name], MD  buPROPion (WELLBUTRIN SR) 150 MG 12 hr tablet Take 1 tablet (150 mg total) by mouth daily. 10/21/14   [name], MD  furosemide (LASIX) 20 MG tablet Take  1 tablet (20 mg total) by mouth daily as needed. Patient not taking: Reported on 04/26/2015 11/27/14   [name], MD  HYDROcodone-acetaminophen (NORCO) 5-325 MG tablet Take 1 tablet by mouth every 4 (four) hours as needed. 03/21/16   Elnora Morrison, MD  HYDROcodone-acetaminophen (NORCO) 5-325 MG tablet Take 1 tablet by mouth every 4 (four) hours as needed. 03/21/16   Elnora Morrison, MD  loratadine (CLARITIN) 10 MG tablet TAKE ONE TABLET BY MOUTH DAILY. (IN THE EVENING) 04/28/15   [name], MD  losartan (COZAAR) 50 MG tablet TAKE ONE TABLET BY MOUTH ONCE DAILY (MORNING) 07/27/15   [name], MD  meclizine (ANTIVERT) 12.5 MG tablet TAKE ONE TABLET BY MOUTH TWICE DAILY AS NEEDED (MORNING & IN THE EVENING) 03/25/15   [name], MD  metoprolol succinate (TOPROL-XL) 25 MG 24 hr tablet TAKE ONE TABLET BY MOUTH IN THE EVENING 03/25/15   [name], MD  pravastatin (PRAVACHOL) 40 MG tablet TAKE ONE TABLET BY MOUTH EVERY EVENING 03/25/15   [name], MD  Angelia Mould TEST test strip TEST ONCE A DAY 04/02/15   [name], MD    Family History Family History  Problem Relation Age of Onset  . Diabetes Sister   . Heart attack Sister   . Cancer Sister     head neck     Social History Social History  Substance Use Topics  . Smoking status: Former Smoker    Quit date: 11/20/1985  . Smokeless tobacco: Never Used     Comment: Over 30 years ago  . Alcohol use Yes     Comment: occasional     Allergies   Amoxil [amoxicillin]; Cymbalta [duloxetine hcl]; Macrobid [nitrofurantoin macrocrystal]; Other; and Tramadol   Review of Systems Review of Systems  Constitutional: Negative for chills and fever.  HENT: Positive for facial swelling.   Gastrointestinal: Negative for vomiting.  Genitourinary: Positive for frequency. Negative for difficulty urinating and dysuria.  Musculoskeletal: Positive for back pain.  Skin: Positive for wound.  Neurological: Negative for dizziness and syncope.  All other systems reviewed and are negative.   Physical Exam Updated Vital Signs BP 152/59   Pulse 88   Temp 97.8 F (36.6 C) (Oral)   Resp 11   Ht 5\' 4"  (1.626 m)   Wt 168 lb (76.2 kg)   SpO2 99%   BMI 28.84 kg/m   Physical Exam  Constitutional: She is oriented to person, place, and time. She appears well-developed.  HENT:  Head: Normocephalic.  Eyes: Conjunctivae and EOM are normal. No scleral icterus.  Neck: Neck supple. No thyromegaly present.  Cardiovascular: Normal rate and regular rhythm.  Exam reveals no gallop and no friction rub.   No murmur  heard. Pulmonary/Chest: No stridor. She has no wheezes. She has no rales. She exhibits no tenderness.  Abdominal: Soft. She exhibits no distension. There is no tenderness. There is no rebound.  Musculoskeletal: Normal range of motion. She exhibits no edema.  No pain in the midline cervical region Some pain in right paraspinal thoracic region with light palpation Mild tenderness midline upper thoracic No lumbar tenderness midline Horizontal movement in the neck without discomfort Mild ecchymosis and swelling on the right lateral orbital region Right lower lateral rib mild tenderness No bruising to abdomen No wrist tenderness bilateral No pain in left hip or knee with ROM No pain in right hip or knee with ROM  Lymphadenopathy:    She has no cervical  adenopathy.  Neurological: She is alert and oriented to person, place, and time. She exhibits normal muscle tone. Coordination normal.  Visual fields grossly intact  Skin: No rash noted. No erythema.  Superficial skin tear left dorsal mid forearm bleeding controlled 2x superficial skin tears on the right anterior knee 1 is approx 5 cm the other is approx 3.5 cm  Psychiatric: She has a normal mood and affect. Her behavior is normal.    ED Treatments / Results  DIAGNOSTIC STUDIES: Oxygen Saturation is 98% on RA, normal by my interpretation.    COORDINATION OF CARE: 9:08 PM Discussed treatment plan with pt at bedside which includes CT Head and DG Chest and pt agreed to plan.  Labs (all labs ordered are listed, but only abnormal results are displayed) Labs Reviewed  URINALYSIS, ROUTINE W REFLEX MICROSCOPIC (NOT AT [facility]) - Abnormal; Notable for the following:       Result Value   Specific Gravity, Urine >1.030 (*)    All other components within normal limits  I-STAT CHEM 8, ED - Abnormal; Notable for the following:    BUN 24 (*)    Creatinine, Ser 1.70 (*)    Glucose, Bld 119 (*)    Hemoglobin 10.2 (*)    HCT 30.0 (*)    All other  components within normal limits    EKG  EKG Interpretation None       Radiology Dg Chest 2 View  Result Date: 03/21/2016 CLINICAL DATA:  [place], no loss of consciousness.  RIGHT eye bruising. EXAM: CHEST  2 VIEW COMPARISON:  Chest radiograph March 11, 2016 FINDINGS: Cardiomediastinal silhouette is normal. Mildly calcified aortic knob. No pleural effusions or focal consolidations. Trachea projects midline and there is no pneumothorax. Mildly displaced RIGHT fifth, sixth, seventh rib fractures without definite healing mild upper thoracic compression fracture. Osteopenia. IMPRESSION: Acute versus subacute mildly displaced RIGHT fifth through seventh rib fractures. Mild age indeterminate upper thoracic compression fracture. No acute cardiopulmonary process. Electronically Signed   By: Elon Alas M.D.   On: 03/21/2016 21:56   Ct Head Wo Contrast  Result Date: 03/21/2016 CLINICAL DATA:  Status post fall, with bruising about the right orbit. Initial encounter. EXAM: CT HEAD WITHOUT CONTRAST TECHNIQUE: Contiguous axial images were obtained from the base of the skull through the vertex without intravenous contrast. COMPARISON:  CT of the head and MRI of the brain performed 02/04/2014 FINDINGS: Brain: No evidence of acute infarction, hemorrhage, hydrocephalus, extra-axial collection or mass lesion/mass effect. Prominence of the ventricles and sulci reflects mild to moderate cortical volume loss. Mild cerebellar atrophy is noted. Scattered periventricular white matter change likely reflects small vessel ischemic microangiopathy. The brainstem and fourth ventricle are within normal limits. The basal ganglia are unremarkable in appearance. The cerebral hemispheres demonstrate grossly normal gray-white differentiation. No mass effect or midline shift is seen. Vascular: No hyperdense vessel or unexpected calcification. Skull: There is no evidence of fracture; visualized osseous structures are unremarkable  in appearance. Sinuses/Orbits: The visualized portions of the orbits are within normal limits. The paranasal sinuses and mastoid air cells are well-aerated. Other: Minimal soft tissue swelling is suggested overlying the right orbit. IMPRESSION: 1. No evidence of traumatic intracranial injury or fracture. 2. Minimal soft tissue swelling overlying the right orbit. 3. Mild to moderate cortical volume loss and scattered small vessel ischemic microangiopathy. Electronically Signed   By: Garald Balding M.D.   On: 03/21/2016 22:22    Procedures Procedures (including critical care time)  Medications Ordered  in ED Medications  HYDROcodone-acetaminophen (NORCO/VICODIN) 5-325 MG per tablet 1 tablet (1 tablet Oral Given 03/21/16 2139)     Initial Impression / Assessment and Plan / ED Course  I have reviewed the triage vital signs and the nursing notes.  Pertinent labs & imaging results that were available during my care of the patient were reviewed by me and considered in my medical decision making (see chart for details).  Clinical Course   Patient presents after fall. Patient neurologically intact feels well aside from most: Skeletal injuries. Chest x-ray revealed multiple rib fractures. Patient no shortness of breath vitals unremarkable. Discussed spirometry, pain medicines, reasons to return. Discussed with daughter recommendations of rehabilitation placement for strengthening and pain control to be arranged by primary physician and family.  Results and differential diagnosis were discussed with the patient/parent/guardian. Xrays were independently reviewed by myself.  Close follow up outpatient was discussed, comfortable with the plan.   Medications  HYDROcodone-acetaminophen (NORCO/VICODIN) 5-325 MG per tablet 1 tablet (1 tablet Oral Given 03/21/16 2139)    Vitals:   03/21/16 2136 03/21/16 2230 03/21/16 2240 03/21/16 2300  BP: 132/61 152/62 152/62 152/59  Pulse: 88 89 89 88  Resp: 16 19 15  11   Temp: 97.6 F (36.4 C)  97.8 F (36.6 C)   TempSrc:   Oral   SpO2: 100% 100% 99% 99%  Weight:      Height:        Final diagnoses:  Fall, initial encounter  Facial contusion, initial encounter  Skin avulsion  Rib fractures, right, closed, initial encounter    Final Clinical Impressions(s) / ED Diagnoses   Final diagnoses:  Fall, initial encounter  Facial contusion, initial encounter  Skin avulsion  Rib fractures, right, closed, initial encounter    New Prescriptions New Prescriptions   HYDROCODONE-ACETAMINOPHEN (NORCO) 5-325 MG TABLET    Take 1 tablet by mouth every 4 (four) hours as needed.   HYDROCODONE-ACETAMINOPHEN (NORCO) 5-325 MG TABLET    Take 1 tablet by mouth every 4 (four) hours as needed.     Elnora Morrison, MD 03/21/16 2352

## 2016-03-22 ENCOUNTER — Telehealth: Payer: Self-pay | Admitting: Family Medicine

## 2016-03-22 NOTE — Telephone Encounter (Signed)
Patient has had a fall last week and last night.  She has had 3 total falls in the last 30 days.  She went to the ER last night.  She has 3 broken ribs, possible compression fracture of one of her vertebrae.  The doctor suggested she be put in a temporary living facility and [name] wanted to know Dr. Bary Leriche thoughts on this and how to get started.  Please advise.

## 2016-03-22 NOTE — ED Notes (Signed)
Clean skin tears to left and right arms, right knee, and left shin, covered with bandages, patient given prepackage of six hydrocodone-acetaminophen, quantity six and instructions on use.

## 2016-03-23 NOTE — Telephone Encounter (Signed)
Side note-patient with her traditional Medicare would not qualify to be in a nursing home. Family cannot afford private pay. They will be doing home health we will see her tomorrow and we will initiate referral

## 2016-03-23 NOTE — Telephone Encounter (Signed)
Please schedule the patient for 11:30 tomorrow. With me. I already informed Meghan Duran to come at that time

## 2016-03-23 NOTE — Telephone Encounter (Signed)
Appointment made

## 2016-03-23 NOTE — Telephone Encounter (Signed)
Pt's daughter, [name], has called to check status of this request. Really worried about her mom and her frequent falls.  Feels her mom is no longer able to live alone.  Please advise

## 2016-03-24 ENCOUNTER — Encounter: Payer: Self-pay | Admitting: Family Medicine

## 2016-03-24 ENCOUNTER — Ambulatory Visit (INDEPENDENT_AMBULATORY_CARE_PROVIDER_SITE_OTHER): Payer: Medicare Other | Admitting: Family Medicine

## 2016-03-24 VITALS — BP 124/62 | Ht 65.0 in | Wt 172.1 lb

## 2016-03-24 DIAGNOSIS — I10 Essential (primary) hypertension: Secondary | ICD-10-CM | POA: Diagnosis not present

## 2016-03-24 DIAGNOSIS — D510 Vitamin B12 deficiency anemia due to intrinsic factor deficiency: Secondary | ICD-10-CM | POA: Diagnosis not present

## 2016-03-24 DIAGNOSIS — E785 Hyperlipidemia, unspecified: Secondary | ICD-10-CM | POA: Diagnosis not present

## 2016-03-24 DIAGNOSIS — R5383 Other fatigue: Secondary | ICD-10-CM | POA: Diagnosis not present

## 2016-03-24 DIAGNOSIS — E119 Type 2 diabetes mellitus without complications: Secondary | ICD-10-CM

## 2016-03-24 MED ORDER — MUPIROCIN 2 % EX OINT: TOPICAL_OINTMENT | CUTANEOUS | 0 refills | Status: AC

## 2016-03-24 MED ORDER — HYDROCODONE-ACETAMINOPHEN 5-325 MG PO TABS: ORAL_TABLET | ORAL | 0 refills | Status: AC

## 2016-03-24 NOTE — Progress Notes (Signed)
   Subjective:    Patient ID: Meghan Duran, female    DOB: 1924-06-26, 82 y.o.   MRN: [id number]  Fall  The accident occurred more than 1 week ago. The point of impact was the face, right knee and right elbow. The pain is mild. She has tried nothing for the symptoms. The treatment provided no relief.  Patient also here to discuss home health consultation. Patient would also like to discuss wellbutrin effects on her falling.  Increase difficulty with strength movement activity. Family concerned for now she is staying with the daughter there is possibility down the road she may not having to be in assisted living she is originally from [place] and hopes to go back. She denies any headaches she did have rib fractures.    Review of Systems     Objective:   Physical Exam Contusions noted that if a skin tear noted in the elbow left knee and left arm lungs clear hearts regular pulse normal blood pressure sitting standing good       Assessment & Plan:  Multiple skin tears are noted on the right elbow left knee dressings were applied  Contusions to the face CAT scan looked good no further x-rays  Rib fractures should heal up without any serious consequence  Significant ataxia-face-to-face evaluation was done for the purpose of evaluating this patient not orthostatic. Would benefit from home health as well as physical therapy for strength training it would be too difficult for her to come to the physical therapy department a she cannot drive  Multiple underlying health issues lab work ordered recommend follow-up office visit in a few weeks time B12 as well as flu vaccine at that time

## 2016-03-27 ENCOUNTER — Telehealth: Payer: Self-pay | Admitting: Family Medicine

## 2016-03-27 DIAGNOSIS — R270 Ataxia, unspecified: Secondary | ICD-10-CM

## 2016-03-27 NOTE — Telephone Encounter (Signed)
Nurse's-please put in home health consultation because of fall, ataxia, fractured ribs will need home health as well as physical therapy is quickly as possible thank you

## 2016-03-27 NOTE — Telephone Encounter (Signed)
Pt's daughter called to check on home health referral Not in my "Q"  Please advise

## 2016-03-27 NOTE — Telephone Encounter (Signed)
Referral ordered in EPIC. 

## 2016-03-28 ENCOUNTER — Telehealth: Payer: Self-pay | Admitting: Family Medicine

## 2016-03-28 NOTE — Telephone Encounter (Signed)
ERROR

## 2016-03-29 MED FILL — Hydrocodone-Acetaminophen Tab 5-325 MG: ORAL | Qty: 6 | Status: AC

## 2016-03-30 DIAGNOSIS — Z7982 Long term (current) use of aspirin: Secondary | ICD-10-CM | POA: Diagnosis not present

## 2016-03-30 DIAGNOSIS — I10 Essential (primary) hypertension: Secondary | ICD-10-CM | POA: Diagnosis not present

## 2016-03-30 DIAGNOSIS — Z8781 Personal history of (healed) traumatic fracture: Secondary | ICD-10-CM | POA: Diagnosis not present

## 2016-03-30 DIAGNOSIS — Z9181 History of falling: Secondary | ICD-10-CM | POA: Diagnosis not present

## 2016-03-30 DIAGNOSIS — Z7984 Long term (current) use of oral hypoglycemic drugs: Secondary | ICD-10-CM | POA: Diagnosis not present

## 2016-03-30 DIAGNOSIS — R260 Ataxic gait: Secondary | ICD-10-CM | POA: Diagnosis not present

## 2016-03-30 DIAGNOSIS — S8991XD Unspecified injury of right lower leg, subsequent encounter: Secondary | ICD-10-CM | POA: Diagnosis not present

## 2016-03-30 DIAGNOSIS — W19XXXD Unspecified fall, subsequent encounter: Secondary | ICD-10-CM | POA: Diagnosis not present

## 2016-03-30 DIAGNOSIS — S80921D Unspecified superficial injury of right lower leg, subsequent encounter: Secondary | ICD-10-CM | POA: Diagnosis not present

## 2016-03-30 DIAGNOSIS — E119 Type 2 diabetes mellitus without complications: Secondary | ICD-10-CM | POA: Diagnosis not present

## 2016-03-30 DIAGNOSIS — Z5181 Encounter for therapeutic drug level monitoring: Secondary | ICD-10-CM | POA: Diagnosis not present

## 2016-03-31 DIAGNOSIS — W19XXXD Unspecified fall, subsequent encounter: Secondary | ICD-10-CM | POA: Diagnosis not present

## 2016-03-31 DIAGNOSIS — S80921D Unspecified superficial injury of right lower leg, subsequent encounter: Secondary | ICD-10-CM | POA: Diagnosis not present

## 2016-03-31 DIAGNOSIS — E119 Type 2 diabetes mellitus without complications: Secondary | ICD-10-CM | POA: Diagnosis not present

## 2016-03-31 DIAGNOSIS — S8991XD Unspecified injury of right lower leg, subsequent encounter: Secondary | ICD-10-CM | POA: Diagnosis not present

## 2016-03-31 DIAGNOSIS — I10 Essential (primary) hypertension: Secondary | ICD-10-CM | POA: Diagnosis not present

## 2016-03-31 DIAGNOSIS — R260 Ataxic gait: Secondary | ICD-10-CM | POA: Diagnosis not present

## 2016-04-03 DIAGNOSIS — I10 Essential (primary) hypertension: Secondary | ICD-10-CM | POA: Diagnosis not present

## 2016-04-03 DIAGNOSIS — R260 Ataxic gait: Secondary | ICD-10-CM | POA: Diagnosis not present

## 2016-04-03 DIAGNOSIS — S80921D Unspecified superficial injury of right lower leg, subsequent encounter: Secondary | ICD-10-CM | POA: Diagnosis not present

## 2016-04-03 DIAGNOSIS — S8991XD Unspecified injury of right lower leg, subsequent encounter: Secondary | ICD-10-CM | POA: Diagnosis not present

## 2016-04-03 DIAGNOSIS — W19XXXD Unspecified fall, subsequent encounter: Secondary | ICD-10-CM | POA: Diagnosis not present

## 2016-04-03 DIAGNOSIS — E119 Type 2 diabetes mellitus without complications: Secondary | ICD-10-CM | POA: Diagnosis not present

## 2016-04-05 DIAGNOSIS — I10 Essential (primary) hypertension: Secondary | ICD-10-CM | POA: Diagnosis not present

## 2016-04-05 DIAGNOSIS — W19XXXD Unspecified fall, subsequent encounter: Secondary | ICD-10-CM | POA: Diagnosis not present

## 2016-04-05 DIAGNOSIS — E119 Type 2 diabetes mellitus without complications: Secondary | ICD-10-CM | POA: Diagnosis not present

## 2016-04-05 DIAGNOSIS — R260 Ataxic gait: Secondary | ICD-10-CM | POA: Diagnosis not present

## 2016-04-05 DIAGNOSIS — S8991XD Unspecified injury of right lower leg, subsequent encounter: Secondary | ICD-10-CM | POA: Diagnosis not present

## 2016-04-05 DIAGNOSIS — S80921D Unspecified superficial injury of right lower leg, subsequent encounter: Secondary | ICD-10-CM | POA: Diagnosis not present

## 2016-04-06 DIAGNOSIS — W19XXXD Unspecified fall, subsequent encounter: Secondary | ICD-10-CM | POA: Diagnosis not present

## 2016-04-06 DIAGNOSIS — E119 Type 2 diabetes mellitus without complications: Secondary | ICD-10-CM | POA: Diagnosis not present

## 2016-04-06 DIAGNOSIS — S80921D Unspecified superficial injury of right lower leg, subsequent encounter: Secondary | ICD-10-CM | POA: Diagnosis not present

## 2016-04-06 DIAGNOSIS — S8991XD Unspecified injury of right lower leg, subsequent encounter: Secondary | ICD-10-CM | POA: Diagnosis not present

## 2016-04-06 DIAGNOSIS — R260 Ataxic gait: Secondary | ICD-10-CM | POA: Diagnosis not present

## 2016-04-06 DIAGNOSIS — I10 Essential (primary) hypertension: Secondary | ICD-10-CM | POA: Diagnosis not present

## 2016-04-07 DIAGNOSIS — S80921D Unspecified superficial injury of right lower leg, subsequent encounter: Secondary | ICD-10-CM | POA: Diagnosis not present

## 2016-04-07 DIAGNOSIS — E119 Type 2 diabetes mellitus without complications: Secondary | ICD-10-CM | POA: Diagnosis not present

## 2016-04-07 DIAGNOSIS — S8991XD Unspecified injury of right lower leg, subsequent encounter: Secondary | ICD-10-CM | POA: Diagnosis not present

## 2016-04-07 DIAGNOSIS — I10 Essential (primary) hypertension: Secondary | ICD-10-CM | POA: Diagnosis not present

## 2016-04-07 DIAGNOSIS — W19XXXD Unspecified fall, subsequent encounter: Secondary | ICD-10-CM | POA: Diagnosis not present

## 2016-04-07 DIAGNOSIS — R260 Ataxic gait: Secondary | ICD-10-CM | POA: Diagnosis not present

## 2016-04-10 DIAGNOSIS — R260 Ataxic gait: Secondary | ICD-10-CM | POA: Diagnosis not present

## 2016-04-10 DIAGNOSIS — S8991XD Unspecified injury of right lower leg, subsequent encounter: Secondary | ICD-10-CM | POA: Diagnosis not present

## 2016-04-10 DIAGNOSIS — S80921D Unspecified superficial injury of right lower leg, subsequent encounter: Secondary | ICD-10-CM | POA: Diagnosis not present

## 2016-04-10 DIAGNOSIS — W19XXXD Unspecified fall, subsequent encounter: Secondary | ICD-10-CM | POA: Diagnosis not present

## 2016-04-10 DIAGNOSIS — E119 Type 2 diabetes mellitus without complications: Secondary | ICD-10-CM | POA: Diagnosis not present

## 2016-04-10 DIAGNOSIS — I10 Essential (primary) hypertension: Secondary | ICD-10-CM | POA: Diagnosis not present

## 2016-04-11 ENCOUNTER — Telehealth: Payer: Self-pay | Admitting: Family Medicine

## 2016-04-11 DIAGNOSIS — R260 Ataxic gait: Secondary | ICD-10-CM | POA: Diagnosis not present

## 2016-04-11 DIAGNOSIS — S8991XD Unspecified injury of right lower leg, subsequent encounter: Secondary | ICD-10-CM | POA: Diagnosis not present

## 2016-04-11 DIAGNOSIS — S80921D Unspecified superficial injury of right lower leg, subsequent encounter: Secondary | ICD-10-CM | POA: Diagnosis not present

## 2016-04-11 DIAGNOSIS — E119 Type 2 diabetes mellitus without complications: Secondary | ICD-10-CM | POA: Diagnosis not present

## 2016-04-11 NOTE — Telephone Encounter (Signed)
May order urinalysis with urine culture

## 2016-04-11 NOTE — Telephone Encounter (Signed)
Notified [facility] with [facility] may order urinalysis with urine culture.

## 2016-04-11 NOTE — Telephone Encounter (Signed)
Requesting UACNS.  She is having symptoms of a UTI.

## 2016-04-12 ENCOUNTER — Other Ambulatory Visit (HOSPITAL_COMMUNITY)
Admission: AD | Admit: 2016-04-12 | Discharge: 2016-04-12 | Disposition: A | Discharge status: Home / Self Care | Payer: Medicare Other | Source: Skilled Nursing Facility | Attending: Family Medicine | Admitting: Family Medicine

## 2016-04-12 DIAGNOSIS — I10 Essential (primary) hypertension: Secondary | ICD-10-CM | POA: Diagnosis not present

## 2016-04-12 DIAGNOSIS — N390 Urinary tract infection, site not specified: Secondary | ICD-10-CM | POA: Diagnosis not present

## 2016-04-12 DIAGNOSIS — E119 Type 2 diabetes mellitus without complications: Secondary | ICD-10-CM | POA: Diagnosis not present

## 2016-04-12 DIAGNOSIS — S80921D Unspecified superficial injury of right lower leg, subsequent encounter: Secondary | ICD-10-CM | POA: Diagnosis not present

## 2016-04-12 DIAGNOSIS — W19XXXD Unspecified fall, subsequent encounter: Secondary | ICD-10-CM | POA: Diagnosis not present

## 2016-04-12 DIAGNOSIS — S8991XD Unspecified injury of right lower leg, subsequent encounter: Secondary | ICD-10-CM | POA: Diagnosis not present

## 2016-04-12 DIAGNOSIS — R260 Ataxic gait: Secondary | ICD-10-CM | POA: Diagnosis not present

## 2016-04-12 LAB — URINALYSIS, ROUTINE W REFLEX MICROSCOPIC
BILIRUBIN URINE: NEGATIVE
GLUCOSE, UA: NEGATIVE mg/dL
HGB URINE DIPSTICK: NEGATIVE
Ketones, ur: NEGATIVE mg/dL
Leukocytes, UA: NEGATIVE
Nitrite: NEGATIVE
PH: 6 (ref 5.0–8.0)
Protein, ur: NEGATIVE mg/dL
SPECIFIC GRAVITY, URINE: 1.01 (ref 1.005–1.030)

## 2016-04-14 DIAGNOSIS — R260 Ataxic gait: Secondary | ICD-10-CM | POA: Diagnosis not present

## 2016-04-14 DIAGNOSIS — S80921D Unspecified superficial injury of right lower leg, subsequent encounter: Secondary | ICD-10-CM | POA: Diagnosis not present

## 2016-04-14 DIAGNOSIS — I10 Essential (primary) hypertension: Secondary | ICD-10-CM | POA: Diagnosis not present

## 2016-04-14 DIAGNOSIS — E119 Type 2 diabetes mellitus without complications: Secondary | ICD-10-CM | POA: Diagnosis not present

## 2016-04-14 DIAGNOSIS — W19XXXD Unspecified fall, subsequent encounter: Secondary | ICD-10-CM | POA: Diagnosis not present

## 2016-04-14 DIAGNOSIS — S8991XD Unspecified injury of right lower leg, subsequent encounter: Secondary | ICD-10-CM | POA: Diagnosis not present

## 2016-04-14 LAB — URINE CULTURE

## 2016-04-17 ENCOUNTER — Other Ambulatory Visit: Payer: Self-pay | Admitting: Family Medicine

## 2016-04-17 ENCOUNTER — Ambulatory Visit: Payer: Medicare Other | Admitting: Family Medicine

## 2016-04-17 DIAGNOSIS — E785 Hyperlipidemia, unspecified: Secondary | ICD-10-CM | POA: Diagnosis not present

## 2016-04-17 DIAGNOSIS — D510 Vitamin B12 deficiency anemia due to intrinsic factor deficiency: Secondary | ICD-10-CM | POA: Diagnosis not present

## 2016-04-17 DIAGNOSIS — E119 Type 2 diabetes mellitus without complications: Secondary | ICD-10-CM | POA: Diagnosis not present

## 2016-04-17 DIAGNOSIS — R5383 Other fatigue: Secondary | ICD-10-CM | POA: Diagnosis not present

## 2016-04-17 DIAGNOSIS — R8290 Unspecified abnormal findings in urine: Secondary | ICD-10-CM | POA: Diagnosis not present

## 2016-04-17 DIAGNOSIS — I10 Essential (primary) hypertension: Secondary | ICD-10-CM | POA: Diagnosis not present

## 2016-04-18 DIAGNOSIS — S80921D Unspecified superficial injury of right lower leg, subsequent encounter: Secondary | ICD-10-CM | POA: Diagnosis not present

## 2016-04-18 DIAGNOSIS — W19XXXD Unspecified fall, subsequent encounter: Secondary | ICD-10-CM | POA: Diagnosis not present

## 2016-04-18 DIAGNOSIS — S8991XD Unspecified injury of right lower leg, subsequent encounter: Secondary | ICD-10-CM | POA: Diagnosis not present

## 2016-04-18 DIAGNOSIS — R260 Ataxic gait: Secondary | ICD-10-CM | POA: Diagnosis not present

## 2016-04-18 DIAGNOSIS — I10 Essential (primary) hypertension: Secondary | ICD-10-CM | POA: Diagnosis not present

## 2016-04-18 DIAGNOSIS — E119 Type 2 diabetes mellitus without complications: Secondary | ICD-10-CM | POA: Diagnosis not present

## 2016-04-18 LAB — HEMOGLOBIN A1C
ESTIMATED AVERAGE GLUCOSE: 126 mg/dL
Hgb A1c MFr Bld: 6 % — ABNORMAL HIGH (ref 4.8–5.6)

## 2016-04-18 LAB — BASIC METABOLIC PANEL
BUN/Creatinine Ratio: 16 (ref 12–28)
BUN: 17 mg/dL (ref 10–36)
CALCIUM: 8.4 mg/dL — AB (ref 8.7–10.3)
CO2: 23 mmol/L (ref 18–29)
CREATININE: 1.08 mg/dL — AB (ref 0.57–1.00)
Chloride: 100 mmol/L (ref 96–106)
GFR calc Af Amer: 52 mL/min/{1.73_m2} — ABNORMAL LOW (ref >59)
GFR, EST NON AFRICAN AMERICAN: 45 mL/min/{1.73_m2} — AB (ref >59)
GLUCOSE: 102 mg/dL — AB (ref 65–99)
Potassium: 4.3 mmol/L (ref 3.5–5.2)
Sodium: 141 mmol/L (ref 134–144)

## 2016-04-18 LAB — LIPID PANEL
CHOLESTEROL TOTAL: 130 mg/dL (ref 100–199)
Chol/HDL Ratio: 3.4 ratio units (ref 0.0–4.4)
HDL: 38 mg/dL — AB (ref >39)
LDL Calculated: 73 mg/dL (ref 0–99)
Triglycerides: 94 mg/dL (ref 0–149)
VLDL CHOLESTEROL CAL: 19 mg/dL (ref 5–40)

## 2016-04-18 LAB — CBC WITH DIFFERENTIAL/PLATELET
BASOS: 1 %
Basophils Absolute: 0.1 10*3/uL (ref 0.0–0.2)
EOS (ABSOLUTE): 0.7 10*3/uL — ABNORMAL HIGH (ref 0.0–0.4)
EOS: 9 %
HEMATOCRIT: 29.4 % — AB (ref 34.0–46.6)
Hemoglobin: 9.2 g/dL — ABNORMAL LOW (ref 11.1–15.9)
IMMATURE GRANS (ABS): 0 10*3/uL (ref 0.0–0.1)
IMMATURE GRANULOCYTES: 0 %
LYMPHS: 15 %
Lymphocytes Absolute: 1.2 10*3/uL (ref 0.7–3.1)
MCH: 26.9 pg (ref 26.6–33.0)
MCHC: 31.3 g/dL — ABNORMAL LOW (ref 31.5–35.7)
MCV: 86 fL (ref 79–97)
MONOS ABS: 0.9 10*3/uL (ref 0.1–0.9)
Monocytes: 12 %
NEUTROS PCT: 63 %
Neutrophils Absolute: 5.2 10*3/uL (ref 1.4–7.0)
PLATELETS: 383 10*3/uL — AB (ref 150–379)
RBC: 3.42 x10E6/uL — AB (ref 3.77–5.28)
RDW: 14.2 % (ref 12.3–15.4)
WBC: 8.2 10*3/uL (ref 3.4–10.8)

## 2016-04-18 LAB — TSH: TSH: 2.64 u[IU]/mL (ref 0.450–4.500)

## 2016-04-19 LAB — URINALYSIS, ROUTINE W REFLEX MICROSCOPIC
BILIRUBIN UA: NEGATIVE
Glucose, UA: NEGATIVE
KETONES UA: NEGATIVE
Leukocytes, UA: NEGATIVE
Nitrite, UA: NEGATIVE
Protein, UA: NEGATIVE
RBC UA: NEGATIVE
SPEC GRAV UA: 1.011 (ref 1.005–1.030)
Urobilinogen, Ur: 0.2 mg/dL (ref 0.2–1.0)
pH, UA: 6 (ref 5.0–7.5)

## 2016-04-19 LAB — URINE CULTURE

## 2016-04-20 DIAGNOSIS — E119 Type 2 diabetes mellitus without complications: Secondary | ICD-10-CM | POA: Diagnosis not present

## 2016-04-20 DIAGNOSIS — S80921D Unspecified superficial injury of right lower leg, subsequent encounter: Secondary | ICD-10-CM | POA: Diagnosis not present

## 2016-04-20 DIAGNOSIS — S8991XD Unspecified injury of right lower leg, subsequent encounter: Secondary | ICD-10-CM | POA: Diagnosis not present

## 2016-04-20 DIAGNOSIS — R260 Ataxic gait: Secondary | ICD-10-CM | POA: Diagnosis not present

## 2016-04-20 DIAGNOSIS — I10 Essential (primary) hypertension: Secondary | ICD-10-CM | POA: Diagnosis not present

## 2016-04-20 DIAGNOSIS — W19XXXD Unspecified fall, subsequent encounter: Secondary | ICD-10-CM | POA: Diagnosis not present

## 2016-04-21 ENCOUNTER — Other Ambulatory Visit: Payer: Self-pay | Admitting: *Deleted

## 2016-04-21 DIAGNOSIS — D649 Anemia, unspecified: Secondary | ICD-10-CM

## 2016-04-24 DIAGNOSIS — E119 Type 2 diabetes mellitus without complications: Secondary | ICD-10-CM | POA: Diagnosis not present

## 2016-04-24 DIAGNOSIS — R260 Ataxic gait: Secondary | ICD-10-CM | POA: Diagnosis not present

## 2016-04-24 DIAGNOSIS — S80921D Unspecified superficial injury of right lower leg, subsequent encounter: Secondary | ICD-10-CM | POA: Diagnosis not present

## 2016-04-24 DIAGNOSIS — I10 Essential (primary) hypertension: Secondary | ICD-10-CM | POA: Diagnosis not present

## 2016-04-24 DIAGNOSIS — W19XXXD Unspecified fall, subsequent encounter: Secondary | ICD-10-CM | POA: Diagnosis not present

## 2016-04-24 DIAGNOSIS — S8991XD Unspecified injury of right lower leg, subsequent encounter: Secondary | ICD-10-CM | POA: Diagnosis not present

## 2016-04-24 DIAGNOSIS — D649 Anemia, unspecified: Secondary | ICD-10-CM | POA: Diagnosis not present

## 2016-04-25 LAB — FERRITIN: Ferritin: 281 ng/mL — ABNORMAL HIGH (ref 15–150)

## 2016-04-25 LAB — IRON AND TIBC
Iron Saturation: 16 % (ref 15–55)
Iron: 29 ug/dL (ref 27–139)
TIBC: 181 ug/dL — AB (ref 250–450)
UIBC: 152 ug/dL (ref 118–369)

## 2016-04-25 LAB — HEMATOCRIT: Hematocrit: 27.9 % — ABNORMAL LOW (ref 34.0–46.6)

## 2016-04-25 LAB — HEMOGLOBIN: Hemoglobin: 8.6 g/dL — ABNORMAL LOW (ref 11.1–15.9)

## 2016-04-27 ENCOUNTER — Telehealth: Payer: Self-pay | Admitting: Family Medicine

## 2016-04-27 ENCOUNTER — Other Ambulatory Visit: Payer: Self-pay

## 2016-04-27 DIAGNOSIS — S8991XD Unspecified injury of right lower leg, subsequent encounter: Secondary | ICD-10-CM | POA: Diagnosis not present

## 2016-04-27 DIAGNOSIS — W19XXXD Unspecified fall, subsequent encounter: Secondary | ICD-10-CM | POA: Diagnosis not present

## 2016-04-27 DIAGNOSIS — S80921D Unspecified superficial injury of right lower leg, subsequent encounter: Secondary | ICD-10-CM | POA: Diagnosis not present

## 2016-04-27 DIAGNOSIS — D649 Anemia, unspecified: Secondary | ICD-10-CM

## 2016-04-27 DIAGNOSIS — E119 Type 2 diabetes mellitus without complications: Secondary | ICD-10-CM | POA: Diagnosis not present

## 2016-04-27 DIAGNOSIS — I10 Essential (primary) hypertension: Secondary | ICD-10-CM | POA: Diagnosis not present

## 2016-04-27 DIAGNOSIS — R260 Ataxic gait: Secondary | ICD-10-CM | POA: Diagnosis not present

## 2016-04-27 LAB — POC HEMOCCULT BLD/STL (HOME/3-CARD/SCREEN)
Card #2 Fecal Occult Blod, POC: NEGATIVE
Card #3 Fecal Occult Blood, POC: NEGATIVE
FECAL OCCULT BLD: NEGATIVE

## 2016-04-27 MED ORDER — LOSARTAN POTASSIUM 50 MG PO TABS: ORAL_TABLET | ORAL | 5 refills | Status: AC

## 2016-04-27 MED ORDER — LORATADINE 10 MG PO TABS: ORAL_TABLET | ORAL | 5 refills | Status: AC

## 2016-04-27 MED ORDER — GLUCOSE BLOOD VI STRP: ORAL_STRIP | 5 refills | Status: AC

## 2016-04-27 NOTE — Telephone Encounter (Signed)
Prescriptions sent electronically to pharmacy. Patient notified. °

## 2016-04-27 NOTE — Telephone Encounter (Signed)
Pt is needing refills on her losartan (COZAAR) 50 MG tablet  and loratadine (CLARITIN) 10 MG tablet . Pt is also needing refills on her TRUETRACK TEST test strip  sent in.     Festus Barren

## 2016-05-02 ENCOUNTER — Ambulatory Visit (INDEPENDENT_AMBULATORY_CARE_PROVIDER_SITE_OTHER): Payer: Medicare Other | Admitting: Family Medicine

## 2016-05-02 ENCOUNTER — Encounter: Payer: Self-pay | Admitting: Family Medicine

## 2016-05-02 VITALS — BP 128/82 | Ht 65.0 in | Wt 175.0 lb

## 2016-05-02 DIAGNOSIS — D510 Vitamin B12 deficiency anemia due to intrinsic factor deficiency: Secondary | ICD-10-CM

## 2016-05-02 DIAGNOSIS — R5383 Other fatigue: Secondary | ICD-10-CM | POA: Diagnosis not present

## 2016-05-02 DIAGNOSIS — D649 Anemia, unspecified: Secondary | ICD-10-CM | POA: Diagnosis not present

## 2016-05-02 DIAGNOSIS — Z23 Encounter for immunization: Secondary | ICD-10-CM | POA: Diagnosis not present

## 2016-05-02 LAB — POCT HEMOGLOBIN: Hemoglobin: 9.5 g/dL — AB (ref 12.2–16.2)

## 2016-05-02 MED ORDER — CYANOCOBALAMIN 1000 MCG/ML IJ SOLN
1000.0000 ug | Freq: Once | INTRAMUSCULAR | Status: AC
  Administered 2016-05-02: 1000 ug via INTRAMUSCULAR

## 2016-05-02 NOTE — Progress Notes (Signed)
   Subjective:    Patient ID: Meghan Duran, female    DOB: August 29, 1923, 82 y.o.   MRN: [id number]  HPI Patient arrives for follow up on dizziness. Patient leaving for [place] on Thursday.   Patient relates she has a lot of fatigue and tiredness. She will get up and move around tired 1 [place] a background her appetite is fair she states she lost weight when she was in [place] but is gaining some back Recent lab work reviewed with the patient in detail including kidney functions look good cholesterol looks good but hemoglobin is low ferritin looks good Review of Systems She denies vomiting diarrhea fever chills sweats shortness of breath chest tightness denies rectal bleeding    Objective:   Physical Exam  Lungs are clear hearts regular abdomen soft rectal exam normal      Assessment & Plan:  Anemia-her hemoglobin is gone down considerably compared to last year. This is concerning for the possibility of underlying issue. A Hemoccult card today was negative. Her home Hemoccult cards were negative. The patient denies seeing blood in her stools but she did state a proximally 1 week ago she had black stools. She does relate fatigue and tiredness. She is going back to [place] where her home as I encouraged her to follow-up with her doctor for further evaluation I believe the patient may well need hematology consult to get to the bottom of this issue and it is possible that she may need gastroenterology consult  History pernicious anemia-B12 shot  Flu vaccine today

## 2016-05-03 DIAGNOSIS — E119 Type 2 diabetes mellitus without complications: Secondary | ICD-10-CM | POA: Diagnosis not present

## 2016-05-03 DIAGNOSIS — S8991XD Unspecified injury of right lower leg, subsequent encounter: Secondary | ICD-10-CM | POA: Diagnosis not present

## 2016-05-03 DIAGNOSIS — W19XXXD Unspecified fall, subsequent encounter: Secondary | ICD-10-CM | POA: Diagnosis not present

## 2016-05-03 DIAGNOSIS — S80921D Unspecified superficial injury of right lower leg, subsequent encounter: Secondary | ICD-10-CM | POA: Diagnosis not present

## 2016-05-03 DIAGNOSIS — R260 Ataxic gait: Secondary | ICD-10-CM | POA: Diagnosis not present

## 2016-05-03 DIAGNOSIS — I10 Essential (primary) hypertension: Secondary | ICD-10-CM | POA: Diagnosis not present

## 2016-05-05 DIAGNOSIS — D519 Vitamin B12 deficiency anemia, unspecified: Secondary | ICD-10-CM | POA: Diagnosis not present

## 2016-05-05 DIAGNOSIS — D649 Anemia, unspecified: Secondary | ICD-10-CM | POA: Diagnosis not present

## 2016-05-05 DIAGNOSIS — E1122 Type 2 diabetes mellitus with diabetic chronic kidney disease: Secondary | ICD-10-CM | POA: Diagnosis not present

## 2016-05-05 DIAGNOSIS — Z6828 Body mass index (BMI) 28.0-28.9, adult: Secondary | ICD-10-CM | POA: Diagnosis not present

## 2016-05-05 DIAGNOSIS — R2681 Unsteadiness on feet: Secondary | ICD-10-CM | POA: Diagnosis not present

## 2016-05-05 DIAGNOSIS — N183 Chronic kidney disease, stage 3 (moderate): Secondary | ICD-10-CM | POA: Diagnosis not present

## 2016-05-05 DIAGNOSIS — Z7182 Exercise counseling: Secondary | ICD-10-CM | POA: Diagnosis not present

## 2016-05-05 DIAGNOSIS — Z713 Dietary counseling and surveillance: Secondary | ICD-10-CM | POA: Diagnosis not present

## 2016-05-10 DIAGNOSIS — Z7984 Long term (current) use of oral hypoglycemic drugs: Secondary | ICD-10-CM | POA: Diagnosis not present

## 2016-05-10 DIAGNOSIS — I129 Hypertensive chronic kidney disease with stage 1 through stage 4 chronic kidney disease, or unspecified chronic kidney disease: Secondary | ICD-10-CM | POA: Diagnosis not present

## 2016-05-10 DIAGNOSIS — M1991 Primary osteoarthritis, unspecified site: Secondary | ICD-10-CM | POA: Diagnosis not present

## 2016-05-10 DIAGNOSIS — I2510 Atherosclerotic heart disease of native coronary artery without angina pectoris: Secondary | ICD-10-CM | POA: Diagnosis not present

## 2016-05-10 DIAGNOSIS — E1122 Type 2 diabetes mellitus with diabetic chronic kidney disease: Secondary | ICD-10-CM | POA: Diagnosis not present

## 2016-05-10 DIAGNOSIS — E1142 Type 2 diabetes mellitus with diabetic polyneuropathy: Secondary | ICD-10-CM | POA: Diagnosis not present

## 2016-05-10 DIAGNOSIS — R2689 Other abnormalities of gait and mobility: Secondary | ICD-10-CM | POA: Diagnosis not present

## 2016-05-10 DIAGNOSIS — N183 Chronic kidney disease, stage 3 (moderate): Secondary | ICD-10-CM | POA: Diagnosis not present

## 2016-05-10 DIAGNOSIS — F339 Major depressive disorder, recurrent, unspecified: Secondary | ICD-10-CM | POA: Diagnosis not present

## 2016-05-10 DIAGNOSIS — D519 Vitamin B12 deficiency anemia, unspecified: Secondary | ICD-10-CM | POA: Diagnosis not present

## 2016-05-10 DIAGNOSIS — S81812D Laceration without foreign body, left lower leg, subsequent encounter: Secondary | ICD-10-CM | POA: Diagnosis not present

## 2016-05-10 DIAGNOSIS — Z87891 Personal history of nicotine dependence: Secondary | ICD-10-CM | POA: Diagnosis not present

## 2016-05-11 DIAGNOSIS — R2689 Other abnormalities of gait and mobility: Secondary | ICD-10-CM | POA: Diagnosis not present

## 2016-05-11 DIAGNOSIS — D519 Vitamin B12 deficiency anemia, unspecified: Secondary | ICD-10-CM | POA: Diagnosis not present

## 2016-05-11 DIAGNOSIS — S81812D Laceration without foreign body, left lower leg, subsequent encounter: Secondary | ICD-10-CM | POA: Diagnosis not present

## 2016-05-11 DIAGNOSIS — I129 Hypertensive chronic kidney disease with stage 1 through stage 4 chronic kidney disease, or unspecified chronic kidney disease: Secondary | ICD-10-CM | POA: Diagnosis not present

## 2016-05-11 DIAGNOSIS — E1122 Type 2 diabetes mellitus with diabetic chronic kidney disease: Secondary | ICD-10-CM | POA: Diagnosis not present

## 2016-05-11 DIAGNOSIS — N183 Chronic kidney disease, stage 3 (moderate): Secondary | ICD-10-CM | POA: Diagnosis not present

## 2016-05-16 DIAGNOSIS — R2689 Other abnormalities of gait and mobility: Secondary | ICD-10-CM | POA: Diagnosis not present

## 2016-05-16 DIAGNOSIS — S81812D Laceration without foreign body, left lower leg, subsequent encounter: Secondary | ICD-10-CM | POA: Diagnosis not present

## 2016-05-16 DIAGNOSIS — N183 Chronic kidney disease, stage 3 (moderate): Secondary | ICD-10-CM | POA: Diagnosis not present

## 2016-05-16 DIAGNOSIS — D519 Vitamin B12 deficiency anemia, unspecified: Secondary | ICD-10-CM | POA: Diagnosis not present

## 2016-05-16 DIAGNOSIS — E1122 Type 2 diabetes mellitus with diabetic chronic kidney disease: Secondary | ICD-10-CM | POA: Diagnosis not present

## 2016-05-16 DIAGNOSIS — I129 Hypertensive chronic kidney disease with stage 1 through stage 4 chronic kidney disease, or unspecified chronic kidney disease: Secondary | ICD-10-CM | POA: Diagnosis not present

## 2016-05-18 DIAGNOSIS — R2689 Other abnormalities of gait and mobility: Secondary | ICD-10-CM | POA: Diagnosis not present

## 2016-05-18 DIAGNOSIS — D519 Vitamin B12 deficiency anemia, unspecified: Secondary | ICD-10-CM | POA: Diagnosis not present

## 2016-05-18 DIAGNOSIS — E1122 Type 2 diabetes mellitus with diabetic chronic kidney disease: Secondary | ICD-10-CM | POA: Diagnosis not present

## 2016-05-18 DIAGNOSIS — N183 Chronic kidney disease, stage 3 (moderate): Secondary | ICD-10-CM | POA: Diagnosis not present

## 2016-05-18 DIAGNOSIS — S81812D Laceration without foreign body, left lower leg, subsequent encounter: Secondary | ICD-10-CM | POA: Diagnosis not present

## 2016-05-18 DIAGNOSIS — I129 Hypertensive chronic kidney disease with stage 1 through stage 4 chronic kidney disease, or unspecified chronic kidney disease: Secondary | ICD-10-CM | POA: Diagnosis not present

## 2016-05-22 DIAGNOSIS — R2689 Other abnormalities of gait and mobility: Secondary | ICD-10-CM | POA: Diagnosis not present

## 2016-05-22 DIAGNOSIS — N183 Chronic kidney disease, stage 3 (moderate): Secondary | ICD-10-CM | POA: Diagnosis not present

## 2016-05-22 DIAGNOSIS — D519 Vitamin B12 deficiency anemia, unspecified: Secondary | ICD-10-CM | POA: Diagnosis not present

## 2016-05-22 DIAGNOSIS — S81812D Laceration without foreign body, left lower leg, subsequent encounter: Secondary | ICD-10-CM | POA: Diagnosis not present

## 2016-05-22 DIAGNOSIS — E1122 Type 2 diabetes mellitus with diabetic chronic kidney disease: Secondary | ICD-10-CM | POA: Diagnosis not present

## 2016-05-22 DIAGNOSIS — I129 Hypertensive chronic kidney disease with stage 1 through stage 4 chronic kidney disease, or unspecified chronic kidney disease: Secondary | ICD-10-CM | POA: Diagnosis not present

## 2016-05-23 DIAGNOSIS — S81812D Laceration without foreign body, left lower leg, subsequent encounter: Secondary | ICD-10-CM | POA: Diagnosis not present

## 2016-05-23 DIAGNOSIS — R2689 Other abnormalities of gait and mobility: Secondary | ICD-10-CM | POA: Diagnosis not present

## 2016-05-23 DIAGNOSIS — E1122 Type 2 diabetes mellitus with diabetic chronic kidney disease: Secondary | ICD-10-CM | POA: Diagnosis not present

## 2016-05-23 DIAGNOSIS — D519 Vitamin B12 deficiency anemia, unspecified: Secondary | ICD-10-CM | POA: Diagnosis not present

## 2016-05-23 DIAGNOSIS — I129 Hypertensive chronic kidney disease with stage 1 through stage 4 chronic kidney disease, or unspecified chronic kidney disease: Secondary | ICD-10-CM | POA: Diagnosis not present

## 2016-05-23 DIAGNOSIS — N183 Chronic kidney disease, stage 3 (moderate): Secondary | ICD-10-CM | POA: Diagnosis not present

## 2016-05-24 DIAGNOSIS — E1122 Type 2 diabetes mellitus with diabetic chronic kidney disease: Secondary | ICD-10-CM | POA: Diagnosis not present

## 2016-05-24 DIAGNOSIS — I129 Hypertensive chronic kidney disease with stage 1 through stage 4 chronic kidney disease, or unspecified chronic kidney disease: Secondary | ICD-10-CM | POA: Diagnosis not present

## 2016-05-24 DIAGNOSIS — N183 Chronic kidney disease, stage 3 (moderate): Secondary | ICD-10-CM | POA: Diagnosis not present

## 2016-05-24 DIAGNOSIS — S81812D Laceration without foreign body, left lower leg, subsequent encounter: Secondary | ICD-10-CM | POA: Diagnosis not present

## 2016-05-24 DIAGNOSIS — D519 Vitamin B12 deficiency anemia, unspecified: Secondary | ICD-10-CM | POA: Diagnosis not present

## 2016-05-24 DIAGNOSIS — R2689 Other abnormalities of gait and mobility: Secondary | ICD-10-CM | POA: Diagnosis not present

## 2016-05-30 DIAGNOSIS — R599 Enlarged lymph nodes, unspecified: Secondary | ICD-10-CM | POA: Diagnosis not present

## 2016-05-31 DIAGNOSIS — I129 Hypertensive chronic kidney disease with stage 1 through stage 4 chronic kidney disease, or unspecified chronic kidney disease: Secondary | ICD-10-CM | POA: Diagnosis not present

## 2016-05-31 DIAGNOSIS — D519 Vitamin B12 deficiency anemia, unspecified: Secondary | ICD-10-CM | POA: Diagnosis not present

## 2016-05-31 DIAGNOSIS — D649 Anemia, unspecified: Secondary | ICD-10-CM | POA: Diagnosis not present

## 2016-05-31 DIAGNOSIS — E1122 Type 2 diabetes mellitus with diabetic chronic kidney disease: Secondary | ICD-10-CM | POA: Diagnosis not present

## 2016-05-31 DIAGNOSIS — S81812D Laceration without foreign body, left lower leg, subsequent encounter: Secondary | ICD-10-CM | POA: Diagnosis not present

## 2016-05-31 DIAGNOSIS — R599 Enlarged lymph nodes, unspecified: Secondary | ICD-10-CM | POA: Diagnosis not present

## 2016-05-31 DIAGNOSIS — N183 Chronic kidney disease, stage 3 (moderate): Secondary | ICD-10-CM | POA: Diagnosis not present

## 2016-05-31 DIAGNOSIS — C8389 Other non-follicular lymphoma, extranodal and solid organ sites: Secondary | ICD-10-CM | POA: Diagnosis not present

## 2016-05-31 DIAGNOSIS — R2689 Other abnormalities of gait and mobility: Secondary | ICD-10-CM | POA: Diagnosis not present

## 2016-06-01 DIAGNOSIS — R599 Enlarged lymph nodes, unspecified: Secondary | ICD-10-CM | POA: Diagnosis not present

## 2016-06-02 DIAGNOSIS — R590 Localized enlarged lymph nodes: Secondary | ICD-10-CM | POA: Diagnosis not present

## 2016-06-02 DIAGNOSIS — C8390 Non-follicular (diffuse) lymphoma, unspecified, unspecified site: Secondary | ICD-10-CM | POA: Diagnosis not present

## 2016-06-02 DIAGNOSIS — R599 Enlarged lymph nodes, unspecified: Secondary | ICD-10-CM | POA: Diagnosis not present

## 2016-06-02 DIAGNOSIS — R1900 Intra-abdominal and pelvic swelling, mass and lump, unspecified site: Secondary | ICD-10-CM | POA: Diagnosis not present

## 2016-06-02 DIAGNOSIS — R918 Other nonspecific abnormal finding of lung field: Secondary | ICD-10-CM | POA: Diagnosis not present

## 2016-06-06 DIAGNOSIS — C8389 Other non-follicular lymphoma, extranodal and solid organ sites: Secondary | ICD-10-CM | POA: Diagnosis not present

## 2016-06-06 DIAGNOSIS — E538 Deficiency of other specified B group vitamins: Secondary | ICD-10-CM | POA: Diagnosis not present

## 2016-06-07 DIAGNOSIS — N183 Chronic kidney disease, stage 3 (moderate): Secondary | ICD-10-CM | POA: Diagnosis not present

## 2016-06-07 DIAGNOSIS — D519 Vitamin B12 deficiency anemia, unspecified: Secondary | ICD-10-CM | POA: Diagnosis not present

## 2016-06-07 DIAGNOSIS — I129 Hypertensive chronic kidney disease with stage 1 through stage 4 chronic kidney disease, or unspecified chronic kidney disease: Secondary | ICD-10-CM | POA: Diagnosis not present

## 2016-06-07 DIAGNOSIS — R2689 Other abnormalities of gait and mobility: Secondary | ICD-10-CM | POA: Diagnosis not present

## 2016-06-07 DIAGNOSIS — S81812D Laceration without foreign body, left lower leg, subsequent encounter: Secondary | ICD-10-CM | POA: Diagnosis not present

## 2016-06-07 DIAGNOSIS — E1122 Type 2 diabetes mellitus with diabetic chronic kidney disease: Secondary | ICD-10-CM | POA: Diagnosis not present

## 2016-06-08 DIAGNOSIS — E1122 Type 2 diabetes mellitus with diabetic chronic kidney disease: Secondary | ICD-10-CM | POA: Diagnosis not present

## 2016-06-08 DIAGNOSIS — D519 Vitamin B12 deficiency anemia, unspecified: Secondary | ICD-10-CM | POA: Diagnosis not present

## 2016-06-08 DIAGNOSIS — N183 Chronic kidney disease, stage 3 (moderate): Secondary | ICD-10-CM | POA: Diagnosis not present

## 2016-06-08 DIAGNOSIS — R2689 Other abnormalities of gait and mobility: Secondary | ICD-10-CM | POA: Diagnosis not present

## 2016-06-08 DIAGNOSIS — I129 Hypertensive chronic kidney disease with stage 1 through stage 4 chronic kidney disease, or unspecified chronic kidney disease: Secondary | ICD-10-CM | POA: Diagnosis not present

## 2016-06-08 DIAGNOSIS — S81812D Laceration without foreign body, left lower leg, subsequent encounter: Secondary | ICD-10-CM | POA: Diagnosis not present

## 2016-06-09 DIAGNOSIS — I6529 Occlusion and stenosis of unspecified carotid artery: Secondary | ICD-10-CM | POA: Diagnosis not present

## 2016-06-09 DIAGNOSIS — Z86010 Personal history of colonic polyps: Secondary | ICD-10-CM | POA: Diagnosis not present

## 2016-06-09 DIAGNOSIS — N183 Chronic kidney disease, stage 3 (moderate): Secondary | ICD-10-CM | POA: Diagnosis not present

## 2016-06-09 DIAGNOSIS — C495 Malignant neoplasm of connective and soft tissue of pelvis: Secondary | ICD-10-CM | POA: Diagnosis not present

## 2016-06-09 DIAGNOSIS — R1900 Intra-abdominal and pelvic swelling, mass and lump, unspecified site: Secondary | ICD-10-CM | POA: Diagnosis not present

## 2016-06-09 DIAGNOSIS — D649 Anemia, unspecified: Secondary | ICD-10-CM | POA: Diagnosis not present

## 2016-06-09 DIAGNOSIS — E785 Hyperlipidemia, unspecified: Secondary | ICD-10-CM | POA: Diagnosis not present

## 2016-06-09 DIAGNOSIS — I129 Hypertensive chronic kidney disease with stage 1 through stage 4 chronic kidney disease, or unspecified chronic kidney disease: Secondary | ICD-10-CM | POA: Diagnosis not present

## 2016-06-09 DIAGNOSIS — Z01818 Encounter for other preprocedural examination: Secondary | ICD-10-CM | POA: Diagnosis not present

## 2016-06-09 DIAGNOSIS — E119 Type 2 diabetes mellitus without complications: Secondary | ICD-10-CM | POA: Diagnosis not present

## 2016-06-09 DIAGNOSIS — C8590 Non-Hodgkin lymphoma, unspecified, unspecified site: Secondary | ICD-10-CM | POA: Diagnosis not present

## 2016-06-09 DIAGNOSIS — K5792 Diverticulitis of intestine, part unspecified, without perforation or abscess without bleeding: Secondary | ICD-10-CM | POA: Diagnosis not present

## 2016-06-09 DIAGNOSIS — I739 Peripheral vascular disease, unspecified: Secondary | ICD-10-CM | POA: Diagnosis not present

## 2016-06-09 DIAGNOSIS — K219 Gastro-esophageal reflux disease without esophagitis: Secondary | ICD-10-CM | POA: Diagnosis not present

## 2016-06-10 DIAGNOSIS — C8389 Other non-follicular lymphoma, extranodal and solid organ sites: Secondary | ICD-10-CM | POA: Diagnosis not present

## 2016-06-15 DIAGNOSIS — N183 Chronic kidney disease, stage 3 (moderate): Secondary | ICD-10-CM | POA: Diagnosis not present

## 2016-06-15 DIAGNOSIS — R2689 Other abnormalities of gait and mobility: Secondary | ICD-10-CM | POA: Diagnosis not present

## 2016-06-15 DIAGNOSIS — D519 Vitamin B12 deficiency anemia, unspecified: Secondary | ICD-10-CM | POA: Diagnosis not present

## 2016-06-15 DIAGNOSIS — E1122 Type 2 diabetes mellitus with diabetic chronic kidney disease: Secondary | ICD-10-CM | POA: Diagnosis not present

## 2016-06-15 DIAGNOSIS — I129 Hypertensive chronic kidney disease with stage 1 through stage 4 chronic kidney disease, or unspecified chronic kidney disease: Secondary | ICD-10-CM | POA: Diagnosis not present

## 2016-06-15 DIAGNOSIS — S81812D Laceration without foreign body, left lower leg, subsequent encounter: Secondary | ICD-10-CM | POA: Diagnosis not present

## 2016-06-20 DIAGNOSIS — C8389 Other non-follicular lymphoma, extranodal and solid organ sites: Secondary | ICD-10-CM | POA: Diagnosis not present

## 2016-06-21 DIAGNOSIS — D519 Vitamin B12 deficiency anemia, unspecified: Secondary | ICD-10-CM | POA: Diagnosis not present

## 2016-06-21 DIAGNOSIS — S81812D Laceration without foreign body, left lower leg, subsequent encounter: Secondary | ICD-10-CM | POA: Diagnosis not present

## 2016-06-21 DIAGNOSIS — R2689 Other abnormalities of gait and mobility: Secondary | ICD-10-CM | POA: Diagnosis not present

## 2016-06-21 DIAGNOSIS — N183 Chronic kidney disease, stage 3 (moderate): Secondary | ICD-10-CM | POA: Diagnosis not present

## 2016-06-21 DIAGNOSIS — E1122 Type 2 diabetes mellitus with diabetic chronic kidney disease: Secondary | ICD-10-CM | POA: Diagnosis not present

## 2016-06-21 DIAGNOSIS — I129 Hypertensive chronic kidney disease with stage 1 through stage 4 chronic kidney disease, or unspecified chronic kidney disease: Secondary | ICD-10-CM | POA: Diagnosis not present

## 2016-06-28 DIAGNOSIS — I129 Hypertensive chronic kidney disease with stage 1 through stage 4 chronic kidney disease, or unspecified chronic kidney disease: Secondary | ICD-10-CM | POA: Diagnosis not present

## 2016-06-28 DIAGNOSIS — S81812D Laceration without foreign body, left lower leg, subsequent encounter: Secondary | ICD-10-CM | POA: Diagnosis not present

## 2016-06-28 DIAGNOSIS — R2689 Other abnormalities of gait and mobility: Secondary | ICD-10-CM | POA: Diagnosis not present

## 2016-06-28 DIAGNOSIS — E1122 Type 2 diabetes mellitus with diabetic chronic kidney disease: Secondary | ICD-10-CM | POA: Diagnosis not present

## 2016-06-28 DIAGNOSIS — D519 Vitamin B12 deficiency anemia, unspecified: Secondary | ICD-10-CM | POA: Diagnosis not present

## 2016-06-28 DIAGNOSIS — N183 Chronic kidney disease, stage 3 (moderate): Secondary | ICD-10-CM | POA: Diagnosis not present

## 2016-07-05 DIAGNOSIS — D519 Vitamin B12 deficiency anemia, unspecified: Secondary | ICD-10-CM | POA: Diagnosis not present

## 2016-07-05 DIAGNOSIS — N183 Chronic kidney disease, stage 3 (moderate): Secondary | ICD-10-CM | POA: Diagnosis not present

## 2016-07-05 DIAGNOSIS — S81812D Laceration without foreign body, left lower leg, subsequent encounter: Secondary | ICD-10-CM | POA: Diagnosis not present

## 2016-07-05 DIAGNOSIS — E1122 Type 2 diabetes mellitus with diabetic chronic kidney disease: Secondary | ICD-10-CM | POA: Diagnosis not present

## 2016-07-05 DIAGNOSIS — I129 Hypertensive chronic kidney disease with stage 1 through stage 4 chronic kidney disease, or unspecified chronic kidney disease: Secondary | ICD-10-CM | POA: Diagnosis not present

## 2016-07-05 DIAGNOSIS — R2689 Other abnormalities of gait and mobility: Secondary | ICD-10-CM | POA: Diagnosis not present

## 2016-07-06 DIAGNOSIS — E538 Deficiency of other specified B group vitamins: Secondary | ICD-10-CM | POA: Diagnosis not present

## 2016-07-06 DIAGNOSIS — R2689 Other abnormalities of gait and mobility: Secondary | ICD-10-CM | POA: Diagnosis not present

## 2016-07-06 DIAGNOSIS — I129 Hypertensive chronic kidney disease with stage 1 through stage 4 chronic kidney disease, or unspecified chronic kidney disease: Secondary | ICD-10-CM | POA: Diagnosis not present

## 2016-07-06 DIAGNOSIS — D519 Vitamin B12 deficiency anemia, unspecified: Secondary | ICD-10-CM | POA: Diagnosis not present

## 2016-07-06 DIAGNOSIS — E1122 Type 2 diabetes mellitus with diabetic chronic kidney disease: Secondary | ICD-10-CM | POA: Diagnosis not present

## 2016-07-06 DIAGNOSIS — N183 Chronic kidney disease, stage 3 (moderate): Secondary | ICD-10-CM | POA: Diagnosis not present

## 2016-07-06 DIAGNOSIS — S81812D Laceration without foreign body, left lower leg, subsequent encounter: Secondary | ICD-10-CM | POA: Diagnosis not present

## 2016-07-08 DIAGNOSIS — R2689 Other abnormalities of gait and mobility: Secondary | ICD-10-CM | POA: Diagnosis not present

## 2016-07-08 DIAGNOSIS — E1122 Type 2 diabetes mellitus with diabetic chronic kidney disease: Secondary | ICD-10-CM | POA: Diagnosis not present

## 2016-07-08 DIAGNOSIS — D519 Vitamin B12 deficiency anemia, unspecified: Secondary | ICD-10-CM | POA: Diagnosis not present

## 2016-07-08 DIAGNOSIS — I129 Hypertensive chronic kidney disease with stage 1 through stage 4 chronic kidney disease, or unspecified chronic kidney disease: Secondary | ICD-10-CM | POA: Diagnosis not present

## 2016-07-08 DIAGNOSIS — S81812D Laceration without foreign body, left lower leg, subsequent encounter: Secondary | ICD-10-CM | POA: Diagnosis not present

## 2016-07-08 DIAGNOSIS — N183 Chronic kidney disease, stage 3 (moderate): Secondary | ICD-10-CM | POA: Diagnosis not present

## 2016-07-09 DIAGNOSIS — Z87891 Personal history of nicotine dependence: Secondary | ICD-10-CM | POA: Diagnosis not present

## 2016-07-09 DIAGNOSIS — I129 Hypertensive chronic kidney disease with stage 1 through stage 4 chronic kidney disease, or unspecified chronic kidney disease: Secondary | ICD-10-CM | POA: Diagnosis not present

## 2016-07-09 DIAGNOSIS — R1909 Other intra-abdominal and pelvic swelling, mass and lump: Secondary | ICD-10-CM | POA: Diagnosis not present

## 2016-07-09 DIAGNOSIS — E1142 Type 2 diabetes mellitus with diabetic polyneuropathy: Secondary | ICD-10-CM | POA: Diagnosis not present

## 2016-07-09 DIAGNOSIS — M1991 Primary osteoarthritis, unspecified site: Secondary | ICD-10-CM | POA: Diagnosis not present

## 2016-07-09 DIAGNOSIS — D519 Vitamin B12 deficiency anemia, unspecified: Secondary | ICD-10-CM | POA: Diagnosis not present

## 2016-07-09 DIAGNOSIS — N183 Chronic kidney disease, stage 3 (moderate): Secondary | ICD-10-CM | POA: Diagnosis not present

## 2016-07-09 DIAGNOSIS — R2689 Other abnormalities of gait and mobility: Secondary | ICD-10-CM | POA: Diagnosis not present

## 2016-07-09 DIAGNOSIS — F339 Major depressive disorder, recurrent, unspecified: Secondary | ICD-10-CM | POA: Diagnosis not present

## 2016-07-09 DIAGNOSIS — Z7984 Long term (current) use of oral hypoglycemic drugs: Secondary | ICD-10-CM | POA: Diagnosis not present

## 2016-07-09 DIAGNOSIS — E1122 Type 2 diabetes mellitus with diabetic chronic kidney disease: Secondary | ICD-10-CM | POA: Diagnosis not present

## 2016-07-09 DIAGNOSIS — I2510 Atherosclerotic heart disease of native coronary artery without angina pectoris: Secondary | ICD-10-CM | POA: Diagnosis not present

## 2016-07-10 DIAGNOSIS — R1909 Other intra-abdominal and pelvic swelling, mass and lump: Secondary | ICD-10-CM | POA: Diagnosis not present

## 2016-07-10 DIAGNOSIS — I129 Hypertensive chronic kidney disease with stage 1 through stage 4 chronic kidney disease, or unspecified chronic kidney disease: Secondary | ICD-10-CM | POA: Diagnosis not present

## 2016-07-10 DIAGNOSIS — I2510 Atherosclerotic heart disease of native coronary artery without angina pectoris: Secondary | ICD-10-CM | POA: Diagnosis not present

## 2016-07-10 DIAGNOSIS — E1122 Type 2 diabetes mellitus with diabetic chronic kidney disease: Secondary | ICD-10-CM | POA: Diagnosis not present

## 2016-07-10 DIAGNOSIS — R2689 Other abnormalities of gait and mobility: Secondary | ICD-10-CM | POA: Diagnosis not present

## 2016-07-10 DIAGNOSIS — D519 Vitamin B12 deficiency anemia, unspecified: Secondary | ICD-10-CM | POA: Diagnosis not present

## 2016-07-11 DIAGNOSIS — D519 Vitamin B12 deficiency anemia, unspecified: Secondary | ICD-10-CM | POA: Diagnosis not present

## 2016-07-11 DIAGNOSIS — E1122 Type 2 diabetes mellitus with diabetic chronic kidney disease: Secondary | ICD-10-CM | POA: Diagnosis not present

## 2016-07-11 DIAGNOSIS — R2689 Other abnormalities of gait and mobility: Secondary | ICD-10-CM | POA: Diagnosis not present

## 2016-07-11 DIAGNOSIS — I2510 Atherosclerotic heart disease of native coronary artery without angina pectoris: Secondary | ICD-10-CM | POA: Diagnosis not present

## 2016-07-11 DIAGNOSIS — I129 Hypertensive chronic kidney disease with stage 1 through stage 4 chronic kidney disease, or unspecified chronic kidney disease: Secondary | ICD-10-CM | POA: Diagnosis not present

## 2016-07-11 DIAGNOSIS — R1909 Other intra-abdominal and pelvic swelling, mass and lump: Secondary | ICD-10-CM | POA: Diagnosis not present

## 2016-07-13 DIAGNOSIS — I129 Hypertensive chronic kidney disease with stage 1 through stage 4 chronic kidney disease, or unspecified chronic kidney disease: Secondary | ICD-10-CM | POA: Diagnosis not present

## 2016-07-13 DIAGNOSIS — I2510 Atherosclerotic heart disease of native coronary artery without angina pectoris: Secondary | ICD-10-CM | POA: Diagnosis not present

## 2016-07-13 DIAGNOSIS — D519 Vitamin B12 deficiency anemia, unspecified: Secondary | ICD-10-CM | POA: Diagnosis not present

## 2016-07-13 DIAGNOSIS — E1122 Type 2 diabetes mellitus with diabetic chronic kidney disease: Secondary | ICD-10-CM | POA: Diagnosis not present

## 2016-07-13 DIAGNOSIS — R2689 Other abnormalities of gait and mobility: Secondary | ICD-10-CM | POA: Diagnosis not present

## 2016-07-13 DIAGNOSIS — R1909 Other intra-abdominal and pelvic swelling, mass and lump: Secondary | ICD-10-CM | POA: Diagnosis not present

## 2016-07-14 DIAGNOSIS — C8389 Other non-follicular lymphoma, extranodal and solid organ sites: Secondary | ICD-10-CM | POA: Diagnosis not present

## 2016-07-14 DIAGNOSIS — D649 Anemia, unspecified: Secondary | ICD-10-CM | POA: Diagnosis not present

## 2016-07-16 DIAGNOSIS — K219 Gastro-esophageal reflux disease without esophagitis: Secondary | ICD-10-CM | POA: Diagnosis not present

## 2016-07-16 DIAGNOSIS — I10 Essential (primary) hypertension: Secondary | ICD-10-CM | POA: Diagnosis not present

## 2016-07-16 DIAGNOSIS — I6529 Occlusion and stenosis of unspecified carotid artery: Secondary | ICD-10-CM | POA: Diagnosis not present

## 2016-07-16 DIAGNOSIS — M797 Fibromyalgia: Secondary | ICD-10-CM | POA: Diagnosis not present

## 2016-07-16 DIAGNOSIS — C495 Malignant neoplasm of connective and soft tissue of pelvis: Secondary | ICD-10-CM | POA: Diagnosis not present

## 2016-07-16 DIAGNOSIS — Z8673 Personal history of transient ischemic attack (TIA), and cerebral infarction without residual deficits: Secondary | ICD-10-CM | POA: Diagnosis not present

## 2016-07-16 DIAGNOSIS — E119 Type 2 diabetes mellitus without complications: Secondary | ICD-10-CM | POA: Diagnosis not present

## 2016-07-16 DIAGNOSIS — E785 Hyperlipidemia, unspecified: Secondary | ICD-10-CM | POA: Diagnosis not present

## 2016-07-16 DIAGNOSIS — I739 Peripheral vascular disease, unspecified: Secondary | ICD-10-CM | POA: Diagnosis not present

## 2016-07-16 DIAGNOSIS — N189 Chronic kidney disease, unspecified: Secondary | ICD-10-CM | POA: Diagnosis not present

## 2016-07-16 DIAGNOSIS — D649 Anemia, unspecified: Secondary | ICD-10-CM | POA: Diagnosis not present

## 2016-07-16 DIAGNOSIS — C8510 Unspecified B-cell lymphoma, unspecified site: Secondary | ICD-10-CM | POA: Diagnosis not present

## 2016-07-16 DIAGNOSIS — F418 Other specified anxiety disorders: Secondary | ICD-10-CM | POA: Diagnosis not present

## 2016-07-16 DIAGNOSIS — R1900 Intra-abdominal and pelvic swelling, mass and lump, unspecified site: Secondary | ICD-10-CM | POA: Diagnosis not present

## 2016-07-16 DIAGNOSIS — R590 Localized enlarged lymph nodes: Secondary | ICD-10-CM | POA: Diagnosis not present

## 2016-07-17 DIAGNOSIS — C495 Malignant neoplasm of connective and soft tissue of pelvis: Secondary | ICD-10-CM | POA: Diagnosis not present

## 2016-07-17 DIAGNOSIS — R590 Localized enlarged lymph nodes: Secondary | ICD-10-CM | POA: Diagnosis not present

## 2016-07-17 DIAGNOSIS — R1900 Intra-abdominal and pelvic swelling, mass and lump, unspecified site: Secondary | ICD-10-CM | POA: Diagnosis not present

## 2016-07-17 DIAGNOSIS — F418 Other specified anxiety disorders: Secondary | ICD-10-CM | POA: Diagnosis not present

## 2016-07-17 DIAGNOSIS — C8510 Unspecified B-cell lymphoma, unspecified site: Secondary | ICD-10-CM | POA: Diagnosis not present

## 2016-07-17 DIAGNOSIS — D649 Anemia, unspecified: Secondary | ICD-10-CM | POA: Diagnosis not present

## 2016-07-18 DIAGNOSIS — R1900 Intra-abdominal and pelvic swelling, mass and lump, unspecified site: Secondary | ICD-10-CM | POA: Diagnosis not present

## 2016-07-18 DIAGNOSIS — R590 Localized enlarged lymph nodes: Secondary | ICD-10-CM | POA: Diagnosis not present

## 2016-07-18 DIAGNOSIS — F418 Other specified anxiety disorders: Secondary | ICD-10-CM | POA: Diagnosis not present

## 2016-07-18 DIAGNOSIS — C495 Malignant neoplasm of connective and soft tissue of pelvis: Secondary | ICD-10-CM | POA: Diagnosis not present

## 2016-07-18 DIAGNOSIS — D649 Anemia, unspecified: Secondary | ICD-10-CM | POA: Diagnosis not present

## 2016-07-18 DIAGNOSIS — C8510 Unspecified B-cell lymphoma, unspecified site: Secondary | ICD-10-CM | POA: Diagnosis not present

## 2016-07-20 DIAGNOSIS — C495 Malignant neoplasm of connective and soft tissue of pelvis: Secondary | ICD-10-CM | POA: Diagnosis not present

## 2016-07-20 DIAGNOSIS — F418 Other specified anxiety disorders: Secondary | ICD-10-CM | POA: Diagnosis not present

## 2016-07-20 DIAGNOSIS — R590 Localized enlarged lymph nodes: Secondary | ICD-10-CM | POA: Diagnosis not present

## 2016-07-20 DIAGNOSIS — C8510 Unspecified B-cell lymphoma, unspecified site: Secondary | ICD-10-CM | POA: Diagnosis not present

## 2016-07-20 DIAGNOSIS — D649 Anemia, unspecified: Secondary | ICD-10-CM | POA: Diagnosis not present

## 2016-07-20 DIAGNOSIS — R1900 Intra-abdominal and pelvic swelling, mass and lump, unspecified site: Secondary | ICD-10-CM | POA: Diagnosis not present

## 2016-07-21 DIAGNOSIS — C8510 Unspecified B-cell lymphoma, unspecified site: Secondary | ICD-10-CM | POA: Diagnosis not present

## 2016-07-21 DIAGNOSIS — D649 Anemia, unspecified: Secondary | ICD-10-CM | POA: Diagnosis not present

## 2016-07-21 DIAGNOSIS — R590 Localized enlarged lymph nodes: Secondary | ICD-10-CM | POA: Diagnosis not present

## 2016-07-21 DIAGNOSIS — F418 Other specified anxiety disorders: Secondary | ICD-10-CM | POA: Diagnosis not present

## 2016-07-21 DIAGNOSIS — C495 Malignant neoplasm of connective and soft tissue of pelvis: Secondary | ICD-10-CM | POA: Diagnosis not present

## 2016-07-21 DIAGNOSIS — R1900 Intra-abdominal and pelvic swelling, mass and lump, unspecified site: Secondary | ICD-10-CM | POA: Diagnosis not present

## 2016-07-22 DIAGNOSIS — C8510 Unspecified B-cell lymphoma, unspecified site: Secondary | ICD-10-CM | POA: Diagnosis not present

## 2016-07-22 DIAGNOSIS — R590 Localized enlarged lymph nodes: Secondary | ICD-10-CM | POA: Diagnosis not present

## 2016-07-22 DIAGNOSIS — C495 Malignant neoplasm of connective and soft tissue of pelvis: Secondary | ICD-10-CM | POA: Diagnosis not present

## 2016-07-22 DIAGNOSIS — D649 Anemia, unspecified: Secondary | ICD-10-CM | POA: Diagnosis not present

## 2016-07-22 DIAGNOSIS — F418 Other specified anxiety disorders: Secondary | ICD-10-CM | POA: Diagnosis not present

## 2016-07-22 DIAGNOSIS — R1900 Intra-abdominal and pelvic swelling, mass and lump, unspecified site: Secondary | ICD-10-CM | POA: Diagnosis not present

## 2016-07-24 DIAGNOSIS — F418 Other specified anxiety disorders: Secondary | ICD-10-CM | POA: Diagnosis not present

## 2016-07-24 DIAGNOSIS — D649 Anemia, unspecified: Secondary | ICD-10-CM | POA: Diagnosis not present

## 2016-07-24 DIAGNOSIS — R1900 Intra-abdominal and pelvic swelling, mass and lump, unspecified site: Secondary | ICD-10-CM | POA: Diagnosis not present

## 2016-07-24 DIAGNOSIS — R590 Localized enlarged lymph nodes: Secondary | ICD-10-CM | POA: Diagnosis not present

## 2016-07-24 DIAGNOSIS — C495 Malignant neoplasm of connective and soft tissue of pelvis: Secondary | ICD-10-CM | POA: Diagnosis not present

## 2016-07-24 DIAGNOSIS — C8510 Unspecified B-cell lymphoma, unspecified site: Secondary | ICD-10-CM | POA: Diagnosis not present

## 2016-07-25 DIAGNOSIS — D649 Anemia, unspecified: Secondary | ICD-10-CM | POA: Diagnosis not present

## 2016-07-25 DIAGNOSIS — F418 Other specified anxiety disorders: Secondary | ICD-10-CM | POA: Diagnosis not present

## 2016-07-25 DIAGNOSIS — C495 Malignant neoplasm of connective and soft tissue of pelvis: Secondary | ICD-10-CM | POA: Diagnosis not present

## 2016-07-25 DIAGNOSIS — C8510 Unspecified B-cell lymphoma, unspecified site: Secondary | ICD-10-CM | POA: Diagnosis not present

## 2016-07-25 DIAGNOSIS — R590 Localized enlarged lymph nodes: Secondary | ICD-10-CM | POA: Diagnosis not present

## 2016-07-25 DIAGNOSIS — R1900 Intra-abdominal and pelvic swelling, mass and lump, unspecified site: Secondary | ICD-10-CM | POA: Diagnosis not present

## 2016-07-27 DIAGNOSIS — F418 Other specified anxiety disorders: Secondary | ICD-10-CM | POA: Diagnosis not present

## 2016-07-27 DIAGNOSIS — R590 Localized enlarged lymph nodes: Secondary | ICD-10-CM | POA: Diagnosis not present

## 2016-07-27 DIAGNOSIS — R1900 Intra-abdominal and pelvic swelling, mass and lump, unspecified site: Secondary | ICD-10-CM | POA: Diagnosis not present

## 2016-07-27 DIAGNOSIS — D649 Anemia, unspecified: Secondary | ICD-10-CM | POA: Diagnosis not present

## 2016-07-27 DIAGNOSIS — C8510 Unspecified B-cell lymphoma, unspecified site: Secondary | ICD-10-CM | POA: Diagnosis not present

## 2016-07-27 DIAGNOSIS — C495 Malignant neoplasm of connective and soft tissue of pelvis: Secondary | ICD-10-CM | POA: Diagnosis not present

## 2016-07-29 DIAGNOSIS — C495 Malignant neoplasm of connective and soft tissue of pelvis: Secondary | ICD-10-CM | POA: Diagnosis not present

## 2016-07-29 DIAGNOSIS — C8510 Unspecified B-cell lymphoma, unspecified site: Secondary | ICD-10-CM | POA: Diagnosis not present

## 2016-07-29 DIAGNOSIS — F418 Other specified anxiety disorders: Secondary | ICD-10-CM | POA: Diagnosis not present

## 2016-07-29 DIAGNOSIS — R1900 Intra-abdominal and pelvic swelling, mass and lump, unspecified site: Secondary | ICD-10-CM | POA: Diagnosis not present

## 2016-07-29 DIAGNOSIS — R590 Localized enlarged lymph nodes: Secondary | ICD-10-CM | POA: Diagnosis not present

## 2016-07-29 DIAGNOSIS — D649 Anemia, unspecified: Secondary | ICD-10-CM | POA: Diagnosis not present

## 2016-07-30 DIAGNOSIS — C495 Malignant neoplasm of connective and soft tissue of pelvis: Secondary | ICD-10-CM | POA: Diagnosis not present

## 2016-07-30 DIAGNOSIS — F418 Other specified anxiety disorders: Secondary | ICD-10-CM | POA: Diagnosis not present

## 2016-07-30 DIAGNOSIS — R590 Localized enlarged lymph nodes: Secondary | ICD-10-CM | POA: Diagnosis not present

## 2016-07-30 DIAGNOSIS — R1900 Intra-abdominal and pelvic swelling, mass and lump, unspecified site: Secondary | ICD-10-CM | POA: Diagnosis not present

## 2016-07-30 DIAGNOSIS — C8510 Unspecified B-cell lymphoma, unspecified site: Secondary | ICD-10-CM | POA: Diagnosis not present

## 2016-07-30 DIAGNOSIS — D649 Anemia, unspecified: Secondary | ICD-10-CM | POA: Diagnosis not present

## 2016-07-31 DIAGNOSIS — R1900 Intra-abdominal and pelvic swelling, mass and lump, unspecified site: Secondary | ICD-10-CM | POA: Diagnosis not present

## 2016-07-31 DIAGNOSIS — C8510 Unspecified B-cell lymphoma, unspecified site: Secondary | ICD-10-CM | POA: Diagnosis not present

## 2016-07-31 DIAGNOSIS — D649 Anemia, unspecified: Secondary | ICD-10-CM | POA: Diagnosis not present

## 2016-07-31 DIAGNOSIS — R590 Localized enlarged lymph nodes: Secondary | ICD-10-CM | POA: Diagnosis not present

## 2016-07-31 DIAGNOSIS — F418 Other specified anxiety disorders: Secondary | ICD-10-CM | POA: Diagnosis not present

## 2016-07-31 DIAGNOSIS — C495 Malignant neoplasm of connective and soft tissue of pelvis: Secondary | ICD-10-CM | POA: Diagnosis not present

## 2016-08-01 DIAGNOSIS — D649 Anemia, unspecified: Secondary | ICD-10-CM | POA: Diagnosis not present

## 2016-08-01 DIAGNOSIS — C8510 Unspecified B-cell lymphoma, unspecified site: Secondary | ICD-10-CM | POA: Diagnosis not present

## 2016-08-01 DIAGNOSIS — R590 Localized enlarged lymph nodes: Secondary | ICD-10-CM | POA: Diagnosis not present

## 2016-08-01 DIAGNOSIS — C495 Malignant neoplasm of connective and soft tissue of pelvis: Secondary | ICD-10-CM | POA: Diagnosis not present

## 2016-08-01 DIAGNOSIS — F418 Other specified anxiety disorders: Secondary | ICD-10-CM | POA: Diagnosis not present

## 2016-08-01 DIAGNOSIS — R1900 Intra-abdominal and pelvic swelling, mass and lump, unspecified site: Secondary | ICD-10-CM | POA: Diagnosis not present

## 2016-08-03 DEATH — deceased

## 2016-12-29 IMAGING — CT CT HEAD W/O CM
4 series · 16 of 47 positions shown, 18 images · non-contrast
Comparison: CT of the head and MRI of the brain performed
02/04/2014

CLINICAL DATA: Status post fall, with bruising about the right
orbit. Initial encounter.

EXAM:
CT HEAD WITHOUT CONTRAST
TECHNIQUE: Contiguous axial images were obtained from the base of the skull
through the vertex without intravenous contrast.

[Series 2: head wo · axial · 0.39mm/px · z∈[+819,+914]mm · 7 of 27 slices shown, 9 images]
[im 4/27  brain]
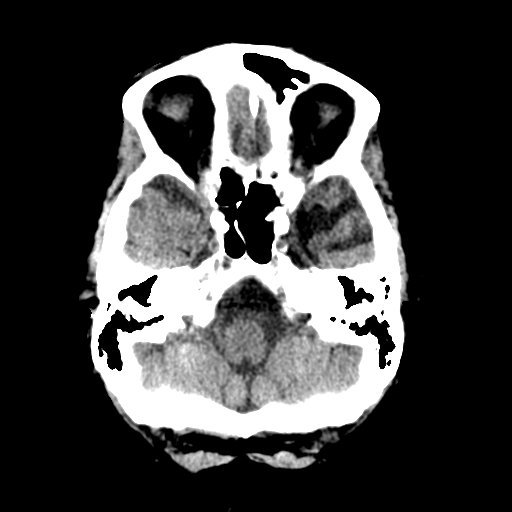
[im 4/27  bone]
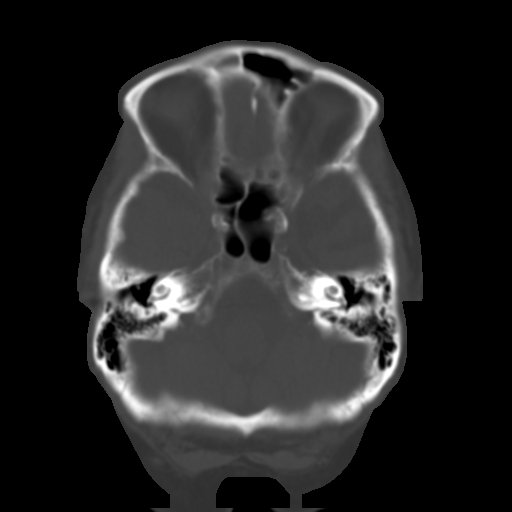
[im 7/27  brain]
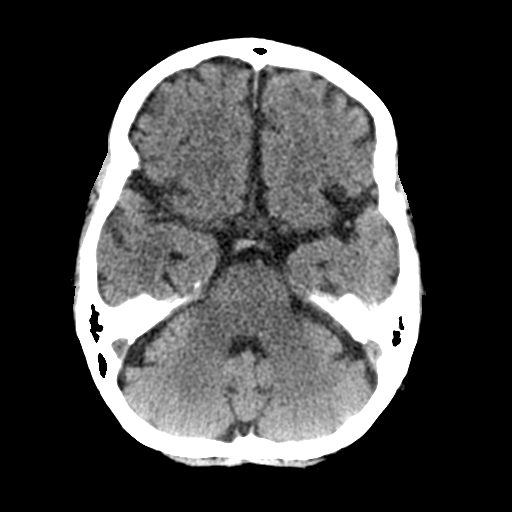
[im 10/27  brain]
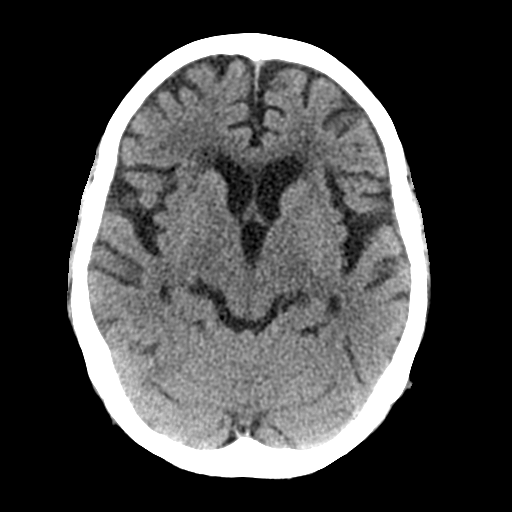
[im 14/27  brain]
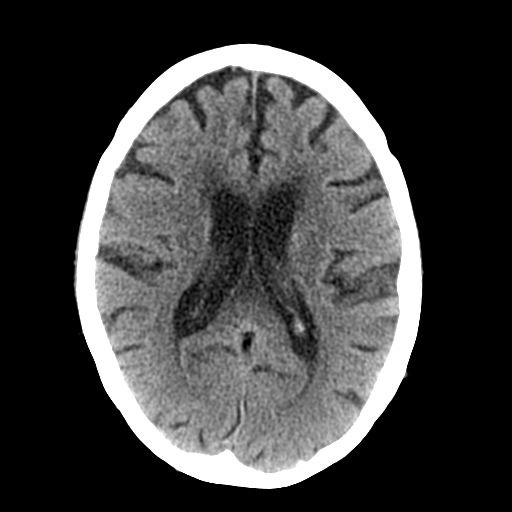
[im 17/27  brain]
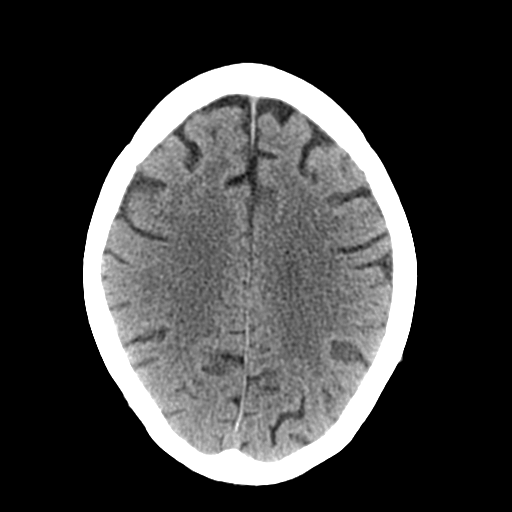
[im 17/27  bone]
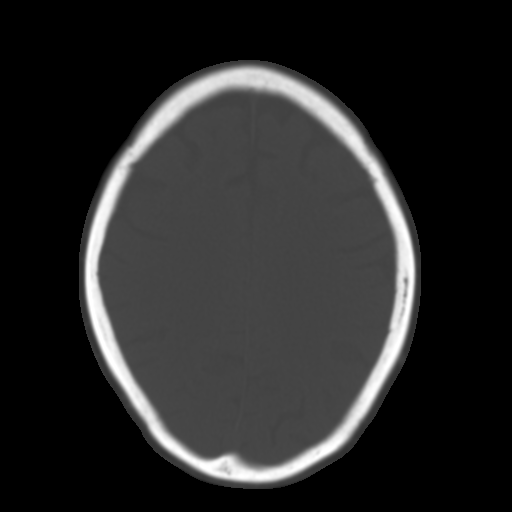
[im 20/27  brain]
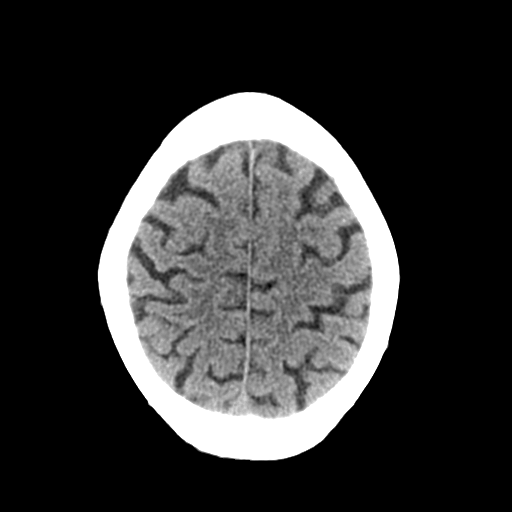
[im 23/27  brain]
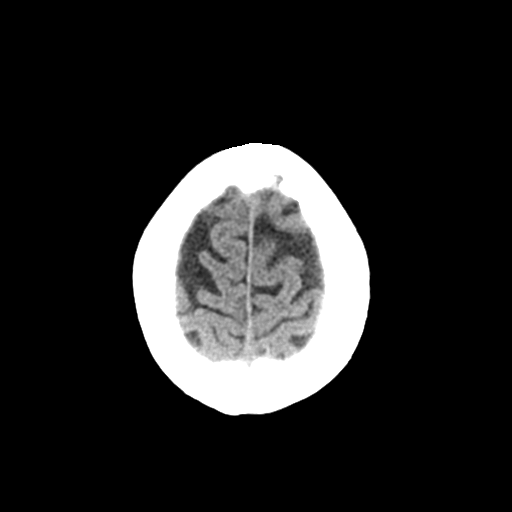

[Series 3: head bone · axial · 0.39mm/px · z∈[+816,+842]mm · 3 of 67 slices shown]
[im 7/67  bone]
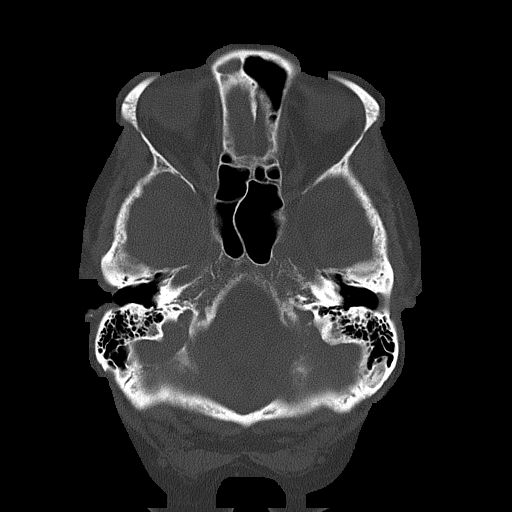
[im 14/67  bone]
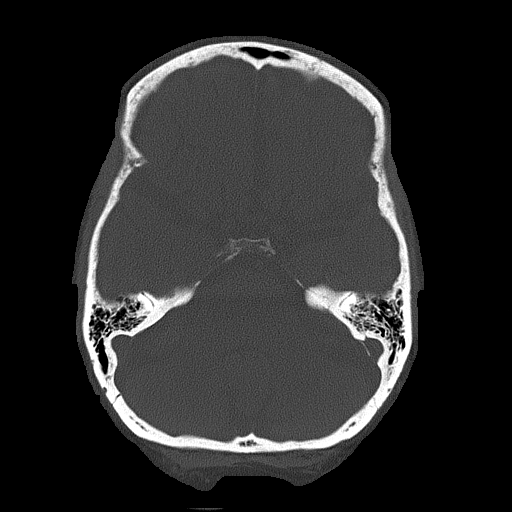
[im 20/67  bone]
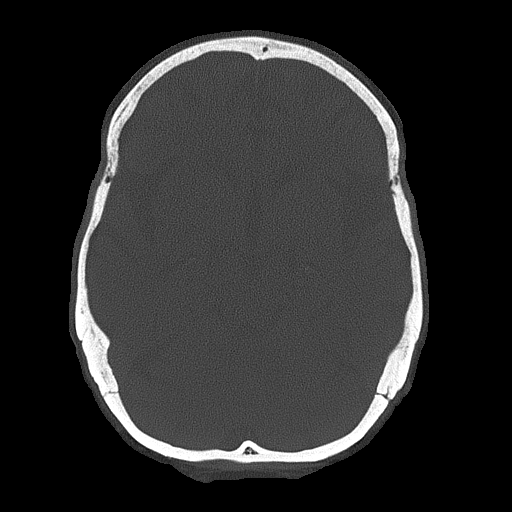

[Series 4: coronal soft tissue · coronal · 0.29mm/px · 3 of 61 slices shown]
[im 21/61  brain]
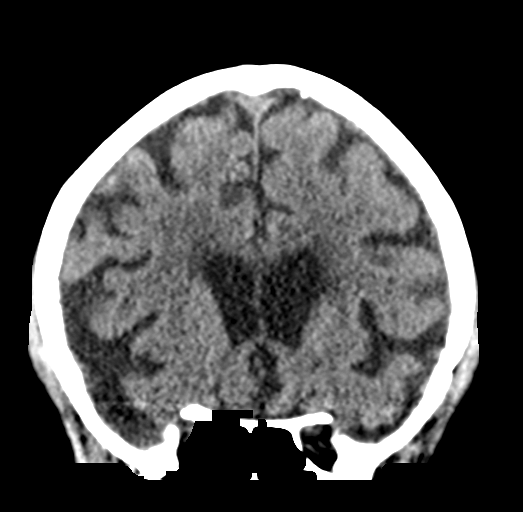
[im 27/61  brain]
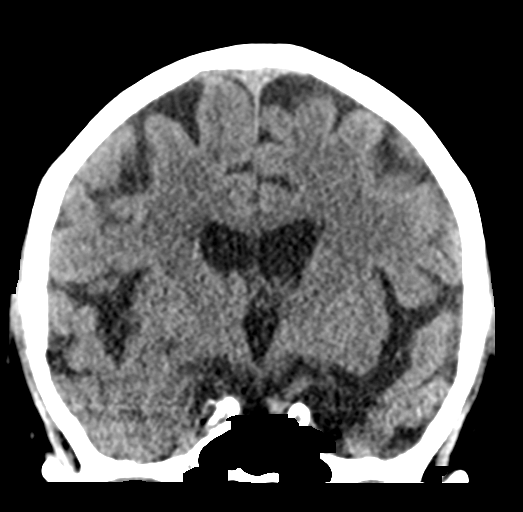
[im 34/61  brain]
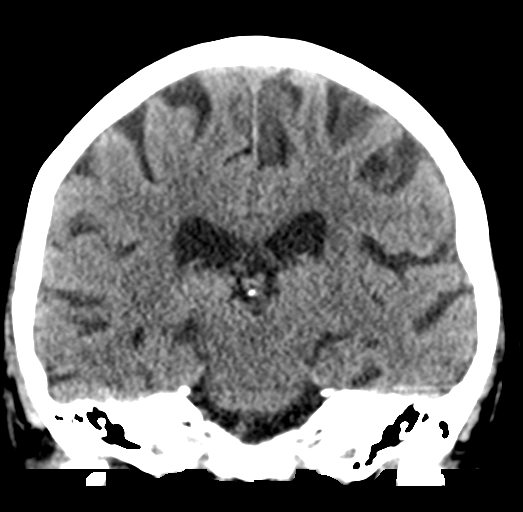

[Series 5: sagittal soft tissue · sagittal · 0.27mm/px · 3 of 50 slices shown]
[im 17/50  brain]
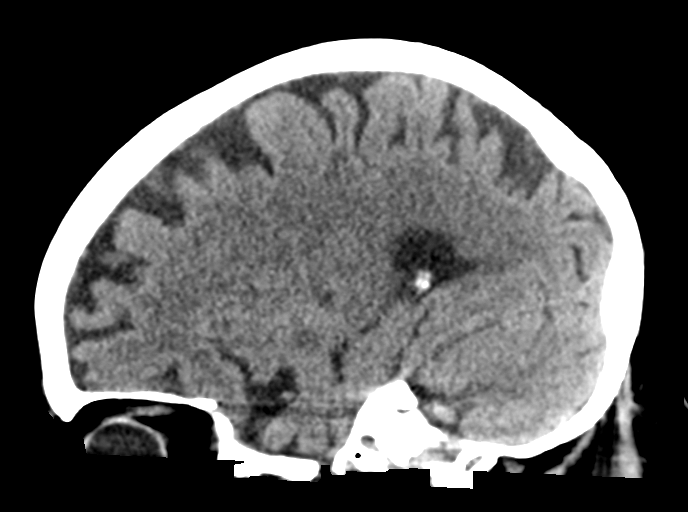
[im 25/50  brain]
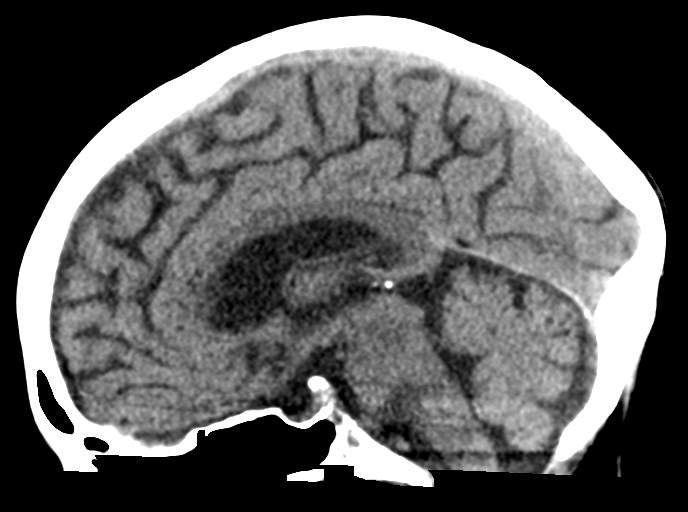
[im 33/50  brain]
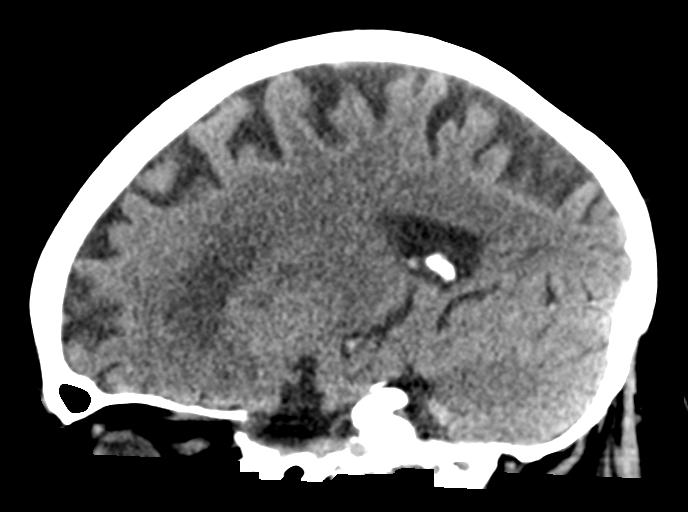

[16 of 47 positions shown; findings below may reference images not displayed]

FINDINGS: Brain: No evidence of acute infarction, hemorrhage, hydrocephalus,
extra-axial collection or mass lesion/mass effect.

Prominence of the ventricles and sulci reflects mild to moderate
cortical volume loss. Mild cerebellar atrophy is noted. Scattered
periventricular white matter change likely reflects small vessel
ischemic microangiopathy.

The brainstem and fourth ventricle are within normal limits. The
basal ganglia are unremarkable in appearance. The cerebral
hemispheres demonstrate grossly normal gray-white differentiation.
No mass effect or midline shift is seen.

Vascular: No hyperdense vessel or unexpected calcification.

Skull: There is no evidence of fracture; visualized osseous
structures are unremarkable in appearance.

Sinuses/Orbits: The visualized portions of the orbits are within
normal limits. The paranasal sinuses and mastoid air cells are
well-aerated.

Other: Minimal soft tissue swelling is suggested overlying the right
orbit.
IMPRESSION: 1. No evidence of traumatic intracranial injury or fracture.
2. Minimal soft tissue swelling overlying the right orbit.
3. Mild to moderate cortical volume loss and scattered small vessel
ischemic microangiopathy.
